# Patient Record
Sex: Female | Born: 1965 | Race: Black or African American | Hispanic: No | Marital: Single | Smoking: Former smoker
Health system: Southern US, Community
[De-identification: ages and names within clinical notes are randomized; demographics above are authoritative.]

## PROBLEM LIST (undated history)

## (undated) DIAGNOSIS — K589 Irritable bowel syndrome without diarrhea: Secondary | ICD-10-CM

## (undated) DIAGNOSIS — F17200 Nicotine dependence, unspecified, uncomplicated: Secondary | ICD-10-CM

## (undated) DIAGNOSIS — K469 Unspecified abdominal hernia without obstruction or gangrene: Secondary | ICD-10-CM

## (undated) DIAGNOSIS — K219 Gastro-esophageal reflux disease without esophagitis: Secondary | ICD-10-CM

## (undated) DIAGNOSIS — F1210 Cannabis abuse, uncomplicated: Secondary | ICD-10-CM

## (undated) DIAGNOSIS — J309 Allergic rhinitis, unspecified: Secondary | ICD-10-CM

## (undated) DIAGNOSIS — F329 Major depressive disorder, single episode, unspecified: Secondary | ICD-10-CM

## (undated) DIAGNOSIS — G8929 Other chronic pain: Secondary | ICD-10-CM

## (undated) DIAGNOSIS — Z970 Presence of artificial eye: Secondary | ICD-10-CM

## (undated) DIAGNOSIS — K649 Unspecified hemorrhoids: Secondary | ICD-10-CM

## (undated) DIAGNOSIS — F32A Depression, unspecified: Secondary | ICD-10-CM

## (undated) DIAGNOSIS — M5410 Radiculopathy, site unspecified: Secondary | ICD-10-CM

## (undated) DIAGNOSIS — G4700 Insomnia, unspecified: Secondary | ICD-10-CM

## (undated) DIAGNOSIS — R109 Unspecified abdominal pain: Secondary | ICD-10-CM

## (undated) DIAGNOSIS — E049 Nontoxic goiter, unspecified: Secondary | ICD-10-CM

## (undated) HISTORY — DX: Unspecified abdominal pain: R10.9

## (undated) HISTORY — DX: Nontoxic goiter, unspecified: E04.9

## (undated) HISTORY — DX: Radiculopathy, site unspecified: M54.10

## (undated) HISTORY — DX: Insomnia, unspecified: G47.00

## (undated) HISTORY — DX: Depression, unspecified: F32.A

## (undated) HISTORY — DX: Nicotine dependence, unspecified, uncomplicated: F17.200

## (undated) HISTORY — DX: Unspecified hemorrhoids: K64.9

## (undated) HISTORY — DX: Allergic rhinitis, unspecified: J30.9

## (undated) HISTORY — DX: Presence of artificial eye: Z97.0

## (undated) HISTORY — DX: Major depressive disorder, single episode, unspecified: F32.9

## (undated) HISTORY — DX: Other chronic pain: G89.29

## (undated) HISTORY — DX: Gastro-esophageal reflux disease without esophagitis: K21.9

## (undated) HISTORY — DX: Cannabis abuse, uncomplicated: F12.10

## (undated) HISTORY — DX: Irritable bowel syndrome without diarrhea: K58.9

---
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
## 1997-04-04 ENCOUNTER — Ambulatory Visit (HOSPITAL_COMMUNITY): Admission: RE | Admit: 1997-04-04 | Discharge: 1997-04-04 | Payer: Self-pay | Admitting: *Deleted

## 1997-06-25 ENCOUNTER — Ambulatory Visit (HOSPITAL_COMMUNITY): Admission: RE | Admit: 1997-06-25 | Discharge: 1997-06-25 | Payer: Self-pay | Admitting: *Deleted

## 1997-07-12 ENCOUNTER — Ambulatory Visit (HOSPITAL_COMMUNITY): Admission: RE | Admit: 1997-07-12 | Discharge: 1997-07-12 | Payer: Self-pay | Admitting: *Deleted

## 1997-09-20 ENCOUNTER — Inpatient Hospital Stay (HOSPITAL_COMMUNITY): Admission: AD | Admit: 1997-09-20 | Discharge: 1997-09-20 | Payer: Self-pay | Admitting: *Deleted

## 1997-09-24 ENCOUNTER — Inpatient Hospital Stay (HOSPITAL_COMMUNITY): Admission: AD | Admit: 1997-09-24 | Discharge: 1997-09-24 | Payer: Self-pay | Admitting: *Deleted

## 1997-09-25 ENCOUNTER — Inpatient Hospital Stay (HOSPITAL_COMMUNITY): Admission: AD | Admit: 1997-09-25 | Discharge: 1997-09-28 | Payer: Self-pay | Admitting: *Deleted

## 1999-11-11 ENCOUNTER — Emergency Department (HOSPITAL_COMMUNITY): Admission: EM | Admit: 1999-11-11 | Discharge: 1999-11-11 | Payer: Self-pay | Admitting: Emergency Medicine

## 2002-07-31 ENCOUNTER — Emergency Department (HOSPITAL_COMMUNITY): Admission: EM | Admit: 2002-07-31 | Discharge: 2002-07-31 | Payer: Self-pay | Admitting: Emergency Medicine

## 2002-07-31 ENCOUNTER — Encounter: Payer: Self-pay | Admitting: Emergency Medicine

## 2003-01-06 ENCOUNTER — Inpatient Hospital Stay (HOSPITAL_COMMUNITY): Admission: AD | Admit: 2003-01-06 | Discharge: 2003-01-06 | Payer: Self-pay | Admitting: Family Medicine

## 2003-01-29 ENCOUNTER — Observation Stay (HOSPITAL_COMMUNITY): Admission: AD | Admit: 2003-01-29 | Discharge: 2003-01-30 | Payer: Self-pay | Admitting: *Deleted

## 2003-01-29 ENCOUNTER — Encounter (INDEPENDENT_AMBULATORY_CARE_PROVIDER_SITE_OTHER): Payer: Self-pay | Admitting: Specialist

## 2003-02-13 ENCOUNTER — Encounter: Admission: RE | Admit: 2003-02-13 | Discharge: 2003-02-13 | Payer: Self-pay | Admitting: Obstetrics and Gynecology

## 2003-02-20 ENCOUNTER — Encounter: Admission: RE | Admit: 2003-02-20 | Discharge: 2003-02-20 | Payer: Self-pay | Admitting: Obstetrics and Gynecology

## 2003-04-23 ENCOUNTER — Encounter: Admission: RE | Admit: 2003-04-23 | Discharge: 2003-04-23 | Payer: Self-pay | Admitting: Obstetrics and Gynecology

## 2003-10-31 ENCOUNTER — Emergency Department (HOSPITAL_COMMUNITY): Admission: EM | Admit: 2003-10-31 | Discharge: 2003-10-31 | Payer: Self-pay | Admitting: Emergency Medicine

## 2003-11-02 ENCOUNTER — Inpatient Hospital Stay (HOSPITAL_COMMUNITY): Admission: AD | Admit: 2003-11-02 | Discharge: 2003-11-02 | Payer: Self-pay | Admitting: Obstetrics & Gynecology

## 2004-09-17 ENCOUNTER — Emergency Department (HOSPITAL_COMMUNITY): Admission: EM | Admit: 2004-09-17 | Discharge: 2004-09-17 | Payer: Self-pay | Admitting: Emergency Medicine

## 2004-09-24 ENCOUNTER — Ambulatory Visit: Payer: Self-pay | Admitting: Internal Medicine

## 2004-10-29 ENCOUNTER — Ambulatory Visit: Payer: Self-pay | Admitting: Internal Medicine

## 2004-10-31 ENCOUNTER — Ambulatory Visit: Payer: Self-pay | Admitting: Internal Medicine

## 2004-11-13 ENCOUNTER — Ambulatory Visit (HOSPITAL_COMMUNITY): Admission: RE | Admit: 2004-11-13 | Discharge: 2004-11-13 | Payer: Self-pay | Admitting: *Deleted

## 2004-12-24 ENCOUNTER — Ambulatory Visit: Payer: Self-pay | Admitting: Internal Medicine

## 2005-01-11 IMAGING — US US PELVIS COMPLETE MODIFY
1 series · 14 of 25 positions shown · non-contrast
Comparison: none

CLINICAL DATA: Pelvic pain.
 TRANSABDOMINAL AND ENDOVAGINAL PELVIC ULTRASOUND:
 Uterus is retroflexed and measures 8.0 x 4.3 x 4.7 cm.  The endometrial stripe is 2 ? 3 mm in thickness.  Myometrial echotexture is homogeneous.  
 The right ovary measures 3.0 x 1.9 x 2.9 cm.  The left ovary measures 2.8 x 1.8 x 2.9 cm.  No free fluid is identified in the cul-de-sac.

[Series 1: us pelvis complete modify · 0.29mm/px · 14 of 55 slices shown]
[im 1/55]
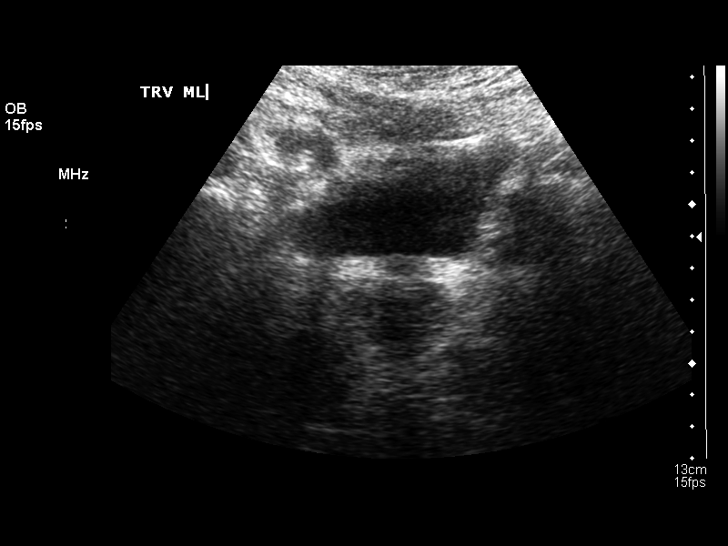
[im 5/55]
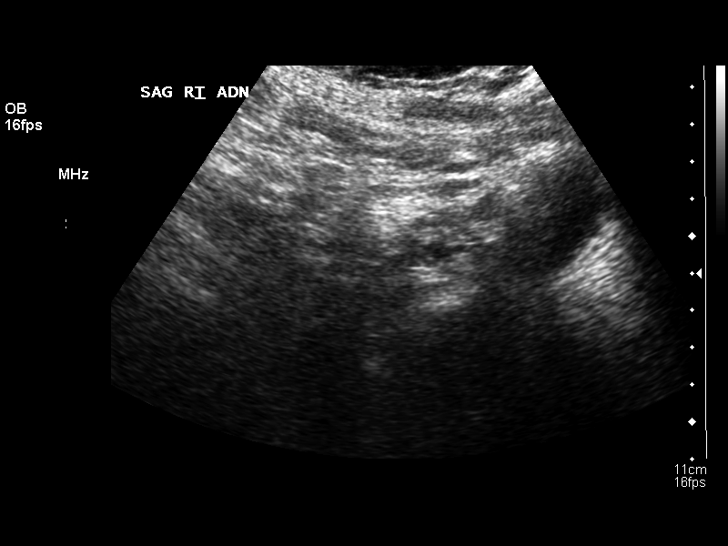
[im 10/55]
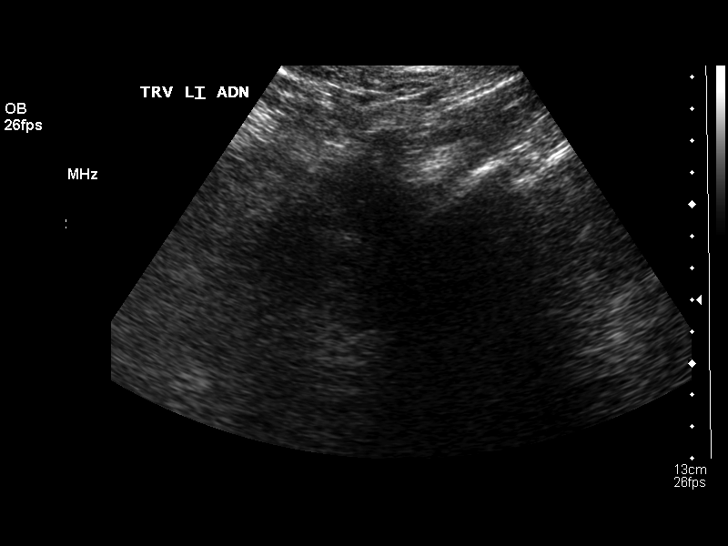
[im 14/55]
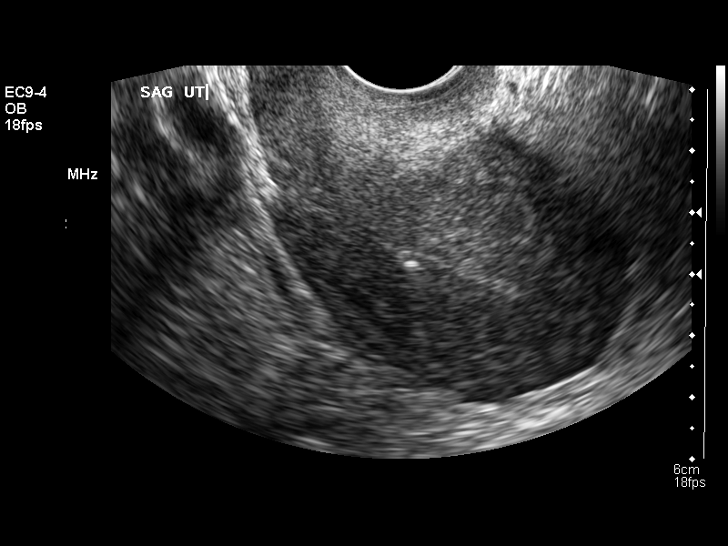
[im 19/55]
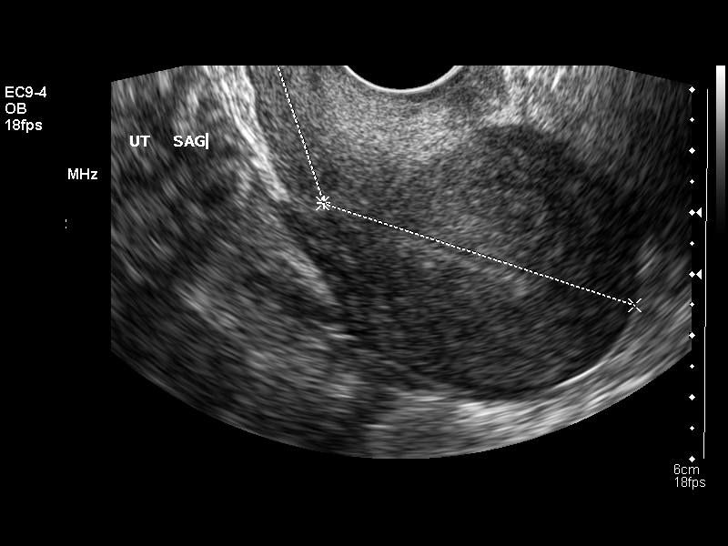
[im 21/55]
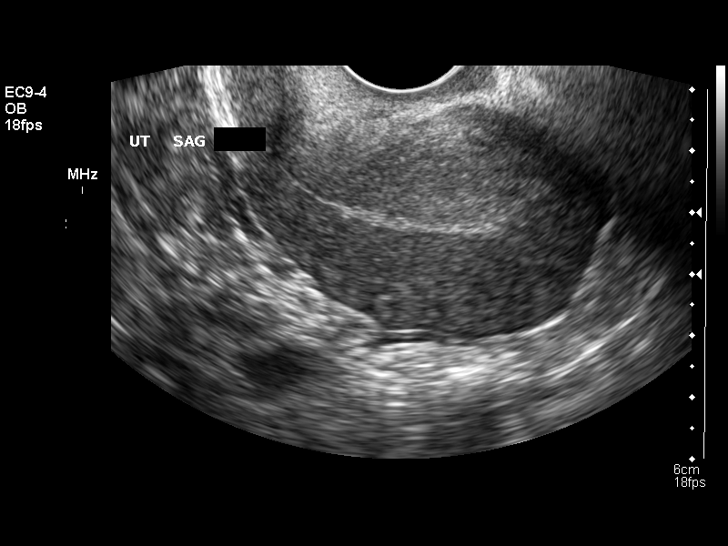
[im 25/55]
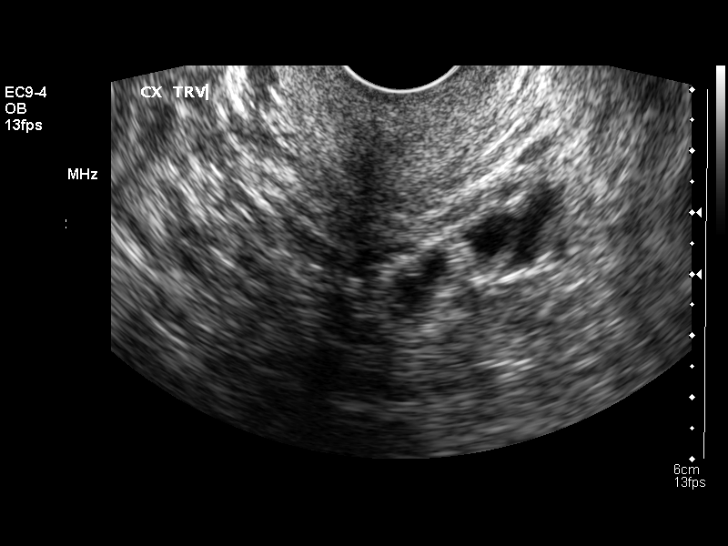
[im 30/55]
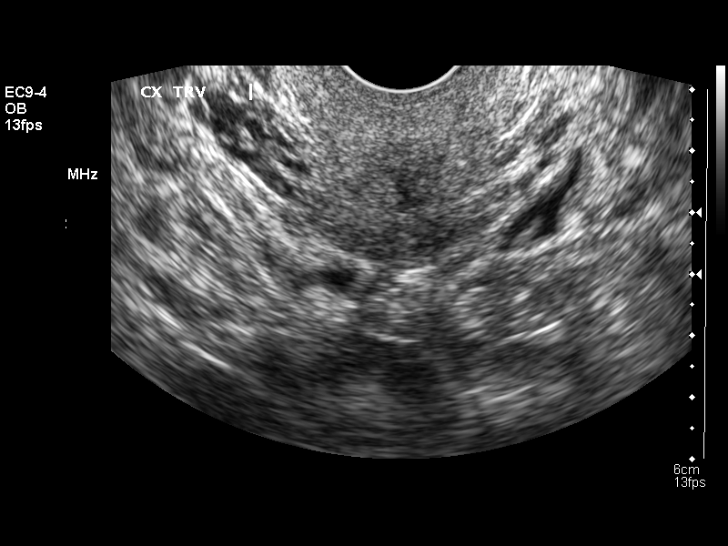
[im 34/55]
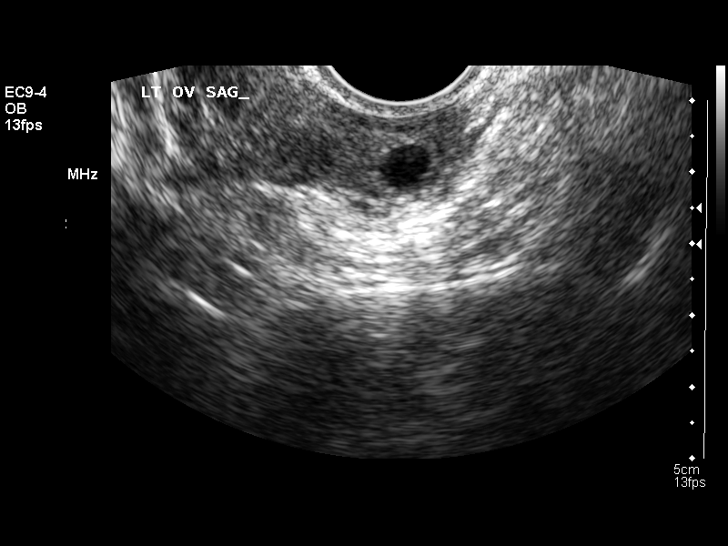
[im 37/55]
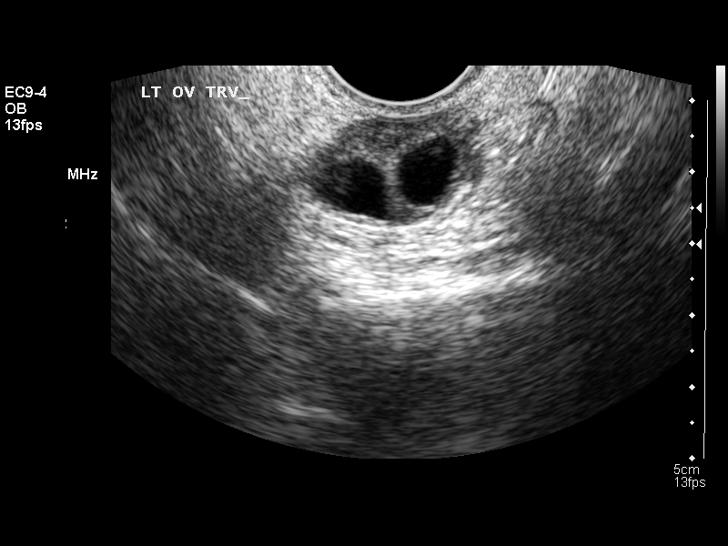
[im 41/55]
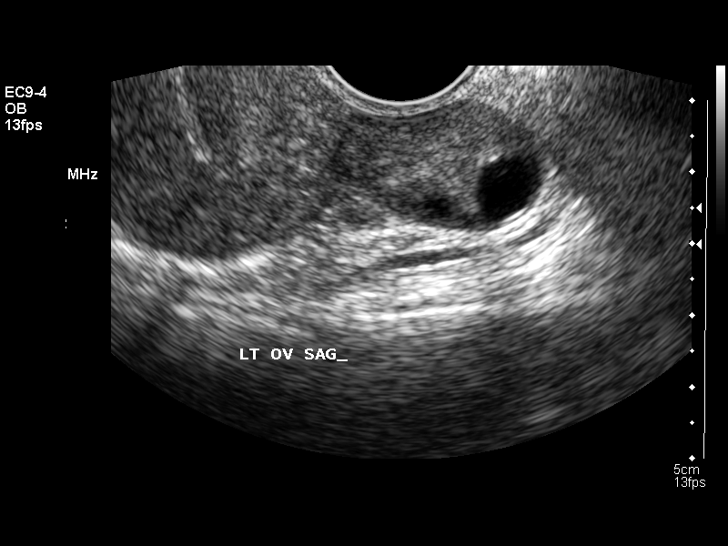
[im 46/55]
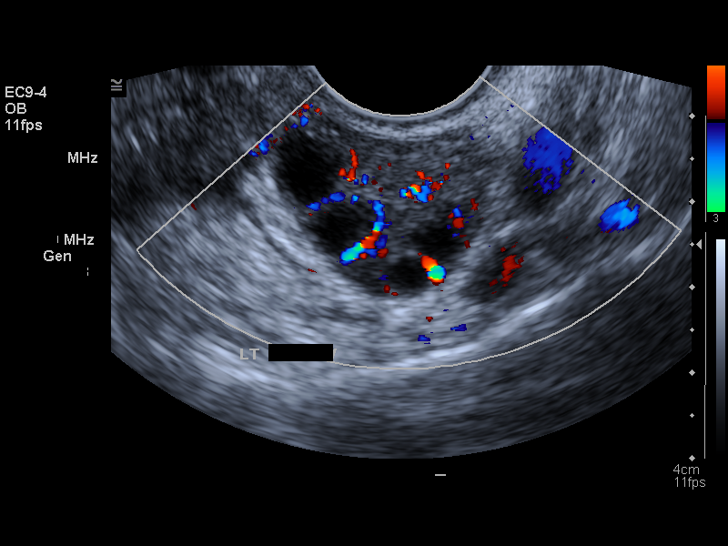
[im 50/55]
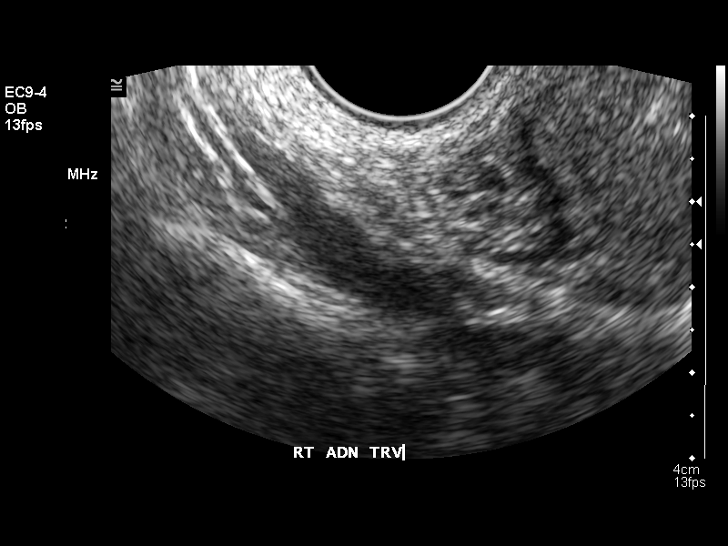
[im 55/55]
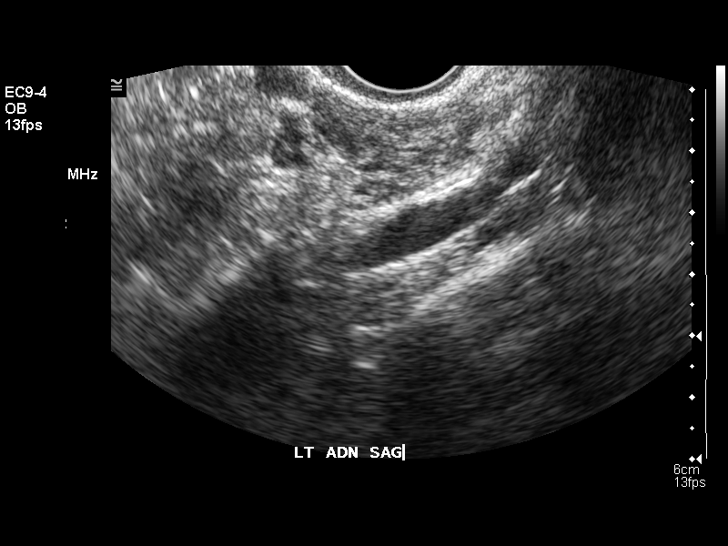

[14 of 25 positions shown; findings below may reference images not displayed]

IMPRESSION: Normal pelvic ultrasound.

## 2005-05-03 ENCOUNTER — Emergency Department (HOSPITAL_COMMUNITY): Admission: EM | Admit: 2005-05-03 | Discharge: 2005-05-03 | Payer: Self-pay | Admitting: Emergency Medicine

## 2005-05-21 ENCOUNTER — Ambulatory Visit: Payer: Self-pay | Admitting: Internal Medicine

## 2005-06-04 ENCOUNTER — Ambulatory Visit: Payer: Self-pay | Admitting: Internal Medicine

## 2005-07-16 ENCOUNTER — Inpatient Hospital Stay (HOSPITAL_COMMUNITY): Admission: AD | Admit: 2005-07-16 | Discharge: 2005-07-16 | Payer: Self-pay | Admitting: Obstetrics and Gynecology

## 2005-07-17 ENCOUNTER — Inpatient Hospital Stay (HOSPITAL_COMMUNITY): Admission: AD | Admit: 2005-07-17 | Discharge: 2005-07-17 | Payer: Self-pay | Admitting: Family Medicine

## 2005-07-25 ENCOUNTER — Inpatient Hospital Stay (HOSPITAL_COMMUNITY): Admission: AD | Admit: 2005-07-25 | Discharge: 2005-07-25 | Payer: Self-pay | Admitting: Obstetrics & Gynecology

## 2005-08-18 ENCOUNTER — Inpatient Hospital Stay (HOSPITAL_COMMUNITY): Admission: AD | Admit: 2005-08-18 | Discharge: 2005-08-18 | Payer: Self-pay | Admitting: Obstetrics and Gynecology

## 2005-08-20 ENCOUNTER — Ambulatory Visit: Payer: Self-pay | Admitting: Obstetrics and Gynecology

## 2005-08-20 ENCOUNTER — Encounter (INDEPENDENT_AMBULATORY_CARE_PROVIDER_SITE_OTHER): Payer: Self-pay | Admitting: Specialist

## 2005-08-20 ENCOUNTER — Ambulatory Visit (HOSPITAL_COMMUNITY): Admission: RE | Admit: 2005-08-20 | Discharge: 2005-08-20 | Payer: Self-pay | Admitting: Obstetrics and Gynecology

## 2005-09-03 ENCOUNTER — Ambulatory Visit: Payer: Self-pay | Admitting: Gynecology

## 2005-11-11 ENCOUNTER — Ambulatory Visit: Payer: Self-pay | Admitting: Internal Medicine

## 2005-11-16 ENCOUNTER — Ambulatory Visit (HOSPITAL_COMMUNITY): Admission: RE | Admit: 2005-11-16 | Discharge: 2005-11-16 | Payer: Self-pay | Admitting: Obstetrics and Gynecology

## 2006-02-03 ENCOUNTER — Ambulatory Visit: Payer: Self-pay | Admitting: Internal Medicine

## 2006-04-28 ENCOUNTER — Ambulatory Visit: Payer: Self-pay | Admitting: Internal Medicine

## 2006-07-22 ENCOUNTER — Ambulatory Visit: Payer: Self-pay | Admitting: Internal Medicine

## 2006-08-05 ENCOUNTER — Ambulatory Visit: Payer: Self-pay | Admitting: Internal Medicine

## 2006-08-05 ENCOUNTER — Encounter (INDEPENDENT_AMBULATORY_CARE_PROVIDER_SITE_OTHER): Payer: Self-pay | Admitting: Internal Medicine

## 2006-08-05 DIAGNOSIS — F329 Major depressive disorder, single episode, unspecified: Secondary | ICD-10-CM

## 2006-08-05 DIAGNOSIS — F3289 Other specified depressive episodes: Secondary | ICD-10-CM

## 2006-08-05 DIAGNOSIS — K219 Gastro-esophageal reflux disease without esophagitis: Secondary | ICD-10-CM

## 2006-08-05 HISTORY — DX: Major depressive disorder, single episode, unspecified: F32.9

## 2006-08-05 HISTORY — DX: Other specified depressive episodes: F32.89

## 2006-08-05 HISTORY — DX: Gastro-esophageal reflux disease without esophagitis: K21.9

## 2006-08-05 LAB — CONVERTED CEMR LAB
AST: 15 units/L (ref 0–37)
Albumin: 5.3 g/dL — ABNORMAL HIGH (ref 3.5–5.2)
Alkaline Phosphatase: 36 units/L — ABNORMAL LOW (ref 39–117)
BUN: 7 mg/dL (ref 6–23)
Basophils Absolute: 0 10*3/uL (ref 0.0–0.1)
Basophils Relative: 0 % (ref 0–1)
Beta hcg, urine, semiquantitative: NEGATIVE
Eosinophils Absolute: 0.1 10*3/uL (ref 0.0–0.7)
HDL: 78 mg/dL (ref >39)
LDH: 134 units/L (ref 94–250)
LDL Cholesterol: 116 mg/dL — ABNORMAL HIGH (ref 0–99)
MCHC: 34 g/dL (ref 30.0–36.0)
MCV: 93.6 fL (ref 78.0–100.0)
Monocytes Relative: 7 % (ref 3–11)
Neutrophils Relative %: 53 % (ref 43–77)
Platelets: 318 10*3/uL (ref 150–400)
Potassium: 4.3 meq/L (ref 3.5–5.3)
RBC: 4.68 M/uL (ref 3.87–5.11)
RDW: 12.5 % (ref 11.5–14.0)
Total CHOL/HDL Ratio: 2.9

## 2006-08-09 ENCOUNTER — Ambulatory Visit (HOSPITAL_COMMUNITY): Admission: RE | Admit: 2006-08-09 | Discharge: 2006-08-09 | Payer: Self-pay | Admitting: *Deleted

## 2006-08-18 ENCOUNTER — Encounter (INDEPENDENT_AMBULATORY_CARE_PROVIDER_SITE_OTHER): Payer: Self-pay | Admitting: Internal Medicine

## 2006-08-18 ENCOUNTER — Other Ambulatory Visit: Admission: RE | Admit: 2006-08-18 | Discharge: 2006-08-18 | Payer: Self-pay | Admitting: Internal Medicine

## 2006-08-18 ENCOUNTER — Ambulatory Visit: Payer: Self-pay | Admitting: Internal Medicine

## 2006-08-18 LAB — CONVERTED CEMR LAB
Candida species: NEGATIVE
Chlamydia, DNA Probe: NEGATIVE
Gardnerella vaginalis: NEGATIVE
Trichomonal Vaginitis: NEGATIVE

## 2006-08-19 ENCOUNTER — Encounter (INDEPENDENT_AMBULATORY_CARE_PROVIDER_SITE_OTHER): Payer: Self-pay | Admitting: Internal Medicine

## 2006-10-29 ENCOUNTER — Ambulatory Visit: Payer: Self-pay | Admitting: Internal Medicine

## 2006-11-10 ENCOUNTER — Emergency Department (HOSPITAL_COMMUNITY): Admission: EM | Admit: 2006-11-10 | Discharge: 2006-11-10 | Payer: Self-pay | Admitting: Emergency Medicine

## 2007-01-05 ENCOUNTER — Telehealth: Payer: Self-pay | Admitting: *Deleted

## 2007-01-18 ENCOUNTER — Ambulatory Visit: Payer: Self-pay | Admitting: Hospitalist

## 2007-01-24 ENCOUNTER — Ambulatory Visit (HOSPITAL_COMMUNITY): Admission: RE | Admit: 2007-01-24 | Discharge: 2007-01-24 | Payer: Self-pay | Admitting: Internal Medicine

## 2007-04-11 ENCOUNTER — Ambulatory Visit: Payer: Self-pay | Admitting: *Deleted

## 2007-04-25 ENCOUNTER — Encounter (INDEPENDENT_AMBULATORY_CARE_PROVIDER_SITE_OTHER): Payer: Self-pay | Admitting: Internal Medicine

## 2007-04-25 ENCOUNTER — Ambulatory Visit: Payer: Self-pay | Admitting: Internal Medicine

## 2007-04-28 ENCOUNTER — Encounter (INDEPENDENT_AMBULATORY_CARE_PROVIDER_SITE_OTHER): Payer: Self-pay | Admitting: Internal Medicine

## 2007-05-24 ENCOUNTER — Encounter (INDEPENDENT_AMBULATORY_CARE_PROVIDER_SITE_OTHER): Payer: Self-pay | Admitting: Internal Medicine

## 2007-06-30 ENCOUNTER — Ambulatory Visit: Payer: Self-pay | Admitting: Internal Medicine

## 2007-09-21 ENCOUNTER — Encounter (INDEPENDENT_AMBULATORY_CARE_PROVIDER_SITE_OTHER): Payer: Self-pay | Admitting: Internal Medicine

## 2007-09-21 ENCOUNTER — Ambulatory Visit: Payer: Self-pay | Admitting: Internal Medicine

## 2007-10-11 ENCOUNTER — Emergency Department (HOSPITAL_COMMUNITY): Admission: EM | Admit: 2007-10-11 | Discharge: 2007-10-11 | Payer: Self-pay | Admitting: Family Medicine

## 2007-11-02 ENCOUNTER — Telehealth: Payer: Self-pay | Admitting: *Deleted

## 2007-12-13 ENCOUNTER — Telehealth: Payer: Self-pay | Admitting: *Deleted

## 2007-12-13 ENCOUNTER — Ambulatory Visit: Payer: Self-pay | Admitting: *Deleted

## 2008-03-06 ENCOUNTER — Ambulatory Visit: Payer: Self-pay | Admitting: Internal Medicine

## 2008-03-08 ENCOUNTER — Ambulatory Visit (HOSPITAL_COMMUNITY): Admission: RE | Admit: 2008-03-08 | Discharge: 2008-03-08 | Payer: Self-pay | Admitting: Internal Medicine

## 2008-03-14 ENCOUNTER — Encounter: Admission: RE | Admit: 2008-03-14 | Discharge: 2008-03-14 | Payer: Self-pay | Admitting: Internal Medicine

## 2008-04-17 ENCOUNTER — Ambulatory Visit: Payer: Self-pay | Admitting: Internal Medicine

## 2008-04-17 DIAGNOSIS — F1210 Cannabis abuse, uncomplicated: Secondary | ICD-10-CM

## 2008-04-17 HISTORY — DX: Cannabis abuse, uncomplicated: F12.10

## 2008-04-23 ENCOUNTER — Encounter (INDEPENDENT_AMBULATORY_CARE_PROVIDER_SITE_OTHER): Payer: Self-pay | Admitting: Internal Medicine

## 2008-04-23 ENCOUNTER — Telehealth: Payer: Self-pay | Admitting: Licensed Clinical Social Worker

## 2008-04-26 ENCOUNTER — Telehealth (INDEPENDENT_AMBULATORY_CARE_PROVIDER_SITE_OTHER): Payer: Self-pay | Admitting: Internal Medicine

## 2008-05-08 ENCOUNTER — Telehealth: Payer: Self-pay | Admitting: Licensed Clinical Social Worker

## 2008-07-06 ENCOUNTER — Encounter (INDEPENDENT_AMBULATORY_CARE_PROVIDER_SITE_OTHER): Payer: Self-pay | Admitting: Internal Medicine

## 2008-07-06 ENCOUNTER — Ambulatory Visit: Payer: Self-pay | Admitting: Internal Medicine

## 2008-07-06 ENCOUNTER — Other Ambulatory Visit: Admission: RE | Admit: 2008-07-06 | Discharge: 2008-07-06 | Payer: Self-pay | Admitting: Internal Medicine

## 2008-07-06 DIAGNOSIS — F17200 Nicotine dependence, unspecified, uncomplicated: Secondary | ICD-10-CM

## 2008-07-06 DIAGNOSIS — Z87891 Personal history of nicotine dependence: Secondary | ICD-10-CM

## 2008-07-06 HISTORY — DX: Nicotine dependence, unspecified, uncomplicated: F17.200

## 2008-07-06 LAB — CONVERTED CEMR LAB
ALT: 9 units/L (ref 0–35)
CO2: 23 meq/L (ref 19–32)
Calcium: 8.8 mg/dL (ref 8.4–10.5)
Chloride: 107 meq/L (ref 96–112)
GFR calc Af Amer: >60 mL/min (ref >60)
Sodium: 141 meq/L (ref 135–145)
Total CHOL/HDL Ratio: 2.4
Total Protein: 6.8 g/dL (ref 6.0–8.3)
VLDL: 29 mg/dL (ref 0–40)

## 2008-07-07 LAB — CONVERTED CEMR LAB: Gardnerella vaginalis: POSITIVE — AB

## 2008-08-17 ENCOUNTER — Ambulatory Visit: Payer: Self-pay | Admitting: Internal Medicine

## 2008-08-17 DIAGNOSIS — H10529 Angular blepharoconjunctivitis, unspecified eye: Secondary | ICD-10-CM

## 2008-08-23 ENCOUNTER — Telehealth: Payer: Self-pay | Admitting: Licensed Clinical Social Worker

## 2008-09-07 ENCOUNTER — Ambulatory Visit: Payer: Self-pay | Admitting: Internal Medicine

## 2008-10-15 ENCOUNTER — Ambulatory Visit: Payer: Self-pay | Admitting: Infectious Diseases

## 2008-10-15 DIAGNOSIS — G4700 Insomnia, unspecified: Secondary | ICD-10-CM

## 2008-10-15 HISTORY — DX: Insomnia, unspecified: G47.00

## 2008-10-15 LAB — CONVERTED CEMR LAB
Leukocytes, UA: NEGATIVE
Nitrite: NEGATIVE
Protein, ur: NEGATIVE mg/dL
Urine Glucose: NEGATIVE mg/dL

## 2009-02-08 LAB — HM MAMMOGRAPHY: HM Mammogram: NEGATIVE

## 2009-03-11 ENCOUNTER — Ambulatory Visit (HOSPITAL_COMMUNITY): Admission: RE | Admit: 2009-03-11 | Discharge: 2009-03-11 | Payer: Self-pay | Admitting: Internal Medicine

## 2009-06-04 ENCOUNTER — Ambulatory Visit: Payer: Self-pay | Admitting: Infectious Disease

## 2009-07-25 ENCOUNTER — Ambulatory Visit: Payer: Self-pay | Admitting: Internal Medicine

## 2009-08-05 ENCOUNTER — Telehealth (INDEPENDENT_AMBULATORY_CARE_PROVIDER_SITE_OTHER): Payer: Self-pay | Admitting: *Deleted

## 2009-08-06 ENCOUNTER — Ambulatory Visit: Payer: Self-pay | Admitting: Internal Medicine

## 2009-08-06 DIAGNOSIS — K589 Irritable bowel syndrome without diarrhea: Secondary | ICD-10-CM

## 2009-08-06 HISTORY — DX: Irritable bowel syndrome, unspecified: K58.9

## 2009-08-07 ENCOUNTER — Telehealth: Payer: Self-pay | Admitting: Internal Medicine

## 2009-08-08 ENCOUNTER — Telehealth: Payer: Self-pay | Admitting: *Deleted

## 2009-09-25 ENCOUNTER — Telehealth: Payer: Self-pay | Admitting: *Deleted

## 2009-10-10 ENCOUNTER — Ambulatory Visit: Payer: Self-pay | Admitting: Internal Medicine

## 2009-10-10 DIAGNOSIS — J309 Allergic rhinitis, unspecified: Secondary | ICD-10-CM | POA: Insufficient documentation

## 2009-10-10 DIAGNOSIS — R634 Abnormal weight loss: Secondary | ICD-10-CM | POA: Insufficient documentation

## 2009-10-10 DIAGNOSIS — R1084 Generalized abdominal pain: Secondary | ICD-10-CM | POA: Insufficient documentation

## 2009-10-10 HISTORY — DX: Allergic rhinitis, unspecified: J30.9

## 2009-10-11 ENCOUNTER — Ambulatory Visit: Payer: Self-pay | Admitting: Internal Medicine

## 2009-10-12 ENCOUNTER — Encounter: Payer: Self-pay | Admitting: Internal Medicine

## 2009-10-16 ENCOUNTER — Encounter: Payer: Self-pay | Admitting: Internal Medicine

## 2009-10-16 DIAGNOSIS — E049 Nontoxic goiter, unspecified: Secondary | ICD-10-CM

## 2009-10-16 HISTORY — DX: Nontoxic goiter, unspecified: E04.9

## 2009-10-16 LAB — CONVERTED CEMR LAB
CO2: 24 meq/L (ref 19–32)
Calcium: 9.7 mg/dL (ref 8.4–10.5)
Chloride: 105 meq/L (ref 96–112)
Eosinophils Absolute: 0 10*3/uL (ref 0.0–0.7)
Glucose, Bld: 82 mg/dL (ref 70–99)
Lymphocytes Relative: 27 % (ref 12–46)
Lymphs Abs: 1.9 10*3/uL (ref 0.7–4.0)
MCV: 94.7 fL (ref >=78.0)
Monocytes Relative: 9 % (ref 3–12)
Neutro Abs: 4.5 10*3/uL (ref 1.7–7.7)
Neutrophils Relative %: 64 % (ref 43–77)
RBC: 4.18 M/uL (ref 3.87–5.11)
Sodium: 140 meq/L (ref 135–145)
Total Bilirubin: 0.4 mg/dL (ref 0.3–1.2)
Total Protein: 6.7 g/dL (ref 6.0–8.3)
WBC: 7.1 10*3/uL (ref 4.0–10.5)

## 2009-10-21 ENCOUNTER — Telehealth: Payer: Self-pay | Admitting: *Deleted

## 2009-10-21 ENCOUNTER — Ambulatory Visit: Payer: Self-pay | Admitting: Internal Medicine

## 2009-10-21 LAB — CONVERTED CEMR LAB: Free T4: 1.04 ng/dL (ref 0.80–1.80)

## 2009-10-23 ENCOUNTER — Telehealth: Payer: Self-pay | Admitting: Internal Medicine

## 2009-10-23 ENCOUNTER — Ambulatory Visit (HOSPITAL_COMMUNITY): Admission: RE | Admit: 2009-10-23 | Discharge: 2009-10-23 | Payer: Self-pay | Admitting: Internal Medicine

## 2009-10-30 ENCOUNTER — Ambulatory Visit: Payer: Self-pay | Admitting: Internal Medicine

## 2009-11-05 ENCOUNTER — Encounter: Admission: RE | Admit: 2009-11-05 | Discharge: 2009-11-05 | Payer: Self-pay | Admitting: Family Medicine

## 2009-11-05 ENCOUNTER — Other Ambulatory Visit: Admission: RE | Admit: 2009-11-05 | Discharge: 2009-11-05 | Payer: Self-pay | Admitting: Diagnostic Radiology

## 2009-11-27 ENCOUNTER — Telehealth (INDEPENDENT_AMBULATORY_CARE_PROVIDER_SITE_OTHER): Payer: Self-pay | Admitting: *Deleted

## 2009-11-28 ENCOUNTER — Ambulatory Visit: Payer: Self-pay | Admitting: Internal Medicine

## 2009-11-28 DIAGNOSIS — R070 Pain in throat: Secondary | ICD-10-CM

## 2010-01-09 ENCOUNTER — Ambulatory Visit: Payer: Self-pay | Admitting: Internal Medicine

## 2010-01-27 ENCOUNTER — Telehealth: Payer: Self-pay | Admitting: *Deleted

## 2010-02-09 ENCOUNTER — Encounter: Payer: Self-pay | Admitting: Internal Medicine
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
## 2010-02-18 NOTE — Progress Notes (Signed)
Summary: Megace Rx  Phone Note Outgoing Call   Summary of Call: Prior Authorization required for Megace ES 625mg /30ml; [employer] (Medicaid) called.  They stated generic  Megestrol Acetate does not required a prior authorization.  Please electronic fax new Rx to [facility]. Thanks Initial call taken by: Chinita Pester RN,  October 23, 2009 3:54 PM  Follow-up for Phone Call        Megace Acetate Rx sent electronically to [facility].  Follow-up by: Shelly Kalia-Reynolds, D.O.     New/Updated Medications: MEGACE ORAL 40 MG/ML SUSP (MEGESTROL ACETATE) One teaspoon by mouth daily Prescriptions: MEGACE ORAL 40 MG/ML SUSP (MEGESTROL ACETATE) One teaspoon by mouth daily  #100 mL x 0   Entered and Authorized by:   Johnette Abraham DO   Signed by:   Johnette Abraham DO on 10/25/2009   Method used:   Electronically to        [facility] [id number] (retail)       [address] [place], [place]  [address]       Ph: [phone]       Fax: [phone]   RxID:   [id number]

## 2010-02-18 NOTE — Progress Notes (Signed)
Summary: Appointment/prescription  Phone Note Outgoing Call   Call placed by: Angelina Ok RN,  October 21, 2009 2:24 PM Call placed to: Patient Summary of Call: Call to pt to remind her of the U/S appointment for 10/23/2009.  Pt also has a prescription for Megace waiting for pick up at [facility].   Message left for the pt to call the Clinics as soon as possible. Angelina Ok RN  October 21, 2009 2:25 PM Pt came to Clinics for labs as ordered and was given appointment and told about prescription for Megace. October 3,2011 2:30 PM
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
## 2010-02-18 NOTE — Assessment & Plan Note (Signed)
Summary: REASSIGNED NEW TO DR/CFB   Vital Signs:  Patient profile:   45 year old female Height:      67 inches (170.18 cm) Weight:      151.8 pounds (69 kg) BMI:     23.86 Temp:     98.8 degrees F (37.11 degrees C) oral Pulse rate:   81 / minute BP sitting:   108 / 69  (right arm)  Vitals Entered By: Stanton Kidney Ditzler RN (July 25, 2009 3:01 PM) Is Patient Diabetic? No Pain Assessment Patient in pain? yes     Location: abdomen Intensity: 5 Type: cramps Onset of pain  pd time Nutritional Status BMI of 19 -24 = normal Nutritional Status Detail appetite down  Have you ever been in a relationship where you felt threatened, hurt or afraid?denies   Does patient need assistance? Functional Status Self care Ambulation Normal Comments To meet  Dr Claudette Laws and discuss meds. Off meds past week.   Primary Provider:  Joaquin Courts  MD   History of Present Illness: Pt is  45 y/o woman with P/H of Depression, GERD, Insomnia.  She comes to the clinic for discussing about her medication which she is on for her problems. She stopped taking her medicines 2to 3 weeks before.  She was on  1)Omeprazole 30 mg 1 tab 2 times a day, but she says she was taking it 2 tab 2 times a day.                      2) Zolpidem 5mg  - once at bedtime                      3) Mirtazapine 30mg - 1 tab 2 times aday.  She stopped the medicines as she was having weird, bad dreams which woke her up at middle of night and then she was not able to sleep.  She smoked cigarettes between 45 and 68 years of age. Then she quit and again restarted about 3 weeks back. And since then her GERD has become more severe. She is really motivated to quit smoking as she believes that it made her GERD worse,and also her mother died of Breast cancer at an early age.  She doesn't felt depressed today. She was in a good mood and also said that she is trying very hard not to break down with the social stress in her life.  She denied any  fever, nausea, vomitting, hemoptysis, chest pain, abd pain, urinary and bowel abnormalities.    Depression History:      The patient denies a depressed mood most of the day and a diminished interest in her usual daily activities.         Habits & Providers  Alcohol-Tobacco-Diet     Alcohol drinks/day: 0     Tobacco Status: current     Cigarette Packs/Day: smokes marijuana + 6 cigs per day     Year Started: daily     Year Quit: QUIT 1 WEEK  Exercise-Depression-Behavior     Does Patient Exercise: no     Drug Use: yes     Seat Belt Use: 100  Medications Prior to Update: 1)  Omeprazole 20 Mg Cpdr (Omeprazole) .... Take 1 Tablet By Mouth Two Times A Day 2)  Mirtazapine 30 Mg Tabs (Mirtazapine) .... Take 1 Tablet By Mouth Once A Day 3)  Zolpidem Tartrate 5 Mg Tabs (Zolpidem Tartrate) .... Take 1 Tab By Mouth  At Bedtime  Current Medications (verified): 1)  Omeprazole 20 Mg Cpdr (Omeprazole) .... Take 1 Tablet By Mouth Two Times A Day 2)  Zolpidem Tartrate 5 Mg Tabs (Zolpidem Tartrate) .... Take 1 Tab By Mouth At Bedtime 3)  Buproban 150 Mg Xr12h-Tab (Bupropion Hcl (Smoking Deter)) .... Take 1 Tab 2 Times A Day By Mouth  Allergies (verified): No Known Drug Allergies  Social History: Smoking Status:  current Packs/Day:  smokes marijuana + 6 cigs per day  Review of Systems       as per HPI  Physical Exam  General:  alert, well-developed, and well-nourished.   Head:  normocephalic and atraumatic.   Neck:  no thyromegaly. Lungs:  normal respiratory effort, no intercostal retractions, no accessory muscle use, and normal breath sounds.   Heart:  normal rate, regular rhythm, and no murmur.   Abdomen:  soft, non-tender, and normal bowel sounds.  soft, non-tender, and normal bowel sounds.     Impression & Recommendations:  Problem # 1:  DEPRESSION (ICD-311) Assessment Comment Only She doesn't felt depressed today. She was in a good mood and also said that she is trying very  hard not to break down with the social stress in her life. She wanted to change her current med Mirtazapine to any other. So we changed her med to Bupropion 150 mg 2 times a day as it will help her to quit smoking also, which she really wanted to be helped with today. Will f/u her in 3 weeks and assess her then. The following medications were removed from the medication list:    Mirtazapine 30 Mg Tabs (Mirtazapine) .Marland Kitchen... Take 1 tablet by mouth once a day  Problem # 2:  NICOTINE ADDICTION (ICD-305.1) Assessment: Comment Only She smoked cigarettes between 45 and 61 years of age. Then she quit and again restarted about 3 weeks back.  She is really motivated to quit smoking as she believes that it made her GERD worse,and also her mother died of Breast cancer at an early age. So we started her on Bupropion as mentioned above. Will f/u in 3 weeks.  Her updated medication list for this problem includes:    Buproban 150 Mg Xr12h-tab (Bupropion hcl (smoking deter)) .Marland Kitchen... Take 1 tab 2 times a day by mouth  Problem # 3:  INSOMNIA (ICD-780.52) She was on Zolpidem, butwas not taking it for last 3 weeks. Advised her to continue to take it 1 tab 5mg  at bedtime. Her updated medication list for this problem includes:    Zolpidem Tartrate 5 Mg Tabs (Zolpidem tartrate) .Marland Kitchen... Take 1 tab by mouth at bedtime  Problem # 4:  GERD (ICD-530.81) Her GERD got worse since last 3 weeks. She was taking Omeprazole 30mg  2 tab 2 times a day, even though she was prescribed to take 1 tab 2 times a day.  So explained her to take only 1 tab two times a day and also quit smoking will help this. Her updated medication list for this problem includes:    Omeprazole 20 Mg Cpdr (Omeprazole) .Marland Kitchen... Take 1 tablet by mouth two times a day  Complete Medication List: 1)  Omeprazole 20 Mg Cpdr (Omeprazole) .... Take 1 tablet by mouth two times a day 2)  Zolpidem Tartrate 5 Mg Tabs (Zolpidem tartrate) .... Take 1 tab by mouth at bedtime 3)   Buproban 150 Mg Xr12h-tab (Bupropion hcl (smoking deter)) .... Take 1 tab 2 times a day by mouth  Patient Instructions: 1)  Please  schedule a follow-up appointment in 3 weeks with Dr. Allena Katz. 2)  1) We are changing your anti-depression medication from Mirtazapine to Bupropion.  3)   This willl help your depression and also will help you quit smoking. 4)  Take it 1 tablet two times a day by mouth. 5)  2) Decide a non-smoking day after giving it a thought. And  6)  follow he instructions as Dr. Aundria Rud explained you to decide for the 'QUIT DATE'.  7)  Try to get rid of everything which reminds you of having a cigarrete, for example,- ash tray 8)                                      - any specific place, etc. 9)  Good luck for your try and we believe you will as you are really highly motivated. 10)  3) For your acid reflux, please continue the same med Omeprazole, but just take 1 tablet 2 times a day. No more than that. 11)  Also quitting smoking will help with your acid reflux and many other things.  Prescriptions: OMEPRAZOLE 20 MG CPDR (OMEPRAZOLE) Take 1 tablet by mouth two times a day  #60 x 2   Entered and Authorized by:   Lyn Hollingshead   Signed by:   Lyn Hollingshead on 07/25/2009   Method used:   Electronically to        [facility] [id number] (retail)       [address] [place], [place]  [address]       Ph: [phone]       Fax: [phone]   RxID:   [id number] ZOLPIDEM TARTRATE 5 MG TABS (ZOLPIDEM TARTRATE) Take 1 tab by mouth at bedtime  #30 x 2   Entered and Authorized by:   Lyn Hollingshead   Signed by:   Lyn Hollingshead on 07/25/2009   Method used:   Print then Give to Patient   RxID:   [id number] BUPROBAN 150 MG XR12H-TAB (BUPROPION HCL (SMOKING DETER)) take 1 tab 2 times a day by mouth  #60 x 0   Entered and Authorized by:   Lyn Hollingshead   Signed by:   Lyn Hollingshead on 07/25/2009   Method used:   Print then Give to Patient   RxID:    [id number]    Prevention & Chronic Care Immunizations   Influenza vaccine: Not documented   Influenza vaccine deferral: Refused  (10/15/2008)    Tetanus booster: Not documented    Pneumococcal vaccine: Not documented  Other Screening   Pap smear: NEGATIVE FOR INTRAEPITHELIAL LESIONS OR MALIGNANCY.  (07/06/2008)   Pap smear due: 07/06/2009    Mammogram: ASSESSMENT: Negative - BI-RADS 1^MM DIGITAL SCREENING  (03/11/2009)   Mammogram due: 03/14/2009   Smoking status: current  (07/25/2009)  Diabetes Mellitus   HgbA1C: Not documented    Eye exam: Not documented    Foot exam: Not documented   High risk foot: Not documented   Foot care education: Not documented    Urine microalbumin/creatinine ratio: Not documented  Lipids   Total Cholesterol: 171  (07/06/2008)   LDL: 72  (07/06/2008)   LDL Direct: Not documented   HDL: 70  (07/06/2008)   Triglycerides: 147  (07/06/2008)  Self-Management Support :    Diabetes self-management support: Resources for  patients handout  (07/25/2009)      Resource handout printed.

## 2010-02-18 NOTE — Progress Notes (Signed)
Summary: refill/gg  Phone Note Refill Request  on September 25, 2009 5:02 PM  Refills Requested: Medication #1:  CLONAZEPAM 0.5 MG TBDP Take 1 tablet by mouth two times a day   Last Refilled: 08/06/2009  Method Requested: Fax to Local Pharmacy Initial call taken by: Merrie Roof RN,  September 25, 2009 5:03 PM  Follow-up for Phone Call        Refill approved-nurse to complete.  Please schedule an appointment within 1 month. Follow-up by: Margarito Liner MD,  September 26, 2009 8:58 AM  Additional Follow-up for Phone Call Additional follow up Details #1::        Rx faxed to pharmacy Additional Follow-up by: Merrie Roof RN,  September 26, 2009 10:03 AM    Prescriptions: CLONAZEPAM 0.5 MG TBDP (CLONAZEPAM) Take 1 tablet by mouth two times a day  #60 x 0   Entered and Authorized by:   Margarito Liner MD   Signed by:   Margarito Liner MD on 09/26/2009   Method used:   Telephoned to ...       [facility] [id number] (retail)       [address] [place], [place]  [address]       Ph: [phone]       Fax: [phone]   RxID:   [id number]

## 2010-02-18 NOTE — Assessment & Plan Note (Signed)
Summary: Decreased appetite, nasal congestion/ HA, Depression-PCP [facility]   Vital Signs:  Patient profile:   45 year old female Height:      67 inches (170.18 cm) Weight:      134 pounds (60.91 kg) BMI:     21.06 Temp:     98.8 degrees F (37.11 degrees C) oral Pulse rate:   79 / minute BP sitting:   104 / 67  (left arm) Cuff size:   regular  Vitals Entered By: Theotis Barrio NT II (October 10, 2009 4:12 PM) CC: Depression, low appetite, headache Is Patient Diabetic? No Pain Assessment Patient in pain? yes     Location: HEADACHE Intensity:       8 Onset of pain  FOR ABOUT A WEEK Nutritional Status BMI of 19 -24 = normal  Have you ever been in a relationship where you felt threatened, hurt or afraid?No   Does patient need assistance? Functional Status Self care Ambulation Normal   Primary Care Alisan Dokes:  Danelle Berry, MD  CC:  Depression, low appetite, and headache.  History of Present Illness: Pt is a 45 yo female with PMHx of Depression, chronic abdominal distention/bloating, hx of miscarriages who presents to clinic today with multiple medical concerns, including the following:  1) Depression  - pt indicates "long" history of depression. Per chart review, it seems pt has been tried on multiple depression medications since 2008, including Effexor, Amitriptyline, Remeron, Trazodone, Wellbutrin all with resultant side effects of decreased appetite, vomiting, diarrhea, weakness. Did not have good symptom control with these medications, mild control on Remeron. Currently confirms feeling of insomnia, fatigue, difficulty concentrating. However, states would never consider hurting herself or others.   2) Abdominal pain/ Weight loss - She reports mid-abdominal crampy pain that is 4/10 that feels different than her menstraul pain. With associated decreased appetite (eating once daily) and abdominal bloating, also for a "long" time. Notes decreased stimulation response to food and   especially since tried on Wellbutrin in 07/2009. Confirms unintentional weight loss of 13 pounds since 07/2009, with increased stress and decreased appetite during this time No alleviating or aggravating factors. No fevers, chills, or vomiting. Last mammogram 02/11 was normal, last PAP 06/2008 was normal. CT Abdomen and Pelvis 07/08 neg, EGD 05/09- Erythematous gastric mucosa-bx w/ chronic gastritis-neg h. pylori, Duodenal  polyp-path c/w peptic duodenitis, Colonoscopy 05/09: Polyp at hepatic flexure- path c/w tubular adenoma.  3) Allergic rhinitis/ sinusitis - pt describes itchy, watery eyes, nasal congestion. Associated intermittent frontal headache for 1-2 weeks. No fevers, no chills, no sick contacts, no sore throat, no ear pain, no shortness of breath, no difficulty breathing.    Depression History:      Positive alarm features for depression include significant weight loss, insomnia, fatigue (loss of energy), feelings of worthlessness (guilt), and impaired concentration (indecisiveness).         Preventive Screening-Counseling & Management  Alcohol-Tobacco     Alcohol drinks/day: 0     Smoking Status: current     Packs/Day: smokes marijuana + 6 cigs per day     Year Started: daily     Year Quit: QUIT 1 WEEK  Caffeine-Diet-Exercise     Does Patient Exercise: no  Current Medications (verified): 1)  Zolpidem Tartrate 5 Mg Tabs (Zolpidem Tartrate) .... Take 1 Tab By Mouth At Bedtime 2)  Clonazepam 0.5 Mg Tbdp (Clonazepam) .... Take 1 Tablet By Mouth Two Times A Day 3)  Nexium 40 Mg Cpdr (Esomeprazole Magnesium) .... Take 1  Capsule By Mouth Once A Day  Allergies: No Known Drug Allergies  Past History:  Past Medical History: 2 miscarriages Depression Chronic abdominal distension/bloating w/ workup including:            1.  CT Abdomen and Pelvis 07/08 neg            2.  EGD 05/09 by Dr. Evette Cristal: Erythematous gastric mucosa-bx w/ chronic gastritis-neg h. pylori, Duodenal  polyp-path  c/w peptic duodenitis            3.  Colonoscopy 05/09: Polyp at hepatic flexure- path c/w tubular adenoma  Review of Systems       per HPI  Physical Exam  General:  Vital signs reviewed and noted. Well-developed,well-nourished,in no acute distress; alert,appropriate and cooperative throughout examination. Head: normocephalic, atraumatic. No TTP over maxillary or frontal sinuses. Ears: TM nonerythematous and without retractions or bulging. Mouth: No oropharyngeal erythema or exudates appreciated. Nose: inflammation of nasal mucosa with clear rhinnorhea. Neck: No  tenderness noted, fullness over left thyroid gland. No cervical lymphadenopathy appreciated. Lungs: Normal respiratory effort. Clear to auscultation BL without crackles or wheezes.  Heart: RRR. S1 and S2 normal without gallop, murmur, or rubs.  Abdomen: BS normoactive. Soft, Nondistended, diffuse mild TTP.  No masses or organomegaly. Extremities: No pretibial edema.     Impression & Recommendations:  Problem # 1:  WEIGHT LOSS (ICD-783.21) Assessment Unchanged Likely 2/2 to decreased appetite and decreased food intake - etiology of which is unclear. Mammogram 2010 was normal, PAP 06/2008 was normal. On exam, there was some appreciable fullness over left thyroid.  - Will check TSH, consider free T4 - Will check CBC, CMP to assess indication of infection, renal or liver abnormalities.  Orders: T-CBC w/Diff ([id number]) T-TSH [phone]) T-CMP with Estimated GFR ([id number])  Problem # 2:  GOITER, UNSPECIFIED (ICD-240.9) Assessment: Unchanged fullness over left thyroid gland. - Will check TSH  Problem # 3:  DEPRESSION (ICD-311) Given patient's significant sensitivities to antidepressive medications, and denial or SI or HI at this time, will not start new medication, until we can get better understanding of cause of her presenting symptoms. - Continue current managment.  - Will have close follow-up and reassess next  visit  Her updated medication list for this problem includes:    Clonazepam 0.5 Mg Tbdp (Clonazepam) .Marland Kitchen... Take 1 tablet by mouth two times a day  Problem # 4:  ALLERGIC RHINITIS (ICD-477.9) Assessment: New Likely allergic rhinitis/ sinusitis  - Start flonase, loratadine daily  Her updated medication list for this problem includes:    Flonase 50 Mcg/act Susp (Fluticasone propionate) ..... One puff each nostril once daily    Loratadine 10 Mg Tabs (Loratadine) .Marland Kitchen... Take 1 tablet by mouth once a day  Problem # 5:  ABDOMINAL PAIN, GENERALIZED (ICD-789.07) Assessment: Unchanged - Will start Megace for appetite stimulation for short time, with close follow-up after lab results return  Complete Medication List: 1)  Zolpidem Tartrate 5 Mg Tabs (Zolpidem tartrate) .... Take 1 tab by mouth at bedtime 2)  Clonazepam 0.5 Mg Tbdp (Clonazepam) .... Take 1 tablet by mouth two times a day 3)  Nexium 40 Mg Cpdr (Esomeprazole magnesium) .... Take 1 capsule by mouth once a day 4)  Flonase 50 Mcg/act Susp (Fluticasone propionate) .... One puff each nostril once daily 5)  Loratadine 10 Mg Tabs (Loratadine) .... Take 1 tablet by mouth once a day 6)  Megace Es 625 Mg/78ml Susp (Megestrol acetate) .Marland Kitchen.. 1 teaspoon by mouth daily  Patient Instructions: 1)  Follow-up within 1-2 weeks. Prescriptions: LORATADINE 10 MG TABS (LORATADINE) Take 1 tablet by mouth once a day  #30 x 3   Entered and Authorized by:   Johnette Abraham DO   Signed by:   Johnette Abraham DO on 10/10/2009   Method used:   Print then Give to Patient   RxID:   [id number] FLONASE 50 MCG/ACT SUSP (FLUTICASONE PROPIONATE) one puff each nostril once daily  #1 bottle x 2   Entered and Authorized by:   Johnette Abraham DO   Signed by:   Johnette Abraham DO on 10/10/2009   Method used:   Print then Give to Patient   RxID:   [id number]  Process Orders Check Orders Results:     [facility]: ABN  not required for this insurance Tests Sent for requisitioning (October 21, 2009 1:20 PM):     10/10/2009: [facility] -- T-CBC w/Diff [[id number]] (signed)     10/10/2009: [facility] -- T-TSH [phone] (signed)     10/10/2009: [facility] -- T-CMP with Estimated GFR [[id number]] (signed)     Prevention & Chronic Care Immunizations   Influenza vaccine: Not documented   Influenza vaccine deferral: Refused  (10/15/2008)    Tetanus booster: Not documented    Pneumococcal vaccine: Not documented  Other Screening   Pap smear: NEGATIVE FOR INTRAEPITHELIAL LESIONS OR MALIGNANCY.  (07/06/2008)   Pap smear due: 07/06/2009    Mammogram: ASSESSMENT: Negative - BI-RADS 1^MM DIGITAL SCREENING  (03/11/2009)   Mammogram due: 03/14/2009   Smoking status: current  (10/10/2009)  Lipids   Total Cholesterol: 171  (07/06/2008)   LDL: 72  (07/06/2008)   LDL Direct: Not documented   HDL: 70  (07/06/2008)   Triglycerides: 147  (07/06/2008)

## 2010-02-18 NOTE — Progress Notes (Signed)
Summary: prior authorization-Dexilant/gp  Phone Note Outgoing Call   Summary of Call: Prior Authorization required for Dexilant 60mg .  Pt. had to have tried and failed 2 out of the three:  Nexium, Prilosec, and Omeprazole. Initial call taken by: Chinita Pester RN,  August 07, 2009 12:02 PM  Follow-up for Phone Call        prilosec and omprazole are teh same thing? She has failed omeprazole and famotidine(pepcid). I think this will qualify. Dr. Allena Katz or I will sign auth form. Follow-up by: Julaine Fusi  DO,  August 07, 2009 12:22 PM  Additional Follow-up for Phone Call Additional follow up Details #1::        Insurance co. for PA called again; stated pt needs to try Nexium unless it is contraindicated.  Prilosec is Prilosec OTC.  Prior Auth. form needs to be completed and faxed; given to Dr. Allena Katz. Additional Follow-up by: Chinita Pester RN,  August 07, 2009 1:49 PM    Additional Follow-up for Phone Call Additional follow up Details #2::    PA form completed by Dr.Laken Rog; form faxed to [phone]. Follow-up by: Chinita Pester RN,  August 09, 2009 9:26 AM   Appended Document: prior authorization-Dexilant/gp Prior Authorization request for Dexilant has been denied. Dr. Allena Katz made awared.

## 2010-02-18 NOTE — Progress Notes (Signed)
Summary: PA, pt calling for status/ hla  Phone Note Call from Patient   Summary of Call: pt called to ask about PA, iformed it was in process and that it may take several days for an answer but that it was being addressed, that if she has not heard by 7/22 then to call back and ask again, this is agreeable w/ pt Initial call taken by: Marin Roberts RN,  August 08, 2009 11:43 AM

## 2010-02-18 NOTE — Assessment & Plan Note (Signed)
Summary: ACUTE/WATSON/FU VISIT FOR XRAY AND LABS/CH   Vital Signs:  Patient profile:   45 year old female Height:      67 inches (170.18 cm) Weight:      132.8 pounds (60.36 kg) BMI:     20.87 Temp:     98.9 degrees F Pulse rate:   60 / minute BP sitting:   119 / 76  (right arm) Cuff size:   regular  Vitals Entered By: Dorie Rank RN (October 30, 2009 3:41 PM) CC: follow up lab and xray - muscle tightening in bottom of throat - waiting for thyroid results Is Patient Diabetic? No Pain Assessment Patient in pain? no      Nutritional Status BMI of 19 -24 = normal  Have you ever been in a relationship where you felt threatened, hurt or afraid?No   Does patient need assistance? Functional Status Self care Ambulation Normal   Primary Care Provider:  Danelle Berry, MD  CC:  follow up lab and xray - muscle tightening in bottom of throat - waiting for thyroid results.  History of Present Illness: 45 y/o w wt loss from cause unknown, throid nodules, depression comes to the clinic for follow up appointment. last visit was 9/22  she does not have any new complaints.    Depression History:      The patient denies a depressed mood most of the day and a diminished interest in her usual daily activities.  The patient denies significant weight gain.        Comments:  stopped depression meds and "feel fine".   Preventive Screening-Counseling & Management  Alcohol-Tobacco     Smoking Status: quit  Comments: stopped smoking tobacco - smokes < or = to joint of marijuana a day  Current Medications (verified): 1)  Zolpidem Tartrate 5 Mg Tabs (Zolpidem Tartrate) .... Take 1 Tab By Mouth At Bedtime 2)  Clonazepam 0.5 Mg Tbdp (Clonazepam) .... Take 1 Tablet By Mouth Two Times A Day 3)  Nexium 40 Mg Cpdr (Esomeprazole Magnesium) .... Take 1 Capsule By Mouth Once A Day 4)  Flonase 50 Mcg/act Susp (Fluticasone Propionate) .... One Puff Each Nostril Once Daily 5)  Loratadine 10 Mg Tabs  (Loratadine) .... Take 1 Tablet By Mouth Once A Day 6)  Megace Oral 40 Mg/ml Susp (Megestrol Acetate) .... One Teaspoon By Mouth Daily  Allergies (verified): No Known Drug Allergies  Social History: Smoking Status:  quit  Physical Exam  General:  General: Alert, well developed and in no acurte distress ENT: mucous membranes pink & moist. No abnormal finds in ear and nose. thyroid fullness,  CVC:S1 S2 , no murmurs, no abnormal heart sounds. Lungs: Clear to auscultation B/L. No wheezes, crackles or other abnormal sounds Abdomen: soft, non distended, no tender. Normal Bowel sounds EXT: no pitting edema, no engorged veins, Pulsations normal  Neuro:alert, oriented *3, cranial nerved 2-12 intact, strenght normal in all  extremities, senstations normal to light touch.     Impression & Recommendations:  Problem # 1:  WEIGHT LOSS (ICD-783.21) patinet appetite is imprving with megace no complaints conitnue current dose BP controlled  Problem # 2:  GOITER, UNSPECIFIED (ICD-240.9) FNA biospsy ordered TSH, T4 normal Orders: Radiology other (Radiology Other)  Complete Medication List: 1)  Zolpidem Tartrate 5 Mg Tabs (Zolpidem tartrate) .... Take 1 tab by mouth at bedtime 2)  Clonazepam 0.5 Mg Tbdp (Clonazepam) .... Take 1 tablet by mouth two times a day 3)  Nexium 40 Mg Cpdr (Esomeprazole  magnesium) .... Take 1 capsule by mouth once a day 4)  Flonase 50 Mcg/act Susp (Fluticasone propionate) .... One puff each nostril once daily 5)  Loratadine 10 Mg Tabs (Loratadine) .... Take 1 tablet by mouth once a day 6)  Megace Oral 40 Mg/ml Susp (Megestrol acetate) .... One teaspoon by mouth daily  Patient Instructions: 1)  Please schedule a follow-up appointment in 1 month with PCP.   Appended Document: ACUTE/WATSON/FU VISIT FOR XRAY AND LABS/CH  Called the patient to let her know about the biopsy result. she wants to go to an ENT for follow up on the goiter. will put in referral   Clinical  Lists Changes  Medications: Rx of MEGACE ORAL 40 MG/ML SUSP (MEGESTROL ACETATE) One teaspoon by mouth daily;  #100 mL x 0;  Signed;  Entered by: Bethel Born MD;  Authorized by: Bethel Born MD;  Method used: Electronically to [facility] [id number], [place], [place], [place]  [address], Ph: [phone], Fax: [phone] Orders: Added new Referral order of ENT Referral (ENT) - Signed    Prescriptions: MEGACE ORAL 40 MG/ML SUSP (MEGESTROL ACETATE) One teaspoon by mouth daily  #100 mL x 0   Entered and Authorized by:   Bethel Born MD   Signed by:   Bethel Born MD on 11/12/2009   Method used:   Electronically to        [facility] [id number] (retail)       [address]       [place], [place]  [address]       Ph: [phone]       Fax: [phone]   RxID:   [id number]

## 2010-02-18 NOTE — Assessment & Plan Note (Signed)
Summary: Medication intolence Claudette Laws)   Vital Signs:  Patient profile:   45 year old female Height:      67 inches Weight:      147.4 pounds BMI:     23.17 Temp:     98.5 degrees F oral Pulse rate:   79 / minute BP sitting:   108 / 71  (right arm)  Vitals Entered By: Filomena Jungling NT II (August 06, 2009 3:41 PM) CC: followupvisit/ stomach hurts when tries to eat, Depression Is Patient Diabetic? No Pain Assessment Patient in pain? yes     Location: abdomen Intensity: 4 Type: aching Onset of pain  1 week and 3 days  Does patient need assistance? Functional Status Self care Ambulation Normal   Primary Care Provider:  Joaquin Courts  MD  CC:  followupvisit/ stomach hurts when tries to eat and Depression.  History of Present Illness: Stephanie Padilla is a 73 y/o woman with PMH/ problems as outlined in EMR. She comes to the office today for the following complaints.  Decreased appetite and wt loss since starting her on Bupropion on her last visit. We gave her Bupropion during her last visit as she strongly wanted to quit smoking and thought Mirtazapine was not helping her any more.  She reports of sick feeling in stomach and decrease appetite. She also says she vomitted once wmhile on Bupropion. But she is successful to quit smoking. She didn't smoke since 07/26/2009.  But says she has shakiness and more insomnia- not helped by Ambien.  Also c/o Abdominal bloating and mid abd pain, 4/10, cramping, different from her menstrual pain. She also has constipation which is chronic, intermittent. she said she has been worked up for this extensively but there problem is found.  Also she has GERD which is not improved with twice daily omeprazole 20 mg.  Denies any fever, chest pain, cough, vision changes, urinary changes.  Depression History:      The patient denies a depressed mood most of the day and a diminished interest in her usual daily activities.         Preventive  Screening-Counseling & Management  Alcohol-Tobacco     Alcohol drinks/day: 0     Smoking Status: current     Packs/Day: smokes marijuana + 6 cigs per day     Year Started: daily     Year Quit: QUIT 1 WEEK  Caffeine-Diet-Exercise     Does Patient Exercise: no  Problems Prior to Update: 1)  Irritable Bowel Syndrome  (ICD-564.1) 2)  Insomnia  (ICD-780.52) 3)  Angular Blepharoconjunctivitis  (ICD-372.21) 4)  Nicotine Addiction  (ICD-305.1) 5)  Cannabis Abuse  (ICD-305.20) 6)  Family History of Malignant Neoplasm of Breast  (ICD-V16.3) 7)  Gerd  (ICD-530.81) 8)  Depression  (ICD-311) 9)  Family History Depression  (ICD-V17.0) 10)  Contraceptive Management Nos  (ICD-V25.9)  Medications Prior to Update: 1)  Omeprazole 20 Mg Cpdr (Omeprazole) .... Take 1 Tablet By Mouth Two Times A Day 2)  Zolpidem Tartrate 5 Mg Tabs (Zolpidem Tartrate) .... Take 1 Tab By Mouth At Bedtime 3)  Buproban 150 Mg Xr12h-Tab (Bupropion Hcl (Smoking Deter)) .... Take 1 Tab 2 Times A Day By Mouth  Current Medications (verified): 1)  Zolpidem Tartrate 5 Mg Tabs (Zolpidem Tartrate) .... Take 1 Tab By Mouth At Bedtime 2)  Clonazepam 0.5 Mg Tbdp (Clonazepam) .... Take 1 Tablet By Mouth Two Times A Day 3)  Dexilant 60 Mg Cpdr (Dexlansoprazole) .... Take 1 Tablet  By Mouth Once A Day  Allergies (verified): No Known Drug Allergies  Review of Systems       as per HPI.Marland Kitchen  Physical Exam  General:  alert, well-developed, and well-nourished.   Head:  normocephalic and atraumatic.   Neck:  no thyromegaly. Lungs:  normal respiratory effort, no intercostal retractions, no accessory muscle use, and normal breath sounds.   Heart:  normal rate, regular rhythm, and no murmur.   Abdomen:  soft, non-tender, and normal bowel sounds.   Pulses:  Pedal pulses 2+/4 bilaterally   Impression & Recommendations:  Problem # 1:  DEPRESSION (ICD-311) Assessment Comment Only She says she is stable in terms of her depression and  reports no significant difference since her last visit. Her 43 y/o elder soname out of jail recently and is worried about him displaying negative behaviour, not obeying her and not trying to find a job. She thinks this will negatively affect her younger son. Also had problems with her daughter due to which she went to stay with her father recently. All this thing is giving her lot of stress in life. Also her loss of appetite with Bupropion and insomnia and recent smoking cessation adds to it. So will stop Bupropion today.  She seems to be much worried about her wt even though she has normal BMI! She seems to be anxieted more today than depressed. So Dr. Phillips Odor decided to start her on Clonazepam 0.5 mg twice daily and follow her up at her next appointment in 1st week of next month, reassess her at that time and make approprate changes.   Her updated medication list for this problem includes:    Clonazepam 0.5 Mg Tbdp (Clonazepam) .Marland Kitchen... Take 1 tablet by mouth two times a day  Problem # 2:  IRRITABLE BOWEL SYNDROME (ICD-564.1) Pt complaints of long standing constipation, acid reflux, abd bloating. She had been worked up for this extensively with no cause found. Considering her Hx and her association with depression, she probably has IBS.  Advised her to take OTC laxative for her constipation as of now. Reassess the pt at next visit.   Problem # 3:  INSOMNIA (ICD-780.52) Its worse since on Bupropion and smoking cessation since 07/26/2009. It is probably due to nicotine withdrawl and her current stresses of life. Explained her that this will get better once she gets better overall. Also Clonazepam will help her to get sleep. Her updated medication list for this problem includes:    Zolpidem Tartrate 5 Mg Tabs (Zolpidem tartrate) .Marland Kitchen... Take 1 tab by mouth at bedtime  Problem # 4:  GERD (ICD-530.81) She is still having c/o acid reflux, which seems to be non-responsive to twicw daily Omeprazole as  below. So changed her to Dexilant as below. Reassess at next visit. The following medications were removed from the medication list:    Omeprazole 20 Mg Cpdr (Omeprazole) .Marland Kitchen... Take 1 tablet by mouth two times a day    Dexilant 60 Mg Cpdr (Dexlansoprazole) .Marland Kitchen... Take 1 tablet by mouth once a day Her updated medication list for this problem includes:    Nexium 40 Mg Cpdr (Esomeprazole magnesium) .Marland Kitchen... Take 1 capsule by mouth once a day  Complete Medication List: 1)  Zolpidem Tartrate 5 Mg Tabs (Zolpidem tartrate) .... Take 1 tab by mouth at bedtime 2)  Clonazepam 0.5 Mg Tbdp (Clonazepam) .... Take 1 tablet by mouth two times a day 3)  Nexium 40 Mg Cpdr (Esomeprazole magnesium) .... Take 1 capsule by mouth  once a day  Patient Instructions: 1)  Please schedule a follow-up appointment in 2 weeks. Prescriptions: NEXIUM 40 MG CPDR (ESOMEPRAZOLE MAGNESIUM) Take 1 capsule by mouth once a day  #30 x 3   Entered and Authorized by:   Lyn Hollingshead   Signed by:   Lyn Hollingshead on 08/09/2009   Method used:   Electronically to        [facility] [id number] (retail)       [address] [place], [place]  [address]       Ph: [phone]       Fax: [phone]   RxID:   [id number] DEXILANT 60 MG CPDR (DEXLANSOPRAZOLE) Take 1 tablet by mouth once a day  #30 x 0   Entered and Authorized by:   Lyn Hollingshead   Signed by:   Lyn Hollingshead on 08/06/2009   Method used:   Print then Give to Patient   RxID:   [id number] CLONAZEPAM 0.5 MG TBDP (CLONAZEPAM) Take 1 tablet by mouth two times a day  #60 x 0   Entered and Authorized by:   Lyn Hollingshead   Signed by:   Lyn Hollingshead on 08/06/2009   Method used:   Print then Give to Patient   RxID:   [id number]   Prevention & Chronic Care Immunizations   Influenza vaccine: Not documented   Influenza vaccine deferral: Refused  (10/15/2008)    Tetanus booster: Not documented    Pneumococcal vaccine: Not documented  Other  Screening   Pap smear: NEGATIVE FOR INTRAEPITHELIAL LESIONS OR MALIGNANCY.  (07/06/2008)   Pap smear due: 07/06/2009    Mammogram: ASSESSMENT: Negative - BI-RADS 1^MM DIGITAL SCREENING  (03/11/2009)   Mammogram due: 03/14/2009   Smoking status: current  (08/06/2009)  Lipids   Total Cholesterol: 171  (07/06/2008)   LDL: 72  (07/06/2008)   LDL Direct: Not documented   HDL: 70  (07/06/2008)   Triglycerides: 147  (07/06/2008)  Appended Document: Medication intolence Claudette Laws) I have discussed the care of this patient in detail with the resident and agree fully with the documentation completed.

## 2010-02-18 NOTE — Miscellaneous (Signed)
Summary: Orders Update: Free T4, Thyroid US, Megace  Clinical Lists Changes  Problems: Added new problem of GOITER, UNSPECIFIED (ICD-240.9) Medications: Added new medication of MEGACE ES 625 MG/5ML SUSP (MEGESTROL ACETATE) 1 teaspoon by mouth daily - Signed Rx of MEGACE ES 625 MG/5ML SUSP (MEGESTROL ACETATE) 1 teaspoon by mouth daily;  #1 bottle x 0;  Signed;  Entered by: Johnette Abraham DO;  Authorized by: Johnette Abraham DO;  Method used: Electronically to [facility] [id number], [place], [place], [place]  [address], Ph: [phone], Fax: [phone] Orders: Added new Test order of T-T4, Free [id number]) - Signed Added new Test order of Ultrasound (Ultrasound) - Signed    Prescriptions: MEGACE ES 625 MG/5ML SUSP (MEGESTROL ACETATE) 1 teaspoon by mouth daily  #1 bottle x 0   Entered and Authorized by:   Johnette Abraham DO   Signed by:   Johnette Abraham DO on 10/16/2009   Method used:   Electronically to        [facility] [id number] (retail)       [address] [place], [place]  [address]       Ph: [phone]       Fax: [phone]   RxID:   [id number]

## 2010-02-18 NOTE — Progress Notes (Signed)
Summary: Medication  Phone Note Call from Patient   Caller: Patient Summary of Call: Medication Buproprion is making her shakey.  Has not been eating since being on the medication.  Has been drinking Ensure which is constipating.  Pt said that the sleep medication is not helping as much.  Nauseated as well.  The smell of food makes her ill.  Said that the Bupriprion helped with the anxiety but decreased te appetite. Initial call taken by: Angelina Ok RN,  August 05, 2009 4:10 PM  Follow-up for Phone Call        Patient needs to come in to be seen. She self d/ced 3 medications before her last appointment. Follow-up by: Zoila Shutter MD,  August 05, 2009 4:57 PM  Additional Follow-up for Phone Call Additional follow up Details #1::        Pt has already been scheduled for an appointment for tomorrow afternoon.Angelina Ok RN  August 05, 2009 5:03 PM

## 2010-02-18 NOTE — Progress Notes (Signed)
Summary: PREVENTIVE COLONOSCOPY  Phone Note Outgoing Call   Summary of Call: Patient is under the age of 56.  No information found at this time about any colonoscopy. Initial call taken by: Shon Hough,  November 27, 2009 4:55 PM

## 2010-02-19 ENCOUNTER — Ambulatory Visit: Payer: Self-pay | Admitting: Dietician

## 2010-02-19 ENCOUNTER — Ambulatory Visit: Admit: 2010-02-19 | Payer: Self-pay
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
## 2010-02-20 NOTE — Progress Notes (Signed)
Summary: Prior Authorization-protonix//kg  Phone Note Outgoing Call   Call placed by: Cynda Familia Duncan Dull),  January 27, 2010 9:24 AM Summary of Call: Contaced ACS at [phone] to iniated PA for pantoprazole 40mg  (protonix) after receiving faxed request from pharmacy. I was informed that no PA was needed and pantoprazole was the preferred med.  It was orginally denied 2/2 to the two times a day dosing, but insurance plan will pay for it.  ACS assured me that the claim has been paid and the pt will be able to get additional refills as needed.Cynda Familia [facility])  January 27, 2010 9:27 AM

## 2010-03-24 ENCOUNTER — Other Ambulatory Visit: Payer: Self-pay | Admitting: Internal Medicine

## 2010-03-24 DIAGNOSIS — Z1231 Encounter for screening mammogram for malignant neoplasm of breast: Secondary | ICD-10-CM

## 2010-03-31 ENCOUNTER — Ambulatory Visit (HOSPITAL_COMMUNITY)
Admission: RE | Admit: 2010-03-31 | Discharge: 2010-03-31 | Disposition: A | Discharge status: Home / Self Care | Payer: Medicaid Other | Source: Ambulatory Visit | Attending: Diagnostic Radiology | Admitting: Diagnostic Radiology

## 2010-03-31 DIAGNOSIS — Z1231 Encounter for screening mammogram for malignant neoplasm of breast: Secondary | ICD-10-CM | POA: Insufficient documentation

## 2010-04-10 ENCOUNTER — Encounter: Payer: Self-pay | Admitting: Internal Medicine

## 2010-06-06 NOTE — Group Therapy Note (Signed)
NAME:  Stephanie Padilla, Stephanie Padilla                       ACCOUNT NO.:  [id number]   MEDICAL RECORD NO.:  [id number]                   PATIENT TYPE:  OUT   LOCATION:  [facility]                           FACILITY:  [facility]   PHYSICIAN:  Elsie Lincoln, MD                   DATE OF BIRTH:  May 10, 1965   DATE OF SERVICE:  02/20/2003                                    CLINIC NOTE   REASON FOR VISIT:  This is a 45 year old G4 para 3-0-1-3 female who is for  follow-up of mild endometritis.  The patient had a D&C early January for a  septic AB and from that point on she developed lower abdominal pain and low-  grade fevers around 100.  The patient was placed on Cipro and Flagyl for 10  days and feels much better today.  She denies any fevers, highest  temperature was in the 98s, and is having no vaginal spotting or discharge.  The patient is not sexually active yet.  However, she did get the Depo shot  prior to discharge after the septic AB.   PHYSICAL EXAMINATION:  VITAL SIGNS:  Temperature 99.9, pulse 86, blood  pressure 113/60, weight 129.  ABDOMEN:  Soft, nontender, nondistended.  PELVIC:  Vagina:  No discharge, no blood.  Cervix:  Closed, nontender.  Uterus:  Nontender, firm, small, retroverted.  Adnexa:  No masses,  nontender.   ASSESSMENT AND PLAN:  A 45 year old female with resolving endometritis.   1. Continue taking temperature.  If anything over 100 occurs the patient is     to call [facility].  2. Continue Cipro/Flagyl course.  3. No intercourse until 1 week.  4. Start taking prenatal vitamins if the patient may desire to become     pregnant in the near future.  5. Given that the patient had P-PROM around approximately 12 weeks need to     rule out some type of cervical incompetence with her next pregnancy.  The     patient understands that when she gets her first positive pregnancy test     she is to come to high risk clinic in order to be followed for cervical     length.  However,  the patient has had no prior history of preterm     delivery and has had three normal vaginal deliveries other than this last     occurrence.                                               Elsie Lincoln, MD    KL/MEDQ  D:  02/20/2003  T:  02/20/2003  Job:  [id number]

## 2010-06-06 NOTE — Group Therapy Note (Signed)
NAME:  Stephanie Padilla, Stephanie Padilla                       ACCOUNT NO.:  [id number]   MEDICAL RECORD NO.:  [id number]                   PATIENT TYPE:  OUT   LOCATION:  [facility]                           [facility]ACILITY[facility]   PHYSICIAN:  Elsie Lincoln, MD                   DATE OF BIRTH:  1965-11-24   DATE OF SERVICE:  02/13/2003                                    CLINIC NOTE   REASON FOR VISIT:  A 45 year old female G4 para 3-0-1-3 status post D&C 2  weeks ago for septic AB.  The patient had been ruptured and fetus was at os  when she presented to [facility].  The patient underwent a D&C without  complications other than some minor bleeding.  The patient did receive  Methergine after the D&C.  Since the procedure the patient has been  experiencing some brownish discharge that she considers malodorous and some  abdominal tenderness.  She denies any fevers.  No nausea, vomiting,  diarrhea.  The patient had received Depo on discharge as well for birth  control.   PAST MEDICAL HISTORY:  Left fake eye.   PAST SURGICAL HISTORY:  C-section x2.   GYNECOLOGICAL HISTORY:  NSVD x1 and cesarean section x2.  Depo for  contraception.   PHYSICAL EXAMINATION:  VITAL SIGNS:  Temperature 100.1, pulse 100, blood  pressure 105/65, weight 123, height 5 feet 4 inches.  ABDOMEN:  Soft, nontender, nondistended.  No rebound, no guarding.  PELVIC:  External genitalia:  Tanner V.  Vagina:  Pink, minimal blood, no  odor, no discharge.  Cervix:  Closed, nontender.  Uterus:  Mildly tender on  deep palpation.  Once again, no rebound, no guarding.  Adnexa:  No masses,  nontender.   ASSESSMENT AND PLAN:  A 45 year old female status post septic abortion and  D&C.   1. Mild endometritis.  Will give 10 days treatment of Cipro and Flagyl p.o.  2. GC, chlamydia, wet prep done.  3. Return to clinic in 1 week to evaluate status.  4. If fevers or severe abdominal pain occur or heavy bleeding the patient to     come to the [facility].                                              Elsie Lincoln, MD    KL/MEDQ  D:  02/13/2003  T:  02/13/2003  Job:  [id number]

## 2010-06-06 NOTE — Op Note (Signed)
Stephanie Padilla, Stephanie Padilla             ACCOUNT NO.:  [id number]   MEDICAL RECORD NO.:  [id number]          PATIENT TYPE:  AMB   LOCATION:  [facility]                           [facility]   PHYSICIAN:  Phil D. Okey Dupre, M.D.     DATE OF BIRTH:  [birth year]   DATE OF PROCEDURE:  08/20/2005  DATE OF DISCHARGE:                                 OPERATIVE REPORT   PREOPERATIVE DIAGNOSIS:  Incomplete abortion.   POSTOPERATIVE DIAGNOSIS:  Incomplete abortion.   PROCEDURE:  Dilatation and evacuation.   SURGEON:  Javier Glazier. Okey Dupre, M.D.   ANESTHESIA:  Local plus MAC.   POSTOPERATIVE CONDITION:  Satisfactory.   SPECIMENS TO PATHOLOGY:  Products of conception.   ESTIMATED BLOOD LOSS:  10 cc.   PROCEDURE:  After satisfactory MAC analgesia, with the patient in dorsal  lithotomy position, the perineum and vagina were prepped and draped in the  usual sterile manner.  Bimanual pelvic examination revealed the uterus of  about 10 weeks' size with first-degree retroversion.  The weighted speculum  was placed in the posterior fourchette of the vagina.  Through a marital  introitus, BUS was within normal limits.  Vagina was clean and well rugated.  The anterior lip of the cervix grasped with a double-toothed tenaculum.  Uterine cavity was sounded posteriorly to 10 cm.  The cervical os was  dilated to a #10 Hegar dilator which admitted very easily because of the  incomplete status of the miscarriage.  The #10 curved suction curet was used  to evaluate the uterine contents without incident.  The tenaculum and  speculum were removed from the vagina.  The patient was transferred to the  recovery room in satisfactory condition.  Prior to the [facility], 10 cc of  Xylocaine was injected in each of two areas at 8 and 4 o'clock in the  paracervical area for additional comfort of the patient.           ______________________________  Javier Glazier. Okey Dupre, M.D.     PDR/MEDQ  D:  08/20/2005  T:  08/20/2005  Job:  [id number]

## 2010-06-06 NOTE — Group Therapy Note (Signed)
Stephanie Padilla, COCKERELL NO.:  [id number]   MEDICAL RECORD NO.:  [id number]          PATIENT TYPE:  WOC   LOCATION[facility]                   [facility]   PHYSICIAN:  Ginger Carne, MD DATE OF BIRTH:  January 08, 1966   DATE OF SERVICE:  09/03/2005                                    CLINIC NOTE   REASON FOR CONSULTATION:  Followup of incomplete abortion post dilation and  curettage.   HISTORY OF PRESENT ILLNESS:  This is a 45 year old female who underwent a  dilation, curettage and evacuation on August 20, 2005, because of an  incomplete abortion, first trimester.  The patient reports no complaints or  problems with scant vaginal flow and no cramping.  She was provided Depo  Provera 150 mg intramuscularly at the time of her discharge from surgery for  contraception.   IMPRESSION:  Post first trimester incomplete abortion dilation and  curettage.   PLAN:  The patient will follow up every 3 months for her Depo Provera has  desired.           ______________________________  Ginger Carne, MD     SHB/MEDQ  D:  09/03/2005  T:  09/03/2005  Job:  [id number]

## 2010-06-06 NOTE — Op Note (Signed)
NAMEJANELE, LAGUE                       ACCOUNT NO.:  [id number]   MEDICAL RECORD NO.:  [id number]                   PATIENT TYPE:  AMB   LOCATION:  [facility]                                 [facility]   PHYSICIAN:  Lesly Dukes, M.D.              DATE OF BIRTH:  03/22/1965   DATE OF PROCEDURE:  01/29/2003  DATE OF DISCHARGE:                                 OPERATIVE REPORT   PREOPERATIVE DIAGNOSIS:  A 45 year old, G5, para 3-0-1-3 with incomplete  abortion.   POSTOPERATIVE DIAGNOSIS:  A 44 year old, G5, para 3-0-1-3 with incomplete  abortion.   PROCEDURE:  Dilation, vacuum, and curettage under ultrasound guidance.   SURGEON:  Lesly Dukes, M.D.   ANESTHESIA:  General.   ESTIMATED BLOOD LOSS:  300 mL.   COMPLICATIONS:  None.   PATHOLOGY:  Endometrial products of conception __________   DESCRIPTION OF PROCEDURE:  After informed consent was obtained, the patient  was taken to the operating room where general anesthesia was found to be  adequate.  The patient was prepared and draped in normal sterile fashion  while in the dorsal lithotomy position.  Bladder was in and out catheterized  with a red Roxan Hockey.  A weighted speculum was placed into the patient's  vagina and the anterior lip of the cervix grasped with a single-tooth  tenaculum.  The uterus felt about 11 weeks, already dilated to a size 10  Hegar dilator.  A curved 10 curette was introduced to the uterus, and gentle  suction and curettage was performed.  A large amount of POC was removed.  This was all done under ultrasound guidance to ensure perforation was not  done.  The suction curette was removed from the uterus, and the sharp  curette was gently introduced to the uterus; gentle sharp curettage was  performed.  A final pass with the suction curette was then made to ensure  that all products of conception were removed.  The patient received  Methergine and Pitocin to aid in uterine contractions.   Vigorous bimanual  massage was also employed.  At the end of the procedure, the cervix was  noted to be hemostatic, and there was no active bleeding.  The patient  tolerated the procedure well.  Sponge, lap, instrument, and needle count  were correct x 2, and the patient went to the recovery room in stable  condition.                                               Lesly Dukes, M.D.    Lora Paula  D:  01/29/2003  T:  01/29/2003  Job:  [id number]

## 2010-06-20 ENCOUNTER — Encounter: Payer: Self-pay | Admitting: Internal Medicine

## 2010-10-29 LAB — POCT URINALYSIS DIP (DEVICE)
Bilirubin Urine: NEGATIVE
Glucose, UA: NEGATIVE
Ketones, ur: 15 — AB
Operator id: 239701

## 2010-10-29 LAB — INFLUENZA A AND B ANTIGEN (CONVERTED LAB)
Inflenza A Ag: NEGATIVE
Influenza B Ag: NEGATIVE

## 2010-10-29 LAB — POCT PREGNANCY, URINE: Operator id: 239701

## 2010-11-26 ENCOUNTER — Ambulatory Visit (INDEPENDENT_AMBULATORY_CARE_PROVIDER_SITE_OTHER): Payer: Medicaid Other | Admitting: Internal Medicine

## 2010-11-26 ENCOUNTER — Encounter: Payer: Self-pay | Admitting: Internal Medicine

## 2010-11-26 VITALS — BP 112/73 | HR 71 | Temp 98.9°F | Ht 65.0 in | Wt 124.8 lb

## 2010-11-26 DIAGNOSIS — J209 Acute bronchitis, unspecified: Secondary | ICD-10-CM

## 2010-11-26 DIAGNOSIS — J410 Simple chronic bronchitis: Secondary | ICD-10-CM

## 2010-11-26 DIAGNOSIS — Z720 Tobacco use: Secondary | ICD-10-CM

## 2010-11-26 DIAGNOSIS — F17200 Nicotine dependence, unspecified, uncomplicated: Secondary | ICD-10-CM

## 2010-11-26 DIAGNOSIS — M5412 Radiculopathy, cervical region: Secondary | ICD-10-CM

## 2010-11-26 DIAGNOSIS — G629 Polyneuropathy, unspecified: Secondary | ICD-10-CM | POA: Insufficient documentation

## 2010-11-26 DIAGNOSIS — G609 Hereditary and idiopathic neuropathy, unspecified: Secondary | ICD-10-CM | POA: Insufficient documentation

## 2010-11-26 DIAGNOSIS — M5410 Radiculopathy, site unspecified: Secondary | ICD-10-CM

## 2010-11-26 HISTORY — DX: Radiculopathy, site unspecified: M54.10

## 2010-11-26 MED ORDER — VARENICLINE TARTRATE 1 MG PO TABS: 1.0000 mg | ORAL_TABLET | Freq: Two times a day (BID) | ORAL | Status: DC

## 2010-11-26 MED ORDER — GUAIFENESIN-CODEINE 100-10 MG/5ML PO SYRP: 5.0000 mL | ORAL_SOLUTION | Freq: Three times a day (TID) | ORAL | Status: AC | PRN

## 2010-11-26 MED ORDER — ALBUTEROL SULFATE HFA 108 (90 BASE) MCG/ACT IN AERS: 2.0000 | INHALATION_SPRAY | Freq: Four times a day (QID) | RESPIRATORY_TRACT | Status: DC | PRN

## 2010-11-26 NOTE — Patient Instructions (Signed)
Radicular Pain °Radicular pain in either the arm or leg is usually from a bulging or herniated disk in the spine. A piece of the herniated disk may press against the nerves as the nerves exit the spine. This causes pain which is felt at the tips of the nerves down the arm or leg. Other causes of radicular pain may include: °· Fractures.  °· Heart disease.  °· Cancer.  °· An abnormal and usually degenerative state of the nervous system or nerves (neuropathy).  °Diagnosis may require CT or MRI scanning to determine the primary cause.  °Nerves that start at the neck (nerve roots) may cause radicular pain in the outer shoulder and arm. It can spread down to the thumb and fingers. The symptoms vary depending on which nerve root has been affected. In most cases radicular pain improves with conservative treatment. Neck problems may require physical therapy, a neck collar, or cervical traction. Treatment may take many weeks, and surgery may be considered if the symptoms do not improve.  °Conservative treatment is also recommended for sciatica. Sciatica causes pain to radiate from the lower back or buttock area down the leg into the foot. Often there is a history of back problems. Most patients with sciatica are better after 2 to 4 weeks of rest and other supportive care. Short term bed rest can reduce the disk pressure considerably. Sitting, however, is not a good position since this increases the pressure on the disk. You should avoid bending, lifting, and all other activities which make the problem worse. Traction can be used in severe cases. Surgery is usually reserved for patients who do not improve within the first months of treatment. °Only take over-the-counter or prescription medicines for pain, discomfort, or fever as directed by your caregiver. Narcotics and muscle relaxants may help by relieving more severe pain and spasm and by providing mild sedation. Cold or massage can give significant relief. Spinal  manipulation is not recommended. It can increase the degree of disc protrusion. Epidural steroid injections are often effective treatment for radicular pain. These injections deliver medicine to the spinal nerve in the space between the protective covering of the spinal cord and back bones (vertebrae). Your caregiver can give you more information about steroid injections. These injections are most effective when given within two weeks of the onset of pain.  °You should see your caregiver for follow up care as recommended. A program for neck and back injury rehabilitation with stretching and strengthening exercises is an important part of management.  °SEEK IMMEDIATE MEDICAL CARE IF: °· You develop increased pain, weakness, or numbness in your arm or leg.  °· You develop difficulty with bladder or bowel control.  °· You develop abdominal pain.  °Document Released: 02/13/2004 Document Revised: 09/17/2010 Document Reviewed: 04/30/2008 °[employer]® Patient Information ©2012 [employer]. °

## 2010-11-26 NOTE — Assessment & Plan Note (Signed)
Patient was started on Chantix today. The side effects were explained in detail including suicidal and homicidal ideation in the first 2 weeks of use. Follow up in one month.

## 2010-11-26 NOTE — Progress Notes (Signed)
Subjective:    Patient ID: Stephanie Padilla, female    DOB: 06/18/1965, 45 y.o.   MRN: [id number]  HPI Patient is a 45 year old female with past medical history as mentioned in the chart.  She is here today with multiple medical problems.  Smoking cessation: Patient had stopped smoking when Dr. Claudette Laws had started her on bupropion. Patient has a started smoking last few months and has been smoking about half pack per day. She wants to quit again and his wanting help. She does not want to restart bupropion as she tried that and it did not work.  Bronchitis: Patient is having acute cough which is unresolved with over-the-counter medications. Patient is bringing up yellow to white sputum every time she coughs. It has been going on since last 2 weeks now. No fever or chills noted. Patient denies recent change in environment, new pets, sick contacts or any recent travel. Patient continues to smoke about half pack a day. No wheezing noted.  Radicular pain: Patient is complaining of severe pain especially on the right side of her neck. The pain radiates to her right shoulder. Worse in the mornings. The pain is 8-9/10 at its worse. There are no relieving factors. Patient was seen by a chiropractor 2 years ago who told her that she has a pinched nerve. We do not have the imaging studies done by him. Patient does not report any tingling or numbness in her right arm. She is requesting an orthopedic referral as she wants to be treated by a specialist. Patient's sister recently had an operation of her neck.   No other complaints at this time.  Review of Systems  Constitutional: Negative for fever, activity change and appetite change.  HENT: Negative for sore throat.   Respiratory: Positive for cough and chest tightness. Negative for shortness of breath.   Cardiovascular: Negative for chest pain and leg swelling.  Gastrointestinal: Negative for nausea, abdominal pain, diarrhea, constipation and abdominal  distention.  Genitourinary: Negative for frequency, hematuria and difficulty urinating.  Musculoskeletal: Positive for arthralgias.  Neurological: Negative for dizziness and headaches.  Psychiatric/Behavioral: Negative for suicidal ideas and behavioral problems.       Objective:   Physical Exam  Constitutional: She is oriented to person, place, and time. She appears well-developed and well-nourished.  HENT:  Head: Normocephalic and atraumatic.  Eyes: Conjunctivae and EOM are normal. Pupils are equal, round, and reactive to light. No scleral icterus.  Neck: Normal range of motion. Neck supple. No JVD present. No thyromegaly present.  Cardiovascular: Normal rate, regular rhythm, normal heart sounds and intact distal pulses.  Exam reveals no gallop and no friction rub.   No murmur heard. Pulmonary/Chest: Effort normal and breath sounds normal. No respiratory distress. She has no wheezes. She has no rales.  Abdominal: Soft. Bowel sounds are normal. She exhibits no distension and no mass. There is no tenderness. There is no rebound and no guarding.  Musculoskeletal: She exhibits no edema and no tenderness.       Right shoulder: She exhibits decreased range of motion and tenderness. She exhibits no bony tenderness, no swelling, no deformity and no laceration.       Cervical back: She exhibits decreased range of motion, tenderness, bony tenderness and pain. She exhibits no edema, no deformity, no laceration and no spasm.  Lymphadenopathy:    She has no cervical adenopathy.  Neurological: She is alert and oriented to person, place, and time.  Psychiatric: She has a normal mood and  affect. Her behavior is normal.          Assessment & Plan:

## 2010-11-27 NOTE — Assessment & Plan Note (Signed)
Patient has seen a chiropractor in the past about 2 years ago. She has imaging studies done at that time. The symptoms are classical for cervical radiculopathy and given that patient wants to be checked by a specialist, I will refer her to orthopedic. Muscle relaxant was prescribed and she was taught some conservative measures to relieve pain.

## 2011-01-01 ENCOUNTER — Encounter: Payer: Medicaid Other | Admitting: Internal Medicine

## 2011-01-15 ENCOUNTER — Emergency Department (HOSPITAL_COMMUNITY): Payer: Medicaid Other

## 2011-01-15 ENCOUNTER — Emergency Department (HOSPITAL_COMMUNITY)
Admission: EM | Admit: 2011-01-15 | Discharge: 2011-01-15 | Disposition: A | Discharge status: Nursing Home (Includes ICF) | Payer: Medicaid Other | Source: Home / Self Care | Attending: Emergency Medicine | Admitting: Emergency Medicine

## 2011-01-15 ENCOUNTER — Emergency Department (HOSPITAL_COMMUNITY)
Admission: EM | Admit: 2011-01-15 | Discharge: 2011-01-16 | Disposition: A | Discharge status: Home / Self Care | Payer: Medicaid Other | Attending: Emergency Medicine | Admitting: Emergency Medicine

## 2011-01-15 ENCOUNTER — Encounter (HOSPITAL_COMMUNITY): Payer: Self-pay | Admitting: *Deleted

## 2011-01-15 ENCOUNTER — Telehealth: Payer: Self-pay | Admitting: *Deleted

## 2011-01-15 ENCOUNTER — Encounter (HOSPITAL_COMMUNITY): Payer: Self-pay

## 2011-01-15 DIAGNOSIS — H5710 Ocular pain, unspecified eye: Secondary | ICD-10-CM | POA: Insufficient documentation

## 2011-01-15 DIAGNOSIS — H05012 Cellulitis of left orbit: Secondary | ICD-10-CM

## 2011-01-15 DIAGNOSIS — H5789 Other specified disorders of eye and adnexa: Secondary | ICD-10-CM | POA: Insufficient documentation

## 2011-01-15 DIAGNOSIS — F3289 Other specified depressive episodes: Secondary | ICD-10-CM | POA: Insufficient documentation

## 2011-01-15 DIAGNOSIS — F329 Major depressive disorder, single episode, unspecified: Secondary | ICD-10-CM | POA: Insufficient documentation

## 2011-01-15 DIAGNOSIS — H00039 Abscess of eyelid unspecified eye, unspecified eyelid: Secondary | ICD-10-CM

## 2011-01-15 DIAGNOSIS — L03213 Periorbital cellulitis: Secondary | ICD-10-CM

## 2011-01-15 DIAGNOSIS — H05019 Cellulitis of unspecified orbit: Secondary | ICD-10-CM | POA: Insufficient documentation

## 2011-01-15 LAB — BASIC METABOLIC PANEL
Chloride: 102 mEq/L (ref 96–112)
Creatinine, Ser: 0.54 mg/dL (ref 0.50–1.10)
GFR calc Af Amer: >90 mL/min (ref >90)
Potassium: 3.3 mEq/L — ABNORMAL LOW (ref 3.5–5.1)
Sodium: 136 mEq/L (ref 135–145)

## 2011-01-15 LAB — CBC
MCV: 93.8 fL (ref 78.0–100.0)
Platelets: 257 10*3/uL (ref 150–400)
RDW: 11.9 % (ref 11.5–15.5)
WBC: 8.7 10*3/uL (ref 4.0–10.5)

## 2011-01-15 MED ORDER — IOHEXOL 300 MG/ML  SOLN
75.0000 mL | Freq: Once | INTRAMUSCULAR | Status: AC | PRN
  Administered 2011-01-15: 75 mL via INTRAVENOUS

## 2011-01-15 MED ORDER — HYDROCODONE-ACETAMINOPHEN 5-325 MG PO TABS
2.0000 | ORAL_TABLET | Freq: Once | ORAL | Status: AC
  Administered 2011-01-15: 2 via ORAL
  Filled 2011-01-15: qty 2
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
## 2011-01-15 NOTE — Telephone Encounter (Signed)
Pt inform and is at [facility] now.

## 2011-01-15 NOTE — ED Notes (Signed)
Pt has lt prosthetic eye- states since 01-12-11 lt eye has been swollen, painful  and draining.

## 2011-01-15 NOTE — Telephone Encounter (Signed)
Pt called stating she has a prosthesis in rt eye.  It's swollen, red and draining. She has had this most of her life, so does not see anyone for care in this area.   She is asking for a referral to eye doctor. She has taken the prosthesis out but it's not improved.  I recommended [facility] visit now to get the infection under control and be evaluated. Please advise.  Pt # [id number]

## 2011-01-15 NOTE — ED Notes (Signed)
Presents with c/o left eye pain, swelling and drainage.   States prosthetic eye.
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
## 2011-01-15 NOTE — Telephone Encounter (Signed)
Agree needs urgent evaluation in ED or UC.

## 2011-01-15 NOTE — ED Notes (Signed)
Drainage and pain in the lt eye since Monday .  She has swelling and drainage and pain.  She was sent here from [facility] for a c-t scan.   She has a lt eye prosthesis  Since she was 45 years old

## 2011-01-16 MED ORDER — METRONIDAZOLE IN NACL 5-0.79 MG/ML-% IV SOLN
500.0000 mg | INTRAVENOUS | Status: AC
  Administered 2011-01-16: 500 mg via INTRAVENOUS
  Filled 2011-01-16 (×2): qty 100

## 2011-01-16 MED ORDER — DOXYCYCLINE HYCLATE 100 MG PO CAPS: 100.0000 mg | ORAL_CAPSULE | Freq: Two times a day (BID) | ORAL | Status: AC

## 2011-01-16 MED ORDER — HYDROCODONE-ACETAMINOPHEN 5-325 MG PO TABS: 1.0000 | ORAL_TABLET | ORAL | Status: DC | PRN

## 2011-01-16 MED ORDER — VANCOMYCIN HCL IN DEXTROSE 1-5 GM/200ML-% IV SOLN
1000.0000 mg | Freq: Once | INTRAVENOUS | Status: AC
  Administered 2011-01-16: 1000 mg via INTRAVENOUS
  Filled 2011-01-16: qty 200

## 2011-01-16 MED ORDER — DEXTROSE 5 % IV SOLN: 1.0000 g | Freq: Once | INTRAVENOUS | Status: DC

## 2011-01-16 MED ORDER — DEXTROSE 5 % IV SOLN
2.0000 g | Freq: Once | INTRAVENOUS | Status: AC
  Administered 2011-01-16: 2 g via INTRAVENOUS
  Filled 2011-01-16: qty 2

## 2011-01-16 MED ORDER — DICLOXACILLIN SODIUM 500 MG PO CAPS: 500.0000 mg | ORAL_CAPSULE | Freq: Four times a day (QID) | ORAL | Status: AC

## 2011-01-16 MED ORDER — IBUPROFEN 400 MG PO TABS: 400.0000 mg | ORAL_TABLET | Freq: Four times a day (QID) | ORAL | Status: AC | PRN

## 2011-01-16 NOTE — ED Notes (Signed)
Assumption of care from Drs. Linker and Schinlever. IV abx complete. Home with PO antibiotics and Ophthalmology f/u as previously outlined.  Forbes Cellar, MD 01/16/11 [phone]

## 2011-01-17 NOTE — ED Provider Notes (Signed)
I saw and evaluated the patient, reviewed the resident's note and I agree with the findings and plan.  Pt with possible orbital cellulitis in eye with prosthesis.  Opthalmology recommendations obtained and followed as above.  Pt given antibiotics and will follow up with opthalmology  Ethelda Chick, MD 01/17/11 [phone]

## 2011-01-19 LAB — WOUND CULTURE: Gram Stain: NONE SEEN

## 2011-01-20 NOTE — ED Notes (Signed)
+   Eye culture Chart sent to EDP office for review.

## 2011-02-12 ENCOUNTER — Telehealth: Payer: Self-pay | Admitting: *Deleted

## 2011-02-12 DIAGNOSIS — Z970 Presence of artificial eye: Secondary | ICD-10-CM

## 2011-02-12 HISTORY — DX: Presence of artificial eye: Z97.0

## 2011-02-12 NOTE — Telephone Encounter (Signed)
Pt called asking for a referral to another eye doctor that specializes in prostheses.  She was seen at [facility] and sent to Ed on 12/27 for Orbital Cellulitis.  She was given antibiotics and sent for f/u with Dr Delaney Meigs.  She has seen him twice and given eye drops and told to come back in one year.  She is concerned about this and wants a second opinion.  Her eye does not feel back to normal. Please call pt at [phone] for advise.  Hx: prosthetic eye

## 2011-02-12 NOTE — Telephone Encounter (Signed)
I put in referral.

## 2011-02-13 NOTE — Telephone Encounter (Signed)
Information given to [facility] to work on referral. Pt has been informed

## 2011-03-26 DIAGNOSIS — Q111 Other anophthalmos: Secondary | ICD-10-CM | POA: Insufficient documentation

## 2011-04-15 ENCOUNTER — Telehealth: Payer: Self-pay | Admitting: *Deleted

## 2011-04-15 NOTE — Telephone Encounter (Signed)
Pt is having some very serious eye surg in April but must be more physically stable before the surgery can be done. She is losing weight and not feeling well in general. appt 4/2 dr Scot Dock at [phone]

## 2011-04-21 ENCOUNTER — Encounter: Payer: Self-pay | Admitting: Internal Medicine

## 2011-04-21 ENCOUNTER — Ambulatory Visit (INDEPENDENT_AMBULATORY_CARE_PROVIDER_SITE_OTHER): Payer: Medicaid Other | Admitting: Internal Medicine

## 2011-04-21 VITALS — BP 113/70 | HR 80 | Temp 98.5°F | Ht 65.0 in | Wt 118.3 lb

## 2011-04-21 DIAGNOSIS — R634 Abnormal weight loss: Secondary | ICD-10-CM

## 2011-04-21 DIAGNOSIS — F17200 Nicotine dependence, unspecified, uncomplicated: Secondary | ICD-10-CM

## 2011-04-21 LAB — COMPREHENSIVE METABOLIC PANEL
BUN: 7 mg/dL (ref 6–23)
CO2: 25 mEq/L (ref 19–32)
Creat: 0.61 mg/dL (ref 0.50–1.10)
Glucose, Bld: 101 mg/dL — ABNORMAL HIGH (ref 70–99)
Total Bilirubin: 0.7 mg/dL (ref 0.3–1.2)

## 2011-04-21 MED ORDER — TRAMADOL HCL 50 MG PO TABS: 50.0000 mg | ORAL_TABLET | Freq: Four times a day (QID) | ORAL | Status: DC | PRN

## 2011-04-21 MED ORDER — PANTOPRAZOLE SODIUM 40 MG PO PACK: 40.0000 mg | PACK | Freq: Every day | ORAL | Status: DC

## 2011-04-21 MED ORDER — PANTOPRAZOLE SODIUM 40 MG PO TBEC: 40.0000 mg | DELAYED_RELEASE_TABLET | Freq: Every day | ORAL | Status: DC

## 2011-04-21 MED ORDER — VARENICLINE TARTRATE 1 MG PO TABS
1.0000 mg | ORAL_TABLET | Freq: Two times a day (BID) | ORAL | Status: DC
Start: 2011-04-21 — End: 2012-01-18

## 2011-04-21 MED ORDER — MIRTAZAPINE 15 MG PO TABS: 15.0000 mg | ORAL_TABLET | Freq: Every day | ORAL | Status: DC

## 2011-04-21 NOTE — Patient Instructions (Signed)
Start taking the new pain medicine tramadol every 6 hrs as needed for pain Take up to 4-5 tylenol a day if you need to in addition to tramadol

## 2011-04-22 LAB — CBC WITH DIFFERENTIAL/PLATELET
Eosinophils Absolute: 0 10*3/uL (ref 0.0–0.7)
Eosinophils Relative: 0 % (ref 0–5)
Lymphs Abs: 2.4 10*3/uL (ref 0.7–4.0)
MCH: 31.6 pg (ref 26.0–34.0)
MCV: 94.1 fL (ref 78.0–100.0)
Monocytes Relative: 9 % (ref 3–12)
Platelets: 268 10*3/uL (ref 150–400)
RBC: 4.43 MIL/uL (ref 3.87–5.11)

## 2011-04-22 LAB — HIV ANTIBODY (ROUTINE TESTING W REFLEX): HIV: NONREACTIVE

## 2011-04-22 LAB — TSH: TSH: 2.183 u[IU]/mL (ref 0.350–4.500)

## 2011-04-22 NOTE — Progress Notes (Signed)
Patient ID: Stephanie Padilla, female   DOB: 07-Jun-1965, 46 y.o.   MRN: [id number]  46 year old woman with past medical history listed below comes to the clinic complaining of weight loss and poor appetite Is also complaining of throbbing pain in her left eye and is scheduled to undergo an extensive eye surgery at [facility] in 2 weeks See assessment and plan for details.  Physical exam  General Appearance:     Filed Vitals:   04/21/11 1456  BP: 113/70  Pulse: 80  Temp: 98.5 F (36.9 C)  TempSrc: Oral  Height: 5\' 5"  (1.651 m)  Weight: 118 lb 4.8 oz (53.661 kg)  SpO2: 99%     Alert, cooperative, no distress, appears stated age. Patient looks well-nourished but is very anxious   Head:    Normocephalic, without obvious abnormality, atraumatic  Eyes:    left eye is patched, right eye is normal       Neck:   Supple, symmetrical, trachea midline, no adenopathy;       thyroid:  No enlargement/tenderness/nodules; no carotid   bruit or JVD  Lungs:     Clear to auscultation bilaterally, respirations unlabored  Chest wall:    No tenderness or deformity  Heart:    Regular rate and rhythm, S1 and S2 normal, no murmur, rub   or gallop  Abdomen:     Soft, non-tender, bowel sounds active all four quadrants,    no masses, no organomegaly  Extremities:   Extremities normal, atraumatic, no cyanosis or edema  Pulses:   2+ and symmetric all extremities  Skin:   Skin color, texture, turgor normal, no rashes or lesions  Neurologic:  nonfocal grossly    Review of system-  Constitutional: Denies fever, chills, diaphoresis, appetite change and fatigue.  Respiratory: Denies SOB, DOE, cough, chest tightness,  and wheezing.   Cardiovascular: Denies chest pain, palpitations and leg swelling.  Gastrointestinal: Denies nausea, vomiting, abdominal pain, diarrhea, constipation, blood in stool and abdominal distention.  Skin: Denies pallor, rash and wound.  Neurological: Denies dizziness,  light-headedness, numbness and headaches.

## 2011-04-22 NOTE — Assessment & Plan Note (Signed)
Patient is complaining of chronic for her appetite and weight loss She states she has an aversion to food. She can keep it down but she is just not hungry. She can drink liquids without any problems and is trying to keep up with hydration but she does not feel like eating solid food. Her last meal was 3 days ago. When she tries to force her to eat solid food, she vomits. Is not having any pain in her belly, throat or any other organic complaint preventing her from eating. She is 118 pounds a day and she has lost about 40 pounds in past 2 years. She does not look malnourished or cachectic.  Multiple physicians have discussed this issue with her in detail before. The consensus is that patient has anxiety and depression and she has psychological reason for poor appetite. She also has severe acid reflux and is not taking any medicines for it and this is thought to be one other likely cause of her problem. She was referred to GI once and had an endoscopy and colonoscopy which were normal. She was started on Remeron for appetite stimulation which did not help very much. She was also on Effexor for a short time but I'm not sure why it was stopped and if she benefited from it when she was on it. She was on Megace for a long time and there was a lesion at worked but it was stopped because of potential for side effects. She had history of heavy smoking and marijuana use and that was attributed as the cause for loss of appetite but she has stopped doing that now and continues to have poor appetite.   Patient is requesting Megace today  Plan - Because patient is undergoing a surgery in 2 weeks and because we have not been able to figure out the cause of her poor appetite, I don't think Megace is the right choice for her at this time -I will check TSH, HIV, CBC, chemistry, liver function tests. These studies have been checked before at different times for this and other reasons and they have been negative - She does  not have any signs or symptoms of malignancy as a cause of her weight loss - She does look very anxious and has depression listed on her problem list she denies being depressed - I will restart PPI - She is requesting to restart Remeron as well and wants to give it a try's. Sunday start her on a low dose of Remeron to be titrated up in future - Followup after surgery  More than 45 minutes were spent discussing the problem and looking back at her records.

## 2011-04-24 ENCOUNTER — Encounter (HOSPITAL_COMMUNITY): Payer: Self-pay | Admitting: Emergency Medicine

## 2011-04-24 ENCOUNTER — Emergency Department (HOSPITAL_COMMUNITY)
Admission: EM | Admit: 2011-04-24 | Discharge: 2011-04-25 | Discharge status: Against Medical Advice | Payer: Medicaid Other | Attending: Emergency Medicine | Admitting: Emergency Medicine

## 2011-04-24 DIAGNOSIS — M549 Dorsalgia, unspecified: Secondary | ICD-10-CM | POA: Insufficient documentation

## 2011-04-24 DIAGNOSIS — M542 Cervicalgia: Secondary | ICD-10-CM | POA: Insufficient documentation
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
## 2011-04-26 ENCOUNTER — Emergency Department (HOSPITAL_COMMUNITY)
Admission: EM | Admit: 2011-04-26 | Discharge: 2011-04-26 | Disposition: A | Discharge status: Home / Self Care | Payer: Medicaid Other | Attending: Emergency Medicine | Admitting: Emergency Medicine

## 2011-04-26 ENCOUNTER — Encounter (HOSPITAL_COMMUNITY): Payer: Self-pay

## 2011-04-26 DIAGNOSIS — F17200 Nicotine dependence, unspecified, uncomplicated: Secondary | ICD-10-CM | POA: Insufficient documentation

## 2011-04-26 DIAGNOSIS — T148XXA Other injury of unspecified body region, initial encounter: Secondary | ICD-10-CM

## 2011-04-26 DIAGNOSIS — R51 Headache: Secondary | ICD-10-CM | POA: Insufficient documentation
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
## 2011-04-28 ENCOUNTER — Encounter: Payer: Medicaid Other | Admitting: Internal Medicine

## 2011-06-10 ENCOUNTER — Ambulatory Visit (INDEPENDENT_AMBULATORY_CARE_PROVIDER_SITE_OTHER): Payer: Medicaid Other | Admitting: Ophthalmology

## 2011-06-10 ENCOUNTER — Encounter: Payer: Self-pay | Admitting: Ophthalmology

## 2011-06-10 VITALS — BP 108/66 | HR 90 | Temp 99.4°F | Ht 64.0 in | Wt 117.2 lb

## 2011-06-10 DIAGNOSIS — H05012 Cellulitis of left orbit: Secondary | ICD-10-CM

## 2011-06-10 DIAGNOSIS — F17200 Nicotine dependence, unspecified, uncomplicated: Secondary | ICD-10-CM

## 2011-06-10 DIAGNOSIS — H5712 Ocular pain, left eye: Secondary | ICD-10-CM

## 2011-06-10 DIAGNOSIS — H5710 Ocular pain, unspecified eye: Secondary | ICD-10-CM

## 2011-06-10 MED ORDER — HYDROCODONE-ACETAMINOPHEN 5-325 MG PO TABS: 0.5000 | ORAL_TABLET | Freq: Four times a day (QID) | ORAL | Status: AC | PRN

## 2011-06-10 MED ORDER — MIRTAZAPINE 30 MG PO TABS: 30.0000 mg | ORAL_TABLET | Freq: Every day | ORAL | Status: DC

## 2011-06-10 NOTE — Assessment & Plan Note (Signed)
Advised patient that it may be too difficult to quit until she has surgery and pain is under control. Should stop chantix since it was first prescribed in November.

## 2011-06-10 NOTE — Assessment & Plan Note (Signed)
Patient says she has worsened discharge and pain in eye and she feels she would benefit from antibiotics. Referred to [facility] since she was seen there in the past. Is having surgery 6/19 to return prosthesis. Refilled norco # 60 and stopped tramadol.

## 2011-06-10 NOTE — Patient Instructions (Addendum)
-  we will call you with appt for [facility] -come back for pap smear in 6 weeks

## 2011-06-10 NOTE — Progress Notes (Signed)
  Subjective:   Patient ID: Stephanie Padilla female   DOB: October 01, 1965 46 y.o.   MRN: [id number]  HPI: [name] is a 78 y.o. woman who is here for pain in eye socket, has a surgery to have ocular implant 6/19.  Tobacco- has been taking chantix 2 times daily as prescribed but is still feeling like she wants to smoke. Smoking about 1/2 pack a day, was smoking 5-6 cigarettes a day previously. She felt like chantix worked very well for her in the past and made her not want to smoke in the past. Had quit for over a year in the past- about a year ago. Started smoking in the setting of eye pain. Thinks she has tried wellbutrin in the past.  Past Medical History  Diagnosis Date  . Depression   . Chronic abdominal pain     Chronic Abd distension / bloating. W/U includes CT ABD & pelvis 7/08 negative. EGD 5/09 Dr Evette Cristal :erythematous gastric mucosa and bx c/w chronic gastritis - was neg for H pylori. Duo polyp had path c/w peptic duodenitis  . Spontaneous abortion     2   Current Outpatient Prescriptions  Medication Sig Dispense Refill  . HYDROcodone-acetaminophen (VICODIN) 5-500 MG per tablet Take 1 tablet by mouth every 6 (six) hours as needed. For pain.      . mirtazapine (REMERON) 15 MG tablet Take 1 tablet (15 mg total) by mouth at bedtime.  31 tablet  0  . pantoprazole (PROTONIX) 40 MG tablet Take by mouth daily.      . traMADol (ULTRAM) 50 MG tablet Take 50 mg by mouth every 6 (six) hours as needed. For pain.      . varenicline (CHANTIX CONTINUING MONTH PAK) 1 MG tablet Take 1 tablet (1 mg total) by mouth 2 (two) times daily.  60 tablet  2   Family History  Problem Relation Age of Onset  . Cancer Mother 67    presumed breast ca   History   Social History  . Marital Status: Single    Spouse Name: N/A    Number of Children: N/A  . Years of Education: N/A   Social History Main Topics  . Smoking status: Current Everyday Smoker -- 0.4 packs/day    Types: Cigarettes  .  Smokeless tobacco: None   Comment: restarted. chantix not working  . Alcohol Use: No  . Drug Use: No  . Sexually Active: Yes    Birth Control/ Protection: None   Other Topics Concern  . None   Social History Narrative   Lives with son. 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 Has medicaid. Got GED. Smoked cigs 10-15 yrs 1PPD> Restarted 3 months in 2011 but quit after 3 months.    Objective:  Physical Exam: Filed Vitals:   06/10/11 1551  BP: 108/66  Pulse: 90  Temp: 99.4 F (37.4 C)  TempSrc: Oral  Height: 5\' 4"  (1.626 m)  Weight: 117 lb 3.2 oz (53.162 kg)  SpO2: 100%   General: thin middle aged woman sitting in chair HEENT: white mucus drainage around eye, pink tissue in lateral upper corner of left eye socket, pigmentation of tissue- grayish centrally. Pain with looking up and to the left. Ext: warm and well perfused, no pedal edema Neuro: alert and oriented X3, cranial nerves II-XII grossly intact  Assessment & Plan:

## 2011-06-19 ENCOUNTER — Telehealth: Payer: Self-pay | Admitting: *Deleted

## 2011-06-19 NOTE — Telephone Encounter (Signed)
Call to [facility] for an appointment for pt's eye infection.  Office was unable to see pt until 07/01/2011.  Call to pt to see who is doing her surgery at [facility].  Pt said that her surgery that was scheduled in June has been rescheduled and that she will not have the surgery until July.  Pt said that she is still having the drainage in her eye and is now having some pain as well.   Call to [facility] to see if the physician there who is doing her Eye Surgery could see pt.  Spoke with the office there about the pt's need to be seen for an infection in her eye.  Office said that pt is already on an antibiotic-Erythromycin for her eye.  Spoke with patient said that she has never used the Erythromycin Ointment.  York Spaniel that she is only using Eye drops for moisture.  Spoke with [facility] again said that in there notes pt was to continue the Erythromycin Ointment.  Dois Davenport said that the office will refill the Erythromycin for the patient if needed.  Call to pt's pharmacy [facility] on [address].  Pt last got the medication in March and has refills.  Pt denies ever getting the prescription.  Pt was advised to go to [facility] to get the Erythromycin Ointment and to use as prescribed.  Pt said that she will go to the [facility] and pick up the prescription.  Pt to call if problem does not go away in a few days.  Angelina Ok, RN 06/19/2011 12:14 PM

## 2011-08-20 ENCOUNTER — Other Ambulatory Visit: Payer: Self-pay | Admitting: *Deleted

## 2011-08-21 MED ORDER — PANTOPRAZOLE SODIUM 40 MG PO TBEC: 40.0000 mg | DELAYED_RELEASE_TABLET | Freq: Every day | ORAL | Status: DC

## 2012-01-18 ENCOUNTER — Encounter: Payer: Self-pay | Admitting: Internal Medicine

## 2012-01-18 ENCOUNTER — Other Ambulatory Visit (HOSPITAL_COMMUNITY)
Admission: RE | Admit: 2012-01-18 | Discharge: 2012-01-18 | Disposition: A | Discharge status: Home / Self Care | Payer: Medicaid Other | Source: Ambulatory Visit | Attending: Internal Medicine | Admitting: Internal Medicine

## 2012-01-18 ENCOUNTER — Ambulatory Visit (INDEPENDENT_AMBULATORY_CARE_PROVIDER_SITE_OTHER): Payer: Medicaid Other | Admitting: Internal Medicine

## 2012-01-18 VITALS — BP 119/76 | HR 77 | Temp 98.8°F | Ht 64.0 in | Wt 138.0 lb

## 2012-01-18 DIAGNOSIS — Z01419 Encounter for gynecological examination (general) (routine) without abnormal findings: Secondary | ICD-10-CM | POA: Insufficient documentation

## 2012-01-18 DIAGNOSIS — N760 Acute vaginitis: Secondary | ICD-10-CM | POA: Insufficient documentation

## 2012-01-18 DIAGNOSIS — F17200 Nicotine dependence, unspecified, uncomplicated: Secondary | ICD-10-CM

## 2012-01-18 DIAGNOSIS — Z79899 Other long term (current) drug therapy: Secondary | ICD-10-CM

## 2012-01-18 DIAGNOSIS — K649 Unspecified hemorrhoids: Secondary | ICD-10-CM | POA: Insufficient documentation

## 2012-01-18 DIAGNOSIS — Z1151 Encounter for screening for human papillomavirus (HPV): Secondary | ICD-10-CM | POA: Insufficient documentation

## 2012-01-18 DIAGNOSIS — Z113 Encounter for screening for infections with a predominantly sexual mode of transmission: Secondary | ICD-10-CM | POA: Insufficient documentation

## 2012-01-18 DIAGNOSIS — R739 Hyperglycemia, unspecified: Secondary | ICD-10-CM

## 2012-01-18 DIAGNOSIS — Z124 Encounter for screening for malignant neoplasm of cervix: Secondary | ICD-10-CM

## 2012-01-18 DIAGNOSIS — Z0000 Encounter for general adult medical examination without abnormal findings: Secondary | ICD-10-CM

## 2012-01-18 DIAGNOSIS — R7309 Other abnormal glucose: Secondary | ICD-10-CM

## 2012-01-18 DIAGNOSIS — Z23 Encounter for immunization: Secondary | ICD-10-CM

## 2012-01-18 HISTORY — DX: Unspecified hemorrhoids: K64.9

## 2012-01-18 LAB — GLUCOSE, CAPILLARY: Glucose-Capillary: 103 mg/dL — ABNORMAL HIGH (ref 70–99)

## 2012-01-18 MED ORDER — NICOTINE 14 MG/24HR TD PT24: 1.0000 | MEDICATED_PATCH | TRANSDERMAL | Status: DC

## 2012-01-18 NOTE — Progress Notes (Signed)
Subjective:   Patient ID: Stephanie Padilla female   DOB: Jul 24, 1965 46 y.o.   MRN: [id number]  HPI: Stephanie Padilla is a 50 y.o. woman with PMH of depression and Tobacco abuse who presents to the clinic for follow up visit and Pa smear.   1. Hyperglycemia Patient noted to have a fasting blood sugar 100-125.  She is asymptomatic. we'll check hemoglobin A1c  2. tobacco abuse Continue to smoke cigarettes 10 a day.  She has tried Chantix in the past without much improvement.  She is waiting to try nicotine patch will give her nicotine patch Rx.  Social worker referral  3. Hemorrhoids Patient's reports rectal itching and the feeling heaviness at times.  Denies rectal bleeding.  She states that she tried over-the-counter creams and has in the past without much improvement.  Patient would like to know her colonoscopy report in 2009 and request GI referral for hemorrhoids.   Health maintenance 1. Tetanus shot given today. 2. Pap smear done today.  Brown vaginal discharge noted, wet prep, Chlamydia and gonorrhea 3. influenza vaccine declined 4. mammograms sent 5. colonoscopy 2009--> will obtain report   Past Medical History  Diagnosis Date  . Depression   . Chronic abdominal pain     Chronic Abd distension / bloating. W/U includes CT ABD & pelvis 7/08 negative. EGD 5/09 Dr Evette Cristal :erythematous gastric mucosa and bx c/w chronic gastritis - was neg for H pylori. Duo polyp had path c/w peptic duodenitis  . Spontaneous abortion     2   Current Outpatient Prescriptions  Medication Sig Dispense Refill  . mirtazapine (REMERON) 30 MG tablet Take 1 tablet (30 mg total) by mouth at bedtime.  60 tablet  3  . nicotine (NICODERM CQ - DOSED IN MG/24 HOURS) 14 mg/24hr patch Place 1 patch onto the skin daily.  30 patch  0  . pantoprazole (PROTONIX) 40 MG tablet Take 1 tablet (40 mg total) by mouth daily.  31 tablet  11  . traMADol (ULTRAM) 50 MG tablet Take 50 mg by mouth every 6 (six) hours as  needed. For pain.      . varenicline (CHANTIX CONTINUING MONTH PAK) 1 MG tablet Take 1 tablet (1 mg total) by mouth 2 (two) times daily.  60 tablet  2   Family History  Problem Relation Age of Onset  . Cancer Mother 89    presumed breast ca   History   Social History  . Marital Status: Single    Spouse Name: N/A    Number of Children: N/A  . Years of Education: N/A   Social History Main Topics  . Smoking status: Current Every Day Smoker -- 0.5 packs/day    Types: Cigarettes  . Smokeless tobacco: None     Comment: restarted. chantix not working  . Alcohol Use: No  . Drug Use: No  . Sexually Active: Yes    Birth Control/ Protection: None   Other Topics Concern  . None   Social History Narrative   Lives with son. 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 Has medicaid. Got GED. Smoked cigs 10-15 yrs 1PPD> Restarted 3 months in 2011 but quit after 3 months.    Objective:  Physical Exam: Filed Vitals:   01/18/12 0848  BP: 119/76  Pulse: 77  Temp: 98.8 F (37.1 C)  TempSrc: Oral  Height: 5\' 4"  (1.626 m)  Weight: 138 lb (62.596 kg)  SpO2: 100%   General: alert, well-developed, and cooperative to examination.  Head: normocephalic and atraumatic.  Eyes: vision grossly intact, pupils equal, pupils round, pupils reactive to light, no injection and anicteric.  Mouth: pharynx pink and moist, no erythema, and no exudates.  Neck: supple, full ROM, no thyromegaly, no JVD, and no carotid bruits.  Chest: Bilateral breast exam performed per patient request.  No abnormality noted.  No lymphadenopathy or nipple discharge.  Lungs: normal respiratory effort, no accessory muscle use, normal breath sounds, no crackles, and no wheezes. Heart: normal rate, regular rhythm, no murmur, no gallop, and no rub.  Abdomen: soft, non-tender, normal bowel sounds, no distention, no guarding, no rebound tenderness, no hepatomegaly, and no splenomegaly.  Msk: no joint swelling, no joint warmth,  and no redness over joints.  Pulses: 2+ DP/PT pulses bilaterally Extremities: No cyanosis, clubbing, edema Neurologic: alert & oriented X3, cranial nerves II-XII intact, strength normal in all extremities, sensation intact to light touch, and gait normal.  Skin: turgor normal and no rashes.  Psych: Oriented X3, memory intact for recent and remote, normally interactive, good eye contact, not anxious appearing, and not depressed appearing. GU: normal external genitalia, normal-appearing vaginal vault, and endo & exocervical Pap obtained, bimanual exam without abnormality as is rectal-vaginal wall. External hemorrhoids noted. No localized redness or thrombotic changes. Rectal exam is done and unable to appreciate anatomical structures due to fecal materials.      Assessment & Plan:

## 2012-01-18 NOTE — Assessment & Plan Note (Signed)
Mild external hemorrhoids noted without complications.  Unable to appreciate internal hemorrhoids the rectal exam due to fecal materials  - Patient declined any medical treatment at this moment. - Will obtain a colonoscopy report -Patient requests to be referred to GI.  Will defer the GI referral until colonoscopy report obtained

## 2012-01-18 NOTE — Assessment & Plan Note (Addendum)
Continue to abuse tobacco cigarettes 10 a day.  Tried Chantix and Wellbutrin the past per patient report without much improvement.  Assessment:  Progress toward smoking cessation:  smoking the same amount  Barriers to progress toward smoking cessation:  stress    Plan:  Instruction/counseling given:  I counseled patient on the dangers of tobacco use and advised patient to stop smoking.  Educational resources provided:  [facility] (1-800-QUIT-NOW) brochure  Self management tools provided:     Medications to assist with smoking cessation:  Nicotine Patch Patient agreed to the following self-care plans for smoking cessation:  go to the [employer] ([email or web address])   Other: Child psychotherapist

## 2012-01-18 NOTE — Assessment & Plan Note (Signed)
Patient is asymptomatic.  Fasting blood sugar noted to be mildly elevated.  -Will check her hemoglobin A1c today.

## 2012-01-18 NOTE — Addendum Note (Signed)
Addended by: Angelina Ok F on: 01/18/2012 02:19 PM   Modules accepted: Orders

## 2012-01-18 NOTE — Patient Instructions (Addendum)
1. Follow up in 3 months 2. Will send for Mammogram   General Instructions:    Treatment Goals:  Goals (1 Years of Data) as of 01/18/2012    None      Progress Toward Treatment Goals:  Treatment Goal 01/18/2012  Stop smoking smoking the same amount    Self Care Goals & Plans:  Self Care Goal 01/18/2012  Manage my medications take my medicines as prescribed; bring my medications to every visit; refill my medications on time  Eat healthy foods drink diet soda or water instead of juice or soda; eat more vegetables; eat foods that are low in salt  Stop smoking go to the [employer] ([email or web address])       Care Management & Community Referrals:  Referral 01/18/2012  Referrals made for care management support social worker

## 2012-01-19 ENCOUNTER — Encounter: Payer: Self-pay | Admitting: Internal Medicine

## 2012-01-19 NOTE — Progress Notes (Signed)
Patient ID: Stephanie Padilla, female   DOB: [birth year], 46 y.o.   MRN: [id number] Colonoscopy report 05/24/2007, see Media

## 2012-02-02 ENCOUNTER — Telehealth: Payer: Self-pay | Admitting: Licensed Clinical Social Worker

## 2012-02-02 NOTE — Telephone Encounter (Signed)
Ms. Stephanie Padilla was referred to CSW for smoking cessation.  During last [facility] visit, pt received Rx for nicotine patches.  CSW placed call to Ms. Stephanie Padilla.  Pt was able to get Rx filled and is current no longer smoking.  CSW provided encouragement for pt to remain smoke free!  Pt inquiring with CSW regarding PCP follow up for hemorrhoids and mammogram.  CSW informed Ms. Stephanie Padilla, CSW will send note to PCP regarding hemorrhoid f/u and informed pt of scheduled mammogram.  Pt states she will call and schedule her 3 mth f/u with PCP.

## 2012-02-03 ENCOUNTER — Telehealth: Payer: Self-pay | Admitting: Internal Medicine

## 2012-02-03 DIAGNOSIS — K649 Unspecified hemorrhoids: Secondary | ICD-10-CM

## 2012-02-03 NOTE — Telephone Encounter (Signed)
erro  neous encounter

## 2012-02-09 NOTE — Telephone Encounter (Signed)
Patient denies any medical treatment for her hemorrhoids and requests GI referral for evaluation.  I would decline the referral and she can contact the GI herself since she had GI evaluation of colonoscopy in 2009.   I called and left the message for her to call you. Please let her know. Thank you.

## 2012-03-07 ENCOUNTER — Other Ambulatory Visit: Payer: Self-pay | Admitting: *Deleted

## 2012-03-08 MED ORDER — MIRTAZAPINE 30 MG PO TABS: 30.0000 mg | ORAL_TABLET | Freq: Every day | ORAL | Status: DC

## 2012-03-31 ENCOUNTER — Ambulatory Visit (HOSPITAL_COMMUNITY): Payer: Medicaid Other

## 2012-03-31 ENCOUNTER — Encounter: Payer: Medicaid Other | Admitting: Internal Medicine

## 2012-04-06 ENCOUNTER — Ambulatory Visit (HOSPITAL_COMMUNITY)
Admission: RE | Admit: 2012-04-06 | Discharge: 2012-04-06 | Disposition: A | Discharge status: Home / Self Care | Payer: Medicaid Other | Source: Ambulatory Visit | Attending: Internal Medicine | Admitting: Internal Medicine

## 2012-04-06 DIAGNOSIS — Z1231 Encounter for screening mammogram for malignant neoplasm of breast: Secondary | ICD-10-CM | POA: Insufficient documentation

## 2012-04-06 DIAGNOSIS — Z0000 Encounter for general adult medical examination without abnormal findings: Secondary | ICD-10-CM

## 2012-04-21 ENCOUNTER — Encounter: Payer: Self-pay | Admitting: Internal Medicine

## 2012-04-21 ENCOUNTER — Ambulatory Visit (INDEPENDENT_AMBULATORY_CARE_PROVIDER_SITE_OTHER): Payer: Medicaid Other | Admitting: Internal Medicine

## 2012-04-21 VITALS — BP 117/79 | HR 81 | Temp 98.1°F | Ht 64.0 in | Wt 139.0 lb

## 2012-04-21 DIAGNOSIS — E785 Hyperlipidemia, unspecified: Secondary | ICD-10-CM

## 2012-04-21 DIAGNOSIS — F17200 Nicotine dependence, unspecified, uncomplicated: Secondary | ICD-10-CM

## 2012-04-21 DIAGNOSIS — Z0000 Encounter for general adult medical examination without abnormal findings: Secondary | ICD-10-CM | POA: Insufficient documentation

## 2012-04-21 DIAGNOSIS — F1210 Cannabis abuse, uncomplicated: Secondary | ICD-10-CM

## 2012-04-21 DIAGNOSIS — K625 Hemorrhage of anus and rectum: Secondary | ICD-10-CM

## 2012-04-21 DIAGNOSIS — F329 Major depressive disorder, single episode, unspecified: Secondary | ICD-10-CM

## 2012-04-21 LAB — CBC
Hemoglobin: 13.5 g/dL (ref 12.0–15.0)
MCH: 31.5 pg (ref 26.0–34.0)
Platelets: 314 10*3/uL (ref 150–400)
RBC: 4.29 MIL/uL (ref 3.87–5.11)
WBC: 5.2 10*3/uL (ref 4.0–10.5)

## 2012-04-21 LAB — COMPLETE METABOLIC PANEL WITH GFR
ALT: 8 U/L (ref 0–35)
Albumin: 5.1 g/dL (ref 3.5–5.2)
CO2: 26 mEq/L (ref 19–32)
Calcium: 9.6 mg/dL (ref 8.4–10.5)
Chloride: 106 mEq/L (ref 96–112)
GFR, Est African American: >89 mL/min
GFR, Est Non African American: >89 mL/min
Glucose, Bld: 80 mg/dL (ref 70–99)
Potassium: 3.9 mEq/L (ref 3.5–5.3)
Sodium: 141 mEq/L (ref 135–145)
Total Bilirubin: 0.5 mg/dL (ref 0.3–1.2)
Total Protein: 6.6 g/dL (ref 6.0–8.3)

## 2012-04-21 LAB — LIPID PANEL
HDL: 74 mg/dL (ref >39)
LDL Cholesterol: 123 mg/dL — ABNORMAL HIGH (ref 0–99)

## 2012-04-21 MED ORDER — NICOTINE 14 MG/24HR TD PT24: 1.0000 | MEDICATED_PATCH | TRANSDERMAL | Status: DC

## 2012-04-21 NOTE — Assessment & Plan Note (Addendum)
  Assessment:  Progress toward smoking cessation:  smoking the same amount  Barriers to progress toward smoking cessation:  stress;lack of motivation to quit  Comments:   Plan:  Instruction/counseling given:  I counseled patient on the dangers of tobacco use, advised patient to stop smoking and reviewed strategies to maximize success.  Educational resources provided:  [employer]) brochure  Self management tools provided:  smoking cessation plan (STAR Quit Plan);smoking cessation journal/diary  Medications to assist with smoking cessation:  Nicotine Patch Patient agreed to the following self-care plans for smoking cessation:  go to the [employer] ([employer])   Other:

## 2012-04-21 NOTE — Progress Notes (Signed)
Subjective:   Patient ID: Stephanie Padilla female   DOB: 12-19-1965 47 y.o.   MRN: [id number]  HPI: [name] is a 33 y.o. woman with PMH of depression and Tobacco abuse who presents to the clinic for follow up visit and Pa smear.    1. Rectal pain/Hemorrhoids Patient's reports rectal itching/aching/throbbing pain and the feeling heaviness at times.  She admits seeing blood in the toilet at times ( twice a month) and most recent rectal bleeding was 4 days ago. She states that she tried over-the-counter creams and has in the past without much improvement.  Last coloscopy was in 2009 and results reviewed and unremarkable.   2. Tobacco/marijunana abuse Continue to smoke cigarettes 10 a day.  She has tried Chantix in the past without much improvement.  She is willing to  try nicotine patch.  She continues to use Marijuana monthly. Last Marijuana abuse was 2 weeks.  .  3. Depression Good controlled.   Health maintenance uptodate Will perform yearly check of CMP, CBC, Lipid panel.   Review of Systems:  Constitutional:  Denies fever, chills, diaphoresis, appetite change and fatigue.   HEENT:  Denies congestion, sore throat, rhinorrhea, sneezing, mouth sores, trouble swallowing, neck pain   Respiratory:  Denies SOB, DOE, cough, and wheezing.   Cardiovascular:  Denies palpitations and leg swelling.   Gastrointestinal:  Denies nausea, vomiting, abdominal pain, diarrhea, constipation, and abdominal distention. Positive rectal pain and bleeding.   Genitourinary:  Denies dysuria, urgency, frequency, hematuria, flank pain and difficulty urinating.   Musculoskeletal:  Denies myalgias, back pain, joint swelling, arthralgias and gait problem.   Skin:  Denies pallor, rash and wound.   Neurological:  Denies dizziness, seizures, syncope, weakness, light-headedness, numbness and headaches.    .   Past Medical History  Diagnosis Date  . Depression   . Chronic abdominal pain     Chronic  Abd distension / bloating. W/U includes CT ABD & pelvis 7/08 negative. EGD 5/09 Dr Evette Cristal :erythematous gastric mucosa and bx c/w chronic gastritis - was neg for H pylori. Duo polyp had path c/w peptic duodenitis  . Spontaneous abortion     2  . ALLERGIC RHINITIS 10/10/2009    Qualifier: Diagnosis of  By: Saralyn Pilar DO, Burnett Harry    . CANNABIS ABUSE 04/17/2008    Qualifier: Diagnosis of  By: Andrey Campanile  MD, Vikki Ports    . DEPRESSION 08/05/2006    Qualifier: Diagnosis of  By: Andrey Campanile  MD, Vikki Ports    . GERD 08/05/2006    Qualifier: Diagnosis of  By: Andrey Campanile  MD, Vikki Ports    . Goiter, unspecified 10/16/2009    Qualifier: Diagnosis of  By: Saralyn Pilar DO, Burnett Harry    . Hemorrhoids 01/18/2012  . INSOMNIA 10/15/2008    Qualifier: Diagnosis of  By: Andrey Campanile  MD, Vikki Ports    . Irritable bowel syndrome 08/06/2009    Qualifier: Diagnosis of  By: Lyn Hollingshead    . NICOTINE ADDICTION 07/06/2008    Started on Chantix.    . 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 Radiculopathy of arm 11/26/2010   Current Outpatient Prescriptions  Medication Sig Dispense Refill  . mirtazapine (REMERON) 30 MG tablet Take 1 tablet (30 mg total) by mouth at bedtime.  60 tablet  3  . nicotine (NICODERM CQ - DOSED IN MG/24 HOURS) 14 mg/24hr patch Place 1 patch onto the skin daily.  30 patch  0  . pantoprazole (PROTONIX) 40 MG tablet Take 1 tablet (40 mg total) by  mouth daily.  31 tablet  11   No current facility-administered medications for this visit.   Family History  Problem Relation Age of Onset  . Cancer Mother 51    presumed breast ca   History   Social History  . Marital Status: Single    Spouse Name: N/A    Number of Children: N/A  . Years of Education: N/A   Social History Main Topics  . Smoking status: Current Every Day Smoker -- 0.50 packs/day    Types: Cigarettes  . Smokeless tobacco: Not on file     Comment: restarted. chantix not working  . Alcohol Use: No  . Drug Use: No  . Sexually Active: Yes    Birth Control/  Protection: None   Other Topics Concern  . Not on file   Social History Narrative   Lives with son. 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 Has medicaid. Got GED. Smoked cigs 10-15 yrs 1PPD> Restarted 3 months in 2011 but quit after 3 months.    Objective:  Physical Exam: Filed Vitals:   04/21/12 1325  BP: 117/79  Pulse: 81  Temp: 98.1 F (36.7 C)  TempSrc: Oral  Height: 5\' 4"  (1.626 m)  Weight: 139 lb (63.05 kg)  SpO2: 100%   General: alert, well-developed, and cooperative to examination.  Head: normocephalic and atraumatic.  Eyes: vision grossly intact, pupils equal, pupils round, pupils reactive to light, no injection and anicteric.  Mouth: pharynx pink and moist, no erythema, and no exudates.  Neck: supple, full ROM, no thyromegaly, no JVD, and no carotid bruits.  Lungs: normal respiratory effort, no accessory muscle use, normal breath sounds, no crackles, and no wheezes. Heart: normal rate, regular rhythm, no murmur, no gallop, and no rub.  Abdomen: soft, non-tender, normal bowel sounds, no distention, no guarding, no rebound tenderness, no hepatomegaly, and no splenomegaly.  Msk: no joint swelling, no joint warmth, and no redness over joints.  Pulses: 2+ DP/PT pulses bilaterally Extremities: No cyanosis, clubbing, edema Neurologic: alert & oriented X3, cranial nerves II-XII intact, strength normal in all extremities, sensation intact to light touch, and gait normal.  Skin: turgor normal and no rashes.  Psych: Oriented X3, memory intact for recent and remote, normally interactive, good eye contact, not anxious appearing, and not depressed appearing.  DENIES RECTAL EXAMINATION     Assessment & Plan:

## 2012-04-21 NOTE — Assessment & Plan Note (Addendum)
Patient reports chronic rectal pain/heaviness and was evaluated in this clinic on 01/18/12. She was noted to have mild external hemorrhoids and internal hemorrhoids were not appreciated by digital rectal exam. We were able to obtain her colonoscopy done in 2009, which was unremarkable except for 1 sessile polyp.   Patient presents to the clinic for followup visit.  She states that she continues to have a singular rectal pain/heaviness and occasional rectal bleeding with bowel movement.  At her last rectal bleeding was 4 days ago when she saw some bright red blood in the toilet.  Denies fever, chills palpitation, abdominal pain, diarrhea, constipation, and weakness.  Denies night sweats, appetite or weight change.  Examination is unremarkable and she denies rectal exam.  The differentiation diagnosis for rectal bleeding including hemorrhoids, infectious etiologies which are unlikely without systemic signs and symptoms, localized trauma or injury which she denies, anal or rectal cancer.  Sigmoid diverticulosis could cause rectal bleeding, however patient's localized rectal itching/pain/heaviness make this diagnosis unlikely.  - Recommend her to see her GI Doctor for further evaluation.  She may need sigmoidoscopy. - will send referral today

## 2012-04-21 NOTE — Assessment & Plan Note (Signed)
She states that she does not abuse marijuana as she use to.  She understands the risk of marijuana abuse including cardiovascular risk and sudden cardiac death.  - stop marijuana

## 2012-04-21 NOTE — Assessment & Plan Note (Signed)
States that she is doing well on Remeron.  Denies suicidal or homicidal ideations. -Continue Remeron

## 2012-04-21 NOTE — Patient Instructions (Addendum)
1. Follow up in 6 months 2. Will send GI referral General Instructions:    Treatment Goals:  Goals (1 Years of Data) as of 04/21/12     Lifestyle    . Quit smoking / using tobacco       Progress Toward Treatment Goals:  Treatment Goal 04/21/2012  Stop smoking smoking the same amount    Self Care Goals & Plans:  Self Care Goal 04/21/2012  Manage my medications take my medicines as prescribed; bring my medications to every visit; refill my medications on time  Eat healthy foods drink diet soda or water instead of juice or soda; eat more vegetables; eat foods that are low in salt  Stop smoking go to the [employer] ([employer])       Care Management & Community Referrals:  Referral 04/21/2012  Referrals made for care management support smoking cessation counselor; social worker  Referrals made to community resources smoking cessation

## 2012-04-22 DIAGNOSIS — E785 Hyperlipidemia, unspecified: Secondary | ICD-10-CM | POA: Insufficient documentation

## 2012-04-22 NOTE — Assessment & Plan Note (Signed)
Risk factors Age 47 No HTN HDL 74 remove one risk factor Current smoker No family hx of premature CHD  0 risk factor, LDL goal is < 160

## 2012-06-09 ENCOUNTER — Encounter: Payer: Self-pay | Admitting: Internal Medicine

## 2012-08-30 ENCOUNTER — Other Ambulatory Visit: Payer: Self-pay | Admitting: *Deleted

## 2012-08-30 MED ORDER — PANTOPRAZOLE SODIUM 40 MG PO TBEC: 40.0000 mg | DELAYED_RELEASE_TABLET | Freq: Every day | ORAL | Status: DC

## 2013-04-12 ENCOUNTER — Other Ambulatory Visit: Payer: Self-pay | Admitting: *Deleted

## 2013-04-14 NOTE — Telephone Encounter (Signed)
No answer at ph# 

## 2013-04-19 NOTE — Telephone Encounter (Signed)
I reviewed her medication fill history on Epic. Her last Rx was written on 03/07/12. It was # 60 tabs with 3 refills, which would last her from Feb 2014 to August 2014. There is no other recent refill Rx that I could find from Epic. If she indeed filled this Rx on 02/14/13, which is almost one year after Rx date, I am not sure whether she has been compliant with the meds.  Would you please call her pharmacy to clarify all of her refill history for 2014 and 2015? If she was not take the med for almost a year and recently started to take the med, she would need an evaluation at the clinic.  Thanks  Nicoletta Dress

## 2013-04-19 NOTE — Telephone Encounter (Signed)
Pharmacist stated she has just gotten the last of a script written in 2014, it was filled 02/14/2013 for a 15month supply and a 2 month supply usually last her almost 3 months, she does fill it on a regular basis. The script was written by you, dr Charlann Lange

## 2013-05-23 ENCOUNTER — Other Ambulatory Visit: Payer: Self-pay | Admitting: Internal Medicine

## 2013-05-23 DIAGNOSIS — Z1231 Encounter for screening mammogram for malignant neoplasm of breast: Secondary | ICD-10-CM

## 2013-05-25 ENCOUNTER — Ambulatory Visit (HOSPITAL_COMMUNITY)
Admission: RE | Admit: 2013-05-25 | Discharge: 2013-05-25 | Disposition: A | Discharge status: Home / Self Care | Payer: Medicaid Other | Source: Ambulatory Visit | Attending: Internal Medicine | Admitting: Internal Medicine

## 2013-05-25 DIAGNOSIS — Z1231 Encounter for screening mammogram for malignant neoplasm of breast: Secondary | ICD-10-CM

## 2013-06-21 ENCOUNTER — Other Ambulatory Visit (HOSPITAL_COMMUNITY)
Admission: RE | Admit: 2013-06-21 | Discharge: 2013-06-21 | Disposition: A | Discharge status: Home / Self Care | Payer: Medicaid Other | Source: Ambulatory Visit | Attending: Internal Medicine | Admitting: Internal Medicine

## 2013-06-21 ENCOUNTER — Encounter: Payer: Self-pay | Admitting: Internal Medicine

## 2013-06-21 ENCOUNTER — Ambulatory Visit (INDEPENDENT_AMBULATORY_CARE_PROVIDER_SITE_OTHER): Payer: Medicaid Other | Admitting: Internal Medicine

## 2013-06-21 VITALS — BP 105/72 | HR 63 | Temp 99.1°F | Ht 64.0 in | Wt 149.0 lb

## 2013-06-21 DIAGNOSIS — E049 Nontoxic goiter, unspecified: Secondary | ICD-10-CM

## 2013-06-21 DIAGNOSIS — R630 Anorexia: Secondary | ICD-10-CM

## 2013-06-21 DIAGNOSIS — A499 Bacterial infection, unspecified: Secondary | ICD-10-CM

## 2013-06-21 DIAGNOSIS — B9689 Other specified bacterial agents as the cause of diseases classified elsewhere: Secondary | ICD-10-CM

## 2013-06-21 DIAGNOSIS — N760 Acute vaginitis: Secondary | ICD-10-CM | POA: Insufficient documentation

## 2013-06-21 DIAGNOSIS — Z113 Encounter for screening for infections with a predominantly sexual mode of transmission: Secondary | ICD-10-CM | POA: Insufficient documentation

## 2013-06-21 LAB — POCT URINALYSIS DIPSTICK
BILIRUBIN UA: NEGATIVE
GLUCOSE UA: NEGATIVE
Ketones, UA: NEGATIVE
LEUKOCYTES UA: NEGATIVE
NITRITE UA: NEGATIVE
PH UA: 6
Protein, UA: NEGATIVE
Spec Grav, UA: 1.02
Urobilinogen, UA: 0.2

## 2013-06-21 LAB — POCT URINE PREGNANCY: PREG TEST UR: NEGATIVE

## 2013-06-21 LAB — TSH: TSH: 1.611 u[IU]/mL (ref 0.350–4.500)

## 2013-06-21 NOTE — Assessment & Plan Note (Signed)
Reports having an eating disorder. Was on remeron but has stopped due to bad dreams. Wishes to try something else and discuss with new pcp in detail. Reports decreased appetite and getting full from even small bite of food. Has success with [place] supplement she takes but too expensive.  -will defer to new pcp at this time as this needs to be evaluated further in a dedicated appointment

## 2013-06-21 NOTE — Patient Instructions (Signed)
We will call you with results of your tests  Please follow up with your appointment at GYN  Please stop smoking, call [phone] for further assistance  General Instructions:   Please bring your medicines with you each time you come to clinic.  Medicines may include prescription medications, over-the-counter medications, herbal remedies, eye drops, vitamins, or other pills.   Progress Toward Treatment Goals:  Treatment Goal 06/21/2013  Stop smoking smoking the same amount    Self Care Goals & Plans:  Self Care Goal 04/21/2012  Manage my medications take my medicines as prescribed; bring my medications to every visit; refill my medications on time  Eat healthy foods drink diet soda or water instead of juice or soda; eat more vegetables; eat foods that are low in salt  Stop smoking go to the [employer] (https://scott-booker.info/)    No flowsheet data found.   Care Management & Community Referrals:  Referral 04/21/2012  Referrals made for care management support smoking cessation counselor; social worker  Referrals made to community resources smoking cessation

## 2013-06-21 NOTE — Progress Notes (Signed)
Case discussed with Dr. Qureshi at the time of the visit.  We reviewed the resident's history and exam and pertinent patient test results.  I agree with the assessment, diagnosis, and plan of care documented in the resident's note. 

## 2013-06-21 NOTE — Progress Notes (Signed)
Subjective:   Patient ID: Stephanie Padilla female   DOB: [birth year] 47 y.o.   MRN: [id number]  HPI: [name] is a 61 y.o. African American female with PMH of Depression presenting to opc today for acute visit and complaints of vaginal discomfort x2 days. The pain started yesterday, felt inside her vagina, sharp, initially felt like she was having contractions.  Denies any abnormal discharge, odor, or bleeding. Last time she had sex was 4 days prior with her monogamous partner of 15+ years, no protection. LMP May 17th x3-4 days, heavier than usual, and has been having 2 periods a month for the past few months that is not usual for her. Usually only lasting 3-4 days.  G3P5, 2 miscarriages in the past usually within first trimester, no abortions, 3 living children.  Wishes to be checked for all STI's today.  Denies any recent trauma to area, no dysuria, and denies any major changes to diet or lifestyle.  She did restart a weight gain supplement from [facility] 3 weeks ago but she has used it before with no issues.  Reports being told she was starting menopause a few years ago given symptoms of hot flashes and back pain, but has not followed up with GYN.  She will need to do so.  Last papsmear wnl 2013.  Will repeat papsmear today. Urine dipsitick with trace-lysed blood. Negative nitrite or leukocytes. Urine preg neg.   Also, inquiring about her last colonoscopy.  Unable to view report, but seems to have had an EGD and colo in 2014 and says polyps were removed. She will need to call [facility] for follow up and we will try to request records as well.   Finally, near the end of the visit, she briefly mentioned wanted to try something else for appetite stimulant. She is currently on Remeron but has stopped taking it due to bad dreams.  She has been taking an expensive supplement from [facility] that is helping, but she cannot keep taking it due to cost. She endorses having an eating disorder where she does not  always wish to eat and when she does eat, she gets full after a few bites. Therefore, she will make a follow up appt with her new provider to discuss this further in detail sooner than later and also requests a female physician in the future.   Past Medical History  Diagnosis Date  . Depression   . Chronic abdominal pain     Chronic Abd distension / bloating. W/U includes CT ABD & pelvis 7/08 negative. EGD 5/09 Dr Penelope Coop :erythematous gastric mucosa and bx c/w chronic gastritis - was neg for H pylori. Duo polyp had path c/w peptic duodenitis  . Spontaneous abortion     2  . ALLERGIC RHINITIS 10/10/2009    Qualifier: Diagnosis of  By: Guy Sandifer DO, Darrick Penna    . CANNABIS ABUSE 04/17/2008    Qualifier: Diagnosis of  By: Redmond Pulling  MD, Mateo Flow    . DEPRESSION 08/05/2006    Qualifier: Diagnosis of  By: Redmond Pulling  MD, Mateo Flow    . GERD 08/05/2006    Qualifier: Diagnosis of  By: Redmond Pulling  MD, Mateo Flow    . Goiter, unspecified 10/16/2009    Qualifier: Diagnosis of  By: Guy Sandifer DO, Darrick Penna    . Hemorrhoids 01/18/2012  . INSOMNIA 10/15/2008    Qualifier: Diagnosis of  By: Redmond Pulling  MD, Mateo Flow    . Irritable bowel syndrome 08/06/2009    Qualifier: Diagnosis of  By: Criss Alvine    . NICOTINE ADDICTION 07/06/2008    Started on Chantix.    . 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 Radiculopathy of arm 11/26/2010   Current Outpatient Prescriptions  Medication Sig Dispense Refill  . mirtazapine (REMERON) 30 MG tablet Take 1 tablet (30 mg total) by mouth at bedtime.  60 tablet  3  . nicotine (NICODERM CQ - DOSED IN MG/24 HOURS) 14 mg/24hr patch Place 1 patch onto the skin daily.  30 patch  1  . pantoprazole (PROTONIX) 40 MG tablet Take 1 tablet (40 mg total) by mouth daily.  31 tablet  11   No current facility-administered medications for this visit.   Family History  Problem Relation Age of Onset  . Cancer Mother 58    presumed breast ca   History   Social History  . Marital Status: Single    Spouse  Name: N/A    Number of Children: N/A  . Years of Education: N/A   Social History Main Topics  . Smoking status: Current Every Day Smoker -- 0.50 packs/day    Types: Cigarettes  . Smokeless tobacco: None     Comment: restarted. chantix not working  . Alcohol Use: No  . Drug Use: No  . Sexual Activity: Yes    Birth Control/ Protection: None     Comment: unprotected, monogamous 15+ years, last 4 days ago   Other Topics Concern  . None   Social History Narrative   Lives with son. 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 In relationship. Has medicaid. Got GED. Smoked cigs 10-15 yrs 1PPD> Restarted 3 months in 2011 but quit after 3 months.   Review of Systems:  Constitutional:  Denies fever, chills. Decreased appetite, uses stimulants  HEENT:  Denies congestion  Respiratory:  Denies SOB  Cardiovascular:  Denies chest pain  Gastrointestinal:  Denies abdominal pain  Genitourinary:  Vaginal pain. Denies dysuria  Musculoskeletal:  Denies myalgias or gait problem   Skin:  Denies pallor, rash and wound.   Neurological:  Denies headaches   Objective:  Physical Exam: Filed Vitals:   06/21/13 1104  BP: 105/72  Pulse: 63  Temp: 99.1 F (37.3 C)  TempSrc: Oral  Height: 5\' 4"  (1.626 m)  Weight: 149 lb (67.586 kg)  SpO2: 99%   Vitals reviewed. General: sitting in chair, NAD HEENT: EOMI. No palpable thyroid mass Cardiac: RRR, no rubs, murmurs or gallops Breast: no palpable masses, areolar dark brown freckling, palpable areolar glands on b/l nipples, numerous, pt claims they get engorged at times, slight tenderness to deep palpation at fold of breast on left side base of breast >right Pulm: clear to auscultation bilaterally, no wheezes, rales, or rhonchi Abd: soft, nontender, nondistended, BS present Ext: warm and well perfused, no pedal edema, +2DP B/L GU: difficult to visualize cervix, thick white vaginal discharge noted, no blood Neuro: alert and oriented X3, strength and sensation  grossly intact  Assessment & Plan:  Discussed with Dr. Ellwood Dense STI testing including HSV and HIV Wet prep GYN referral

## 2013-06-21 NOTE — Assessment & Plan Note (Signed)
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 Currently reports feeling small bump on left side, but nothing palpable on mine and Dr. Delfino Lovett physical exam today.   -will check TSH -continue to monitor for now

## 2013-06-21 NOTE — Assessment & Plan Note (Addendum)
x2 days. Endorses pain/discomfort in vagina but denies any discharge, dysuria, or foul odor. Last sexual encounter 4 days prior, unprotected. Monogamous relationship. Urine dipstick with trace blood-lysed, negative leukocytes and nitrite. Noted to have thick white vaginal discharge on exam.   -referral for GYN placed -wet prep and GC probe urine testing done -hiv, and HSV pcr blood tests ordered as well -will call pt with results as soon as possible -advised to keep area clean for now  Addendum 06/22/13: HIV neg. HSV pending. +gardnerella. Will treat for BV with flagyl 500mg  po bid x7 days. Reviewed results with patient on the phone this morning. Candida and trich neg.

## 2013-06-22 ENCOUNTER — Encounter: Payer: Self-pay | Admitting: Internal Medicine

## 2013-06-22 LAB — HSV(HERPES SIMPLEX VRS) I + II AB-IGG
HSV 1 Glycoprotein G Ab, IgG: <0.1 IV
HSV 2 Glycoprotein G Ab, IgG: 10.29 IV — ABNORMAL HIGH

## 2013-06-22 LAB — HIV ANTIBODY (ROUTINE TESTING W REFLEX): HIV 1&2 Ab, 4th Generation: NONREACTIVE

## 2013-06-22 MED ORDER — METRONIDAZOLE 500 MG PO TABS: 500.0000 mg | ORAL_TABLET | Freq: Two times a day (BID) | ORAL | Status: AC

## 2013-06-22 NOTE — Addendum Note (Signed)
Addended by: Wilber Oliphant on: 06/22/2013 08:52 AM   Modules accepted: Orders

## 2013-07-26 ENCOUNTER — Encounter: Payer: Self-pay | Admitting: Family Medicine

## 2013-08-14 ENCOUNTER — Encounter: Payer: Medicaid Other | Admitting: Family Medicine

## 2013-09-27 ENCOUNTER — Ambulatory Visit (INDEPENDENT_AMBULATORY_CARE_PROVIDER_SITE_OTHER): Payer: Medicaid Other | Admitting: Obstetrics and Gynecology

## 2013-09-27 ENCOUNTER — Encounter: Payer: Self-pay | Admitting: Obstetrics and Gynecology

## 2013-09-27 VITALS — BP 110/73 | HR 58 | Temp 98.5°F | Ht 64.5 in | Wt 132.6 lb

## 2013-09-27 DIAGNOSIS — N760 Acute vaginitis: Secondary | ICD-10-CM

## 2013-09-27 MED ORDER — CLOBETASOL PROPIONATE 0.05 % EX OINT: 1.0000 "application " | TOPICAL_OINTMENT | Freq: Two times a day (BID) | CUTANEOUS | Status: DC

## 2013-09-27 NOTE — Progress Notes (Signed)
Patient ID: Stephanie Padilla, female   DOB: Jan 16, 1966, 48 y.o.   MRN: [id number] 48 yo here for the evaluation of vaginitis. Patient was seen in June for vaginitis and was treated with Flagyl for BV. Patient states that her symptoms resolved following treatment but seem to have returned. She describes vaginal irritation on the labia and introitus that occurs on and off. She denies any aggravating factors or any alleviating factors. She is sexually active using condoms. She tested negative for STD in June.  GENERAL: Well-developed, well-nourished female in no acute distress.  ABDOMEN: Soft, nontender, nondistended. No organomegaly. PELVIC: Normal external female genitalia. Vagina is pink and rugated.  Normal discharge. Normal appearing cervix. Uterus is normal in size. No adnexal mass or tenderness. EXTREMITIES: No cyanosis, clubbing, or edema, 2+ distal pulses.  A/P 48 yo with vaginitis - Discussed the possibility of recurrent BV - Wet prep collected - Rx clobetasol cream given in the meantime - Patient will be contacted with any abnormal results

## 2013-09-28 LAB — WET PREP, GENITAL
Clue Cells Wet Prep HPF POC: NONE SEEN
Trich, Wet Prep: NONE SEEN
WBC, Wet Prep HPF POC: NONE SEEN
Yeast Wet Prep HPF POC: NONE SEEN

## 2013-11-07 ENCOUNTER — Ambulatory Visit (INDEPENDENT_AMBULATORY_CARE_PROVIDER_SITE_OTHER): Payer: Medicaid Other | Admitting: Internal Medicine

## 2013-11-07 ENCOUNTER — Telehealth: Payer: Self-pay | Admitting: *Deleted

## 2013-11-07 ENCOUNTER — Encounter: Payer: Self-pay | Admitting: Internal Medicine

## 2013-11-07 VITALS — BP 108/74 | HR 73 | Temp 98.2°F | Ht 64.0 in | Wt 128.1 lb

## 2013-11-07 DIAGNOSIS — F4321 Adjustment disorder with depressed mood: Secondary | ICD-10-CM

## 2013-11-07 MED ORDER — ESCITALOPRAM OXALATE 10 MG PO TABS: 10.0000 mg | ORAL_TABLET | Freq: Every day | ORAL | Status: DC

## 2013-11-07 NOTE — Assessment & Plan Note (Addendum)
Pt appears depressed- Crying, anhedonia, depressed mood, reduced energy. Pt also endorses poor appetite. Stressor- Loss of job and long term relationship. Spent a good while listening to and talking to patient. Pt will like a help line number. Also will like to talk to social work, pt might benefit from psychotherapy. Pt has food stamps. Pt denied suicidal or homicidal ideation. Said she has not even though of that. Denies alcohol use, quit smoking months ago, denies drug use.  Plan- Escitalopram- 10mg  daily. - Consult to social work, for psychotherapy, help with getting a Job.  - Help line number-24 hr, [place] [facility] - [phone] and [place] [place] [facility]- 1 [phone]. - Pt encouraged to eat, engage in activities that she likes. - Pt said she had to go, and worked out, said she has to pick up heer son. Was typing up her AVS and putting in discharge meds, pt walked out. Will call pt with plan.  - See in 4 weeks, sooner if needed.   Addendum- Pt called pt is complaining of poor sleep and appetite. Will prescribe short course of Zolpidem- 5mg  at night. Told pt this not a permanent medication. Trazodone tried in the past- D/c unsure why. - Refferal to psych.

## 2013-11-07 NOTE — Telephone Encounter (Signed)
Pt calls and states she is very depressed, denies suicidal/ homicidal thoughts. States she cannot eat or function normally at this time, she insists that she be seen today, appt dr Denton Brick at [phone]

## 2013-11-07 NOTE — Progress Notes (Signed)
Patient ID: Stephanie Padilla, female   DOB: 12-Aug-1965, 48 y.o.   MRN: [id number]   Subjective:   Patient ID: Stephanie Padilla female   DOB: 1965-09-24 48 y.o.   MRN: [id number]  HPI: Stephanie Padilla is a 48 y.o. with PMH listed below. Presented today with complaints of being depressed for the past 3 weeks, since he lost her job- which was a part time job, and also her 15 year relationship ended 2 weeks ago. Pts said they grew apart, but she was reliant on him for the upkeep of her self and her 11 year old son, as she had just lost her job. Pt is really devastated by the end of this relationship, has not been out of her house in 2 weeks, has hardly eaten any meals for the past 2 weeks. Pt enjoys gardening and has done that in a while. No insomnia.  Was previously treated with rameron for poor appetite which she stopped because of bad dreams. Pt denies suicidal or homicidal ideation. Pt say she has no friends. Goes to church but hasnt in 2 weeks. Pt say she is embarrased to talk to anyone about what she is going through. Says her only family is her son.  Past Medical History  Diagnosis Date  . Depression   . Chronic abdominal pain     Chronic Abd distension / bloating. W/U includes CT ABD & pelvis 7/08 negative. EGD 5/09 Dr Penelope Coop :erythematous gastric mucosa and bx c/w chronic gastritis - was neg for H pylori. Duo polyp had path c/w peptic duodenitis  . Spontaneous abortion     2  . ALLERGIC RHINITIS 10/10/2009    Qualifier: Diagnosis of  By: Guy Sandifer DO, Darrick Penna    . CANNABIS ABUSE 04/17/2008    Qualifier: Diagnosis of  By: Redmond Pulling  MD, Mateo Flow    . DEPRESSION 08/05/2006    Qualifier: Diagnosis of  By: Redmond Pulling  MD, Mateo Flow    . GERD 08/05/2006    Qualifier: Diagnosis of  By: Redmond Pulling  MD, Mateo Flow    . Goiter, unspecified 10/16/2009    Qualifier: Diagnosis of  By: Guy Sandifer DO, Darrick Penna    . Hemorrhoids 01/18/2012  . INSOMNIA 10/15/2008    Qualifier: Diagnosis of  By: Redmond Pulling  MD,  Mateo Flow    . Irritable bowel syndrome 08/06/2009    Qualifier: Diagnosis of  By: Criss Alvine    . NICOTINE ADDICTION 07/06/2008    Started on Chantix.    . 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 Radiculopathy of arm 11/26/2010   Current Outpatient Prescriptions  Medication Sig Dispense Refill  . clobetasol ointment (TEMOVATE) 3.41 % Apply 1 application topically 2 (two) times daily.  30 g  2  . nicotine (NICODERM CQ - DOSED IN MG/24 HOURS) 14 mg/24hr patch Place 1 patch onto the skin daily.  30 patch  1  . pantoprazole (PROTONIX) 40 MG tablet Take 1 tablet (40 mg total) by mouth daily.  31 tablet  11   No current facility-administered medications for this visit.   Family History  Problem Relation Age of Onset  . Cancer Mother 87    presumed breast ca   History   Social History  . Marital Status: Single    Spouse Name: N/A    Number of Children: N/A  . Years of Education: N/A   Social History Main Topics  . Smoking status: Former Smoker -- 0.50 packs/day    Types: Cigarettes    Quit date: 01/27/2013  .  Smokeless tobacco: Never Used     Comment: restarted. chantix not working  . Alcohol Use: Yes     Comment: occasional  . Drug Use: No  . Sexual Activity: Yes    Birth Control/ Protection: None     Comment: unprotected, monogamous 15+ years, last 4 days ago   Other Topics Concern  . None   Social History Narrative   Lives with son. 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 In relationship. Has medicaid. Got GED. Smoked cigs 10-15 yrs 1PPD> Restarted 3 months in 2011 but quit after 3 months.   Review of Systems: CONSTITUTIONAL- No Fever, weightloss, night sweat, poor appetite. SKIN- No Rash, colour changes or itching. HEAD- No Headache or dizziness. Mouth/throat- No Sorethroat, dentures, or bleeding gums. RESPIRATORY- No Cough or SOB. CARDIAC- No Palpitations, DOE, PND or chest pain. GI- No nausea, vomiting, diarrhoea, constipation, abd pain. URINARY- No Frequency, urgency,  straining or dysuria. NEUROLOGIC- No Numbness, syncope, seizures or burning. PYSCH- Depressed  Objective:  Physical Exam: Filed Vitals:   11/07/13 1617  BP: 108/74  Pulse: 73  Temp: 98.2 F (36.8 C)  TempSrc: Oral  Height: 5\' 4"  (1.626 m)  Weight: 128 lb 1.6 oz (58.106 kg)  SpO2: 100%   GENERAL- Very tearful, cried throughout the visit, alert, co-operative.  HEENT- Atraumatic, normocephalic, PERRL,  neck supple. CARDIAC- RRR, no murmurs, rubs or gallops. RESP- Moving equal volumes of air, and clear to auscultation bilaterally, no wheezes or crackles. ABDOMEN- Soft, nontender, bowel sounds present. NEURO- OTPP, No obvious Cr N abnormality, strenght upper and lower extremities- 5/5, Gait- Normal. EXTREMITIES- pulse 2+, symmetric, no pedal edema. SKIN- Warm, dry, No rash or lesion. PSYCH- Affect congruent with depressed mood, tearful, judgement and insight intact, short and long term memorry appear intact, attention and concentration intact.  Assessment & Plan:   The patient's case and plan of care was discussed with attending physician, Dr. Eppie Gibson.  Please see problem based charting for assessment and plan.

## 2013-11-08 ENCOUNTER — Telehealth: Payer: Self-pay | Admitting: Licensed Clinical Social Worker

## 2013-11-08 NOTE — Progress Notes (Signed)
Case discussed with Dr. Denton Brick soon after the resident saw the patient (patient had left prior to end of appointment). We reviewed the resident's history and exam and pertinent patient test results. I agree with the assessment, diagnosis, and plan of care documented in the resident's note.

## 2013-11-08 NOTE — Telephone Encounter (Signed)
Ms. Stephanie Padilla was referred to CSW for mental health services and community resources for utilities and employment.  CSW placed call to Ms. Stephanie Padilla.  Pt states she did utilize the behavioral health phone numbers received by physician during yesterday's visit to speak with someone over the phone.  Pt did see therapist several times last year, but states it is difficult for her to open up.  Ms. Stephanie Padilla states she is not ready to start therapy at this time but is aware CSW is available to assist with scheduling and completing referral. Pt states her s/o was the primary financial provider and is no longer in the home.  Ms. Stephanie Padilla has a 32 year old son that lives with her, only source of income is from child support and receives food stamps.  Currently, pt is without oil to heat her home.  She has been using an electric heater at night staying in one room.  Pt has attempted [employer], no funds available.  CSW referred Ms. Stephanie Padilla to [facility].  Pt was familiar with [facility].  Pt's housing is not at risk.  CSW encouraged Ms. Stephanie Padilla to contact her Medicaid case worker in regards to the Work First program or if there are any programs available to assist with employment.  Pt agreeable but states transportation will be an issue d/t her inability to afford insurance and gas.  CSW informed Ms. Stephanie Padilla of Work Secondary school teacher through [employer].  Pt aware her Medicaid Caseworker will need to provide more information regarding the availability of this program for her.  CSW discussed referral to [facility], assist with additional community resources and care management.  Pt agreeable.  Referral faxed.   CSW provided Ms. Stephanie Padilla contact information and pt aware CSW is available to assist as needed.

## 2013-11-10 ENCOUNTER — Telehealth: Payer: Self-pay | Admitting: *Deleted

## 2013-11-10 ENCOUNTER — Other Ambulatory Visit: Payer: Self-pay | Admitting: Internal Medicine

## 2013-11-10 MED ORDER — ZOLPIDEM TARTRATE 5 MG PO TABS: 5.0000 mg | ORAL_TABLET | Freq: Every evening | ORAL | Status: DC | PRN

## 2013-11-10 NOTE — Telephone Encounter (Signed)
Dr Denton Brick said she will call Stephanie Padilla; Stephanie Padilla aware.

## 2013-11-10 NOTE — Telephone Encounter (Signed)
Call from pt - states Escitalopram is not helping with sleeping nor eating; she start taking the medication on Tuesday. She's only drinking Ensure; vomited x 1 yesterday. Wants to know is there something else she can take to solve her problems?  Thanks [phone]

## 2013-11-10 NOTE — Addendum Note (Signed)
Addended by: Bethena Roys on: 11/10/2013 04:58 PM   Modules accepted: Orders

## 2013-11-10 NOTE — Telephone Encounter (Signed)
Called patient back. She is requesting megace for her appetite. Pt has struggled with this- poor appetite and poor sleep for at least 5 years, per chart review. She has been tried on several antidepressant medications- Mirtazapine, trazodone, bupropion, varenicline, amitriptylline, venlafaxine, all discont for no response, but mostly side effects.   Megace was prescribed as a last ditch effort but this was later d/c as the provider she was seeing at the time did not feel it was the right medication for this pt.  Pt is saying this is the only medication that helped with her appetite, that her appetite was back the next day after starting it.   Talked to pt to try the antidepressant, and also take multivitamins, as per chart- [place] suppliments have helped in the past. Also pt refused psychotherapy. Pt is still insistent on taking this medication. I do not feel comfortable prescibing as it has potential side effects- Breast cancer, endometrial cancer and blood clots. She does not have HIV or cancer. Pt BMI has been within normal limits for the past 5 years, weight up and down but appear at baseline for her. Pt did not appear cachetic or wasted on exam. Counselled pt about this but she still insists on wanting this medication.  I will put in a refferal to Psych. 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 Also encouraged antidepressant and multivitamins.   Bing Neighbors, MD. PGY-2 IMTS.

## 2013-11-13 NOTE — Telephone Encounter (Signed)
Psych referral given to [facility], referral coordinator. Ambien rx called to [facility].

## 2013-11-15 NOTE — Telephone Encounter (Signed)
CSW placed call to Ms. Mellinger to follow up on referral to psychiatry placed on 11/10/13.  Pt aware of need to see psychiatry per physician regarding certain medications.  Pt agreeable.  CSW discussed options to schedule appointment, which would be approx 2-3 weeks out or utilize agencies with walk-in.  Pt prefers walk-in at this time.  CSW provided Ms. Weiss information to [facility], contact information and walk-in hours.  [facility] information provided as alternatives.  Pt states she will arrange transportation for Friday.  Should walk-in appointment not work, CSW encouraged pt to contact CSW should pt want assistance scheduling an appointment with another provider.  Pt states she left message for her Medicaid caseworker last week, but has yet to receive a return call.  CSW encouraged pt to contact managers number per DSS voicemail.  In addition, CSW will follow up with [facility] regarding assistance with oil.

## 2013-11-22 NOTE — Telephone Encounter (Signed)
[employer] indicates emergency assistance funds are low.

## 2013-11-28 ENCOUNTER — Encounter: Payer: Self-pay | Admitting: Internal Medicine

## 2013-11-28 ENCOUNTER — Ambulatory Visit (INDEPENDENT_AMBULATORY_CARE_PROVIDER_SITE_OTHER): Payer: Medicaid Other | Admitting: Internal Medicine

## 2013-11-28 VITALS — BP 117/77 | HR 77 | Temp 98.5°F | Ht 64.0 in | Wt 127.7 lb

## 2013-11-28 DIAGNOSIS — Z720 Tobacco use: Secondary | ICD-10-CM

## 2013-11-28 DIAGNOSIS — F32A Depression, unspecified: Secondary | ICD-10-CM

## 2013-11-28 DIAGNOSIS — F4321 Adjustment disorder with depressed mood: Secondary | ICD-10-CM

## 2013-11-28 DIAGNOSIS — F17200 Nicotine dependence, unspecified, uncomplicated: Secondary | ICD-10-CM

## 2013-11-28 DIAGNOSIS — K219 Gastro-esophageal reflux disease without esophagitis: Secondary | ICD-10-CM

## 2013-11-28 DIAGNOSIS — F329 Major depressive disorder, single episode, unspecified: Secondary | ICD-10-CM

## 2013-11-28 DIAGNOSIS — N898 Other specified noninflammatory disorders of vagina: Secondary | ICD-10-CM

## 2013-11-28 MED ORDER — PANTOPRAZOLE SODIUM 40 MG PO TBEC: 40.0000 mg | DELAYED_RELEASE_TABLET | Freq: Two times a day (BID) | ORAL | Status: DC

## 2013-11-28 MED ORDER — NICOTINE 14 MG/24HR TD PT24: 14.0000 mg | MEDICATED_PATCH | TRANSDERMAL | Status: DC

## 2013-11-28 MED ORDER — ESCITALOPRAM OXALATE 10 MG PO TABS: 10.0000 mg | ORAL_TABLET | Freq: Every day | ORAL | Status: DC

## 2013-11-28 NOTE — Patient Instructions (Signed)
It was a pleasure meeting you today, Ms. Jenifer.  Please continue taking Lexapro 10 mg daily. It will take a few weeks for this medication to start working.   You will be called to set up an appointment with the psychiatrist.   I have given you refills for your Protonix (acid reflux) medication and a Nicotine patch.   I have placed a referral to OBGYN. They will contact you for an appointment.  General Instructions:   Please bring your medicines with you each time you come to clinic.  Medicines may include prescription medications, over-the-counter medications, herbal remedies, eye drops, vitamins, or other pills.   Progress Toward Treatment Goals:  Treatment Goal 06/21/2013  Stop smoking smoking the same amount    Self Care Goals & Plans:  Self Care Goal 04/21/2012  Manage my medications take my medicines as prescribed; bring my medications to every visit; refill my medications on time  Eat healthy foods drink diet soda or water instead of juice or soda; eat more vegetables; eat foods that are low in salt  Stop smoking go to the [employer] (https://scott-booker.info/)    No flowsheet data found.   Care Management & Community Referrals:  Referral 04/21/2012  Referrals made for care management support smoking cessation counselor; social worker  Referrals made to community resources smoking cessation

## 2013-11-29 ENCOUNTER — Encounter: Payer: Self-pay | Admitting: Licensed Clinical Social Worker

## 2013-11-29 DIAGNOSIS — N898 Other specified noninflammatory disorders of vagina: Secondary | ICD-10-CM | POA: Insufficient documentation

## 2013-11-29 NOTE — Assessment & Plan Note (Signed)
Patient's depression symptoms seemed to improve while on Lexapro. I explained to her that it takes about 3-4 weeks for the medication to build up and start having an effect, and that it would be best for her to continue on the Lexapro. I also explained that it is unlikely that her vaginal discharge is related to her starting Lexapro, but that her depression can actually cause changes in her menstrual cycle. We also discussed that she may be going through menopause as well. We discussed that Lexapro should help improve her appetite as well. She understands and agrees that it would be best to restart Lexapro. She is also willing to see a psychiatrist, but would prefer one that is not at a walk-in clinic. - Resume Lexapro 10 mg daily - Referral to psychiatry, appreciate help from SW - Encouraged to continue using the help line and talking with her friends - Encouraged her to engage in activities she enjoys - Recommended to continue drinking protein shakes, but also eat other foods she enjoys - Will have patient follow up in 1 month, or sooner if needed

## 2013-11-29 NOTE — Progress Notes (Signed)
Stephanie Padilla presented to [facility] on 11/28/13.  PCP states pt does not want a walk-in agency for mental health services.  CSW faxed referral to [facility].

## 2013-11-29 NOTE — Assessment & Plan Note (Signed)
Pt reports brown vaginal discharge over last week. She has had this a few months ago and was evaluated at [facility]. She reports she has not been getting her menstrual period regularly for the past few months as well. I explained that her vaginal discharge is not related to her starting Lexapro. Likely going through menopause transition.  - Will refer to OBGYN/ [facility] so pt can be evaluated

## 2013-11-29 NOTE — Assessment & Plan Note (Signed)
Pt reports she started smoking again 2 weeks ago to help with her depression. She would like to quit before it becomes difficult for her. She states a nicotine patch helped her in the past. - Will start Nicotine patch - Evaluate smoking status at next visit - Encouraged her to quit and educated her on the risks of smoking

## 2013-11-29 NOTE — Assessment & Plan Note (Signed)
Pt reported she had been taking Protonix 40 mg twice daily to control her reflux symptoms. She was only prescribed 40 mg once daily. Will increase prescription to twice daily.

## 2013-11-29 NOTE — Progress Notes (Signed)
Subjective:    Patient ID: Stephanie Padilla, female    DOB: 1966/01/08, 48 y.o.   MRN: [id number]  HPI Stephanie Padilla is a 49yo woman w/ PMHx of anorexia and depression who presents today for follow up for her depression. Patient was seen in the [facility] on 10/20 for 3 week hx of depression after losing her job and ending a 15 year relationship. Patient was started on Lexapro 10 mg daily at that time and recommended to call the help line if needed.  Today, patient reports she is doing better than she was 3 weeks ago, but still feeling depressed. She states she was starting to feel better while taking Lexapro, but that she stopped taking the medication 4 days ago after noticing brown vaginal discharge. She reports she has had brown vaginal discharge a few months ago, before she was taking Lexapro, and was evaluated at [facility]. She reports she was told "everything was normal" and that she may be going through "hormonal changes" given her age. She states her vaginal discharge has been heavier the past week than it has in the past. She has not had any further follow up for this gynecological issue.  Patient reports she has been making an effort to open up to others and talk about what she is going through. She reports she has a few friends at church that she confides in. When asked about family, she states that "they don't care" and "I don't want my son to know what I'm going through." Her 81yo son is very important to her and she "wants to be strong for him." She reports she has used the help line number a few times and felt better after talking with someone. She states she is hesitant about seeing a psychiatrist because she is not sure how they will help her. We discussed about how psychiatrists specialize in depression and how it can be therapeutic to talk with someone who can have an objective opinion about what she is going through. She understands and is willing to try seeing a psychiatrist, but  would prefer that she sees one that is not at a  walk-in clinic.   She reports poor appetite, but notes she was able to eat a little bit better while on Lexapro. She states she has mostly been eating protein shakes (Ensure). She states she just does not feel hungry. She is worried about this because of her history of anorexia and doesn't want to have an "eating problem" again. She states she has also started smoking again. For the past 2 weeks she has been smoking 1/2 pack per day. She was previously a 1 ppd smoker before quitting. She states she would like to quit now before it becomes more difficult. She reports using a nicotine patch in the past which helped her quit. She reports poor sleep. She denies suicidal or homicidal ideation.   Review of Systems General: Denies fever, chills, night sweats HEENT: Denies headaches, ear pain, changes in vision, rhinorrhea, sore throat CV: Denies CP, palpitations, SOB, orthopnea Pulm: Denies SOB, cough, wheezing GI: Denies abdominal pain, nausea, vomiting, diarrhea, constipation, melena, hematochezia GU: Denies dysuria, hematuria, frequency Msk: Denies muscle cramps, joint pains Neuro: Denies weakness, numbness, tingling Skin: Denies rashes, bruising    Objective:   Physical Exam General: alert, appears depressed, sitting up in chair HEENT: [place]/AT, EOMI, PERRL, sclera anicteric, pharynx non-erythematous, mucus membranes moist Neck: supple, no JVD, no lymphadenopathy CV: RRR, normal S1/S2, no m/g/r Pulm: CTA  bilaterally, breaths non-labored, no wheezing Abd: BS+, soft, non-distended, non-tender Ext: warm, no edema, moves all Neuro: alert and oriented x 3, depressed mood, CNs II-XII intact, strength 5/5 in upper and lower extremities bilaterally       Assessment & Plan:

## 2013-12-03 NOTE — Progress Notes (Signed)
INTERNAL MEDICINE TEACHING ATTENDING ADDENDUM - Aldine Contes, MD: I personally saw and evaluated Ms.Oliger in this clinic visit in conjunction with the resident, Dr. Arcelia Jew. I have discussed patient's plan of care with medical resident during this visit. I have confirmed the physical exam findings and have read and agree with the clinic note including the plan with the following addition: - Will resume lexapro for depression and refer to psych - Pt to f/u with ob/gyn for brown vaginal dicharge - Smoking cessation encouraged. - Pt prescribed nicotine patch

## 2013-12-19 ENCOUNTER — Encounter: Payer: Self-pay | Admitting: Obstetrics & Gynecology

## 2014-01-05 ENCOUNTER — Telehealth: Payer: Self-pay | Admitting: *Deleted

## 2014-01-05 NOTE — Telephone Encounter (Signed)
Pt calls stating she was told at the pharm that her insurance would not pay for a new medicine that her psychiatrist prescribed without a PA, triage called the pharm and it is seroquel prescribed by a dr gray, the pharmacist states it was sent to dr gray's office. Pt then ask if she can take something else and is told she will need to speak w/ dr gray or her staff about that

## 2014-02-07 ENCOUNTER — Encounter: Payer: Medicaid Other | Admitting: Obstetrics & Gynecology

## 2014-02-13 NOTE — Addendum Note (Signed)
Addended by: Hulan Fray on: 02/13/2014 07:11 PM   Modules accepted: Orders

## 2014-07-18 ENCOUNTER — Other Ambulatory Visit: Payer: Self-pay | Admitting: Internal Medicine

## 2014-07-18 DIAGNOSIS — Z1231 Encounter for screening mammogram for malignant neoplasm of breast: Secondary | ICD-10-CM

## 2014-07-27 ENCOUNTER — Ambulatory Visit (HOSPITAL_COMMUNITY)
Admission: RE | Admit: 2014-07-27 | Discharge: 2014-07-27 | Disposition: A | Discharge status: Home / Self Care | Payer: Medicaid Other | Source: Ambulatory Visit | Attending: Internal Medicine | Admitting: Internal Medicine

## 2014-07-27 DIAGNOSIS — Z1231 Encounter for screening mammogram for malignant neoplasm of breast: Secondary | ICD-10-CM | POA: Diagnosis not present

## 2015-05-29 ENCOUNTER — Encounter: Payer: Self-pay | Admitting: Internal Medicine

## 2015-05-30 ENCOUNTER — Encounter: Payer: Self-pay | Admitting: Internal Medicine

## 2015-05-30 ENCOUNTER — Ambulatory Visit (INDEPENDENT_AMBULATORY_CARE_PROVIDER_SITE_OTHER): Payer: Medicaid Other | Admitting: Internal Medicine

## 2015-05-30 VITALS — BP 98/62 | HR 64 | Temp 98.4°F | Ht 65.0 in | Wt 122.5 lb

## 2015-05-30 DIAGNOSIS — F329 Major depressive disorder, single episode, unspecified: Secondary | ICD-10-CM | POA: Diagnosis not present

## 2015-05-30 DIAGNOSIS — F32A Depression, unspecified: Secondary | ICD-10-CM

## 2015-05-30 DIAGNOSIS — F17201 Nicotine dependence, unspecified, in remission: Secondary | ICD-10-CM | POA: Diagnosis not present

## 2015-05-30 DIAGNOSIS — Z87891 Personal history of nicotine dependence: Secondary | ICD-10-CM

## 2015-05-30 DIAGNOSIS — M25531 Pain in right wrist: Secondary | ICD-10-CM

## 2015-05-30 DIAGNOSIS — K219 Gastro-esophageal reflux disease without esophagitis: Secondary | ICD-10-CM

## 2015-05-30 DIAGNOSIS — Z0000 Encounter for general adult medical examination without abnormal findings: Secondary | ICD-10-CM

## 2015-05-30 MED ORDER — PANTOPRAZOLE SODIUM 40 MG PO TBEC: 40.0000 mg | DELAYED_RELEASE_TABLET | Freq: Every day | ORAL | Status: DC

## 2015-05-30 MED ORDER — NAPROXEN 500 MG PO TABS: 500.0000 mg | ORAL_TABLET | Freq: Two times a day (BID) | ORAL | Status: DC

## 2015-05-30 NOTE — Assessment & Plan Note (Signed)
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 No swelling or erythema on exam and no other joints involved to suggest RA. Recommended for her to start Naproxen 500 mg BID and to continue Tylenol 500 mg PRN. Will have her follow up in 6 weeks to reassess her pain.

## 2015-05-30 NOTE — Assessment & Plan Note (Signed)
Restart Protonix 40 mg daily. She was requiring Protonix 40 mg BID before. If she continues to have acid reflux symptoms despite Protonix therapy would check for H pylori infection.

## 2015-05-30 NOTE — Patient Instructions (Signed)
General Instructions: - Please make an appointment with Dr. Pearline Cables to follow up with your depression - Also contact [facility] about counseling - Start taking Protonix 40 mg daily for your acid reflux - Start taking Naproxen 500 mg twice daily for your wrist pain. You can continue Tylenol 500 mg three times daily as you have been doing.   Please bring your medicines with you each time you come to clinic.  Medicines may include prescription medications, over-the-counter medications, herbal remedies, eye drops, vitamins, or other pills.   Progress Toward Treatment Goals:  Treatment Goal 06/21/2013  Stop smoking smoking the same amount    Self Care Goals & Plans:  Self Care Goal 04/21/2012  Manage my medications take my medicines as prescribed; bring my medications to every visit; refill my medications on time  Eat healthy foods drink diet soda or water instead of juice or soda; eat more vegetables; eat foods that are low in salt  Stop smoking go to the [employer] (https://scott-booker.info/)    No flowsheet data found.   Care Management & Community Referrals:  Referral 04/21/2012  Referrals made for care management support smoking cessation counselor; social worker  Referrals made to community resources smoking cessation

## 2015-05-30 NOTE — Assessment & Plan Note (Signed)
I congratulated her on quitting smoking. We discussed the strategies for maintaining smoking cessation.

## 2015-05-30 NOTE — Progress Notes (Signed)
   Subjective:    Patient ID: Stephanie Padilla, female    DOB: [birth year], 50 y.o.   MRN: [id number]  HPI Stephanie Padilla is a 50yo woman with PMHx of tobacco abuse, depression, and GERD who presents today for follow up of Stephanie depression.  Depression: She reports she did see psychiatry after our last visit (Dr. Pearline Cables) and was started on an antidepressant, but she cannot recall the name. She states she received a 3 month supply of an antidepressant a few months ago but did not follow up with Dr. Pearline Cables. She is currently not on any antidepressant medication. She notes she was seeing a psychologist at [facility], but felt she was not connecting with Stephanie therapist. She reports she has not seen Stephanie psychologist in almost a year. She would like to go back for counseling as she found it helpful, but is interested in seeing a different provider. She reports feeling depressed most of the time, difficulty sleeping, decreased appetite, weight loss, decreased concentration, and withdrawing herself. She notes this is affecting Stephanie Padilla with Stephanie Padilla. Stephanie PHQ-9 today is 13, down from 24 at Stephanie last visit.   GERD: Reports burning epigastric pain and indigestion symptoms. She is not taking Stephanie Protonix any more.   Tobacco Abuse: Previously was smoking 1/2 ppd at last visit. Reports she quit on Dec 31st of last year.   Right Wrist Pain: Reports Stephanie pain started about 1 month ago. She notes pain with movement of Stephanie wrist in any direction. She reports sometimes she feels Stephanie wrist will "give out" and became weak where she cannot pick up objects. 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 She denies pain in any other joints. She notes a tingling sensation in all of Stephanie fingers occasionally. She has been taking Tylenol 500 mg TID which gives Stephanie minimal relief. She states Stephanie wrist will swell sometimes.    Review of Systems General: Denies fever, chills, night  sweats HEENT: Denies headaches, ear pain, changes in vision, rhinorrhea, sore throat CV: Denies CP, palpitations, SOB, orthopnea Pulm: Denies SOB, cough, wheezing GI: Denies nausea, vomiting, diarrhea, constipation, melena, hematochezia GU: Denies dysuria, hematuria, frequency Msk: Denies muscle cramps Neuro: See HPI Skin: Denies rashes, bruising Psych: Denies anxiety, hallucinations    Objective:   Physical Exam General: alert, sitting up, pleasant, NAD HEENT: [place]/AT, EOMI, sclera anicteric, mucus membranes moist CV: RRR, no m/g/r Pulm: CTA bilaterally, breaths non-labored Abd: BS+, soft, non-tender Ext: No erythema, swelling, or tenderness to palpation of right wrist, but patient guarding Stephanie wrist when trying to assess full ROM. No stiffness. No peripheral edema. Neuro: alert and oriented x 3, strength 5/5 in upper and lower extremities bilaterally    Assessment & Plan:  Please refer to A&P documentation.

## 2015-05-30 NOTE — Assessment & Plan Note (Signed)
She is due for a pap smear- will perform next visit. She will need a colonoscopy in 2019.

## 2015-05-30 NOTE — Assessment & Plan Note (Addendum)
Encouraged patient to make an appointment with Dr. Pearline Cables soon for follow up. With her PHQ-9 still at 13, I think she will likely require antidepressant therapy. I also encouraged her to contact [facility] to see if she can get in for counseling and request a different provider since she did not connect with her last one. Will continue to monitor her symptoms.

## 2015-05-30 NOTE — Progress Notes (Signed)
Case discussed with Dr. Rivet at the time of the visit.  We reviewed the resident's history and exam and pertinent patient test results.  I agree with the assessment, diagnosis and plan of care documented in the resident's note. 

## 2015-07-08 ENCOUNTER — Encounter: Payer: Self-pay | Admitting: Internal Medicine

## 2015-07-08 ENCOUNTER — Ambulatory Visit (INDEPENDENT_AMBULATORY_CARE_PROVIDER_SITE_OTHER): Payer: Medicaid Other | Admitting: Internal Medicine

## 2015-07-08 VITALS — BP 111/71 | HR 66 | Temp 98.0°F | Ht 64.0 in | Wt 114.6 lb

## 2015-07-08 DIAGNOSIS — K219 Gastro-esophageal reflux disease without esophagitis: Secondary | ICD-10-CM | POA: Diagnosis not present

## 2015-07-08 DIAGNOSIS — F32A Depression, unspecified: Secondary | ICD-10-CM

## 2015-07-08 DIAGNOSIS — F329 Major depressive disorder, single episode, unspecified: Secondary | ICD-10-CM | POA: Diagnosis not present

## 2015-07-08 DIAGNOSIS — M25531 Pain in right wrist: Secondary | ICD-10-CM

## 2015-07-08 MED ORDER — QUETIAPINE FUMARATE 50 MG PO TABS: 50.0000 mg | ORAL_TABLET | Freq: Every day | ORAL | Status: DC

## 2015-07-08 NOTE — Patient Instructions (Signed)
General Instructions: - Start Quetiapine 50 mg daily at bedtime - I have placed a referral to Dr. Earl Lites office. Their office should call you to schedule an appointment. - Follow up in 1 month to see how you are doing. We can do pap smear at that visit. - Please feel free to call the clinic if you need help, we are here for you  Thank you for bringing your medicines today. This helps [place] keep you safe from mistakes.   Progress Toward Treatment Goals:  Treatment Goal 06/21/2013  Stop smoking smoking the same amount    Self Care Goals & Plans:  Self Care Goal 04/21/2012  Manage my medications take my medicines as prescribed; bring my medications to every visit; refill my medications on time  Eat healthy foods drink diet soda or water instead of juice or soda; eat more vegetables; eat foods that are low in salt  Stop smoking go to the [employer] (https://[facility].info/)    No flowsheet data found.   Care Management & Community Referrals:  Referral 04/21/2012  Referrals made for care management support smoking cessation counselor; social worker  Referrals made to community resources smoking cessation

## 2015-07-08 NOTE — Progress Notes (Signed)
   Subjective:    Patient ID: Stephanie Padilla, female    DOB: 1965/11/25, 50 y.o.   MRN: [id number]  HPI Ms. Alegria is a 50yo woman with PMHx of GERD and depression who presents today for follow up of her depression.  Depression: Her PHQ-9 has worsened from 13 to 19 today. Additionally, she reports she has not been eating well nor sleeping well. She reports weight loss and that her hair is falling out. She denies purging her food. She reports she can only take a few bites of food and then feels full. She has not contact Dr. Earl Lites office to schedule an appointment because she was embarrassed about seeing Dr. Pearline Cables and seeming like a "failure". She is embarrassed about all the weight she has lost and her hair loss that she has been avoiding going out of her house and sits in the back at church. She reports feeling alone and isolated. Her two children live here in [place], but she does not want to burden them or have them worry about her. She confides in her pastor at church, but does not give him specific details. She denies wanting to harm herself or others.   GERD: Reports she has been taking Protonix 40 mg BID which has alleviated her acid reflux symptoms.   Right Wrist Pain: Reports she took Naproxen for a short time which alleviated her pain. Her wrist has not bothered her for quite some time. She avoids any movements or lifting heavy objects that will cause the pain to flare up.    Review of Systems General: Denies fever, chills, night sweats HEENT: Denies headaches, ear pain, changes in vision, rhinorrhea, sore throat CV: Denies CP, palpitations, SOB, orthopnea Pulm: Denies SOB, cough, wheezing GI: Denies abdominal pain, nausea, vomiting, diarrhea, constipation, melena, hematochezia GU: Denies dysuria, hematuria, frequency Msk: Denies muscle cramps Neuro: Denies weakness, numbness, tingling Skin: Denies rashes, bruising Psych: Denies hallucinations    Objective:   Physical  Exam General: thin, middle-aged woman sitting up, tearful through much of the exam HEENT: [place]/AT, EOMI, sclera anicteric, mucus membranes moist CV: RRR, no m/g/r Pulm: CTA bilaterally, breaths non-labored Abd: BS+, soft, non-tender Ext: warm, no edema  Neuro: alert and oriented x 3  Skin: she has some hair thinning around her temples and forehead Psych: depressed mood, normal speech and affect    Assessment & Plan:  Please refer to A&P documentation.

## 2015-07-09 ENCOUNTER — Telehealth: Payer: Self-pay | Admitting: Licensed Clinical Social Worker

## 2015-07-09 NOTE — Assessment & Plan Note (Signed)
Her depression symptoms are concerning, especially with her weight loss (122 lbs last visit to 114 lbs today) and feelings of isolation. She is not a harm to herself at this time. She reports doing well on the psych medication she was last on. Per review of records, she was Quetiapine 25 mg BID. Will start her on Quetiapine 50 mg QHS. I advised her to start taking this tonight. I will also place a referral to Psychiatry to get her back in with Dr. Pearline Cables as soon as possible. She reports benefiting from psychotherapy as well so hopefully she can get these services through Dr. Earl Lites office. I advised her to call the clinic if she needed to come back in sooner to talk or to go to the emergency room if she wanted to harm herself. She understands and agrees. Will have her follow up in 1 month to see how she is doing.

## 2015-07-09 NOTE — Telephone Encounter (Signed)
Stephanie Padilla was referred to CSW for assistance with pt re-establishing with behavioral health provider.  Pt was linked with Alice Rieger, FNP at [facility]  CSW placed called to pt.  CSW left message requesting return call. CSW provided contact hours and phone number.

## 2015-07-09 NOTE — Assessment & Plan Note (Signed)
Pain improved. Advised her to take Naproxen again if the pain flares up and to take with food.

## 2015-07-09 NOTE — Assessment & Plan Note (Signed)
Continue Protonix 40 mg BID. Likely her GERD is exacerbated because she has not been eating well with her depression. Hopefully her acid reflux will improve once her depression is treated.

## 2015-07-09 NOTE — Progress Notes (Signed)
Internal Medicine Clinic Attending  Case discussed with Dr. Rivet at the time of the visit.  We reviewed the resident's history and exam and pertinent patient test results.  I agree with the assessment, diagnosis, and plan of care documented in the resident's note.  

## 2015-07-10 NOTE — Telephone Encounter (Signed)
Call to Ms. [place].  Pt confirmed she was linked with [facility] and would like to re-establish with R. [facility] only.  Pt provided phone number to [facility].  CSW placed call to [facility] and left message with intake coordinator.

## 2015-07-11 NOTE — Telephone Encounter (Signed)
CSW confirmed with [facility], pt is able to contact agency and schedule directly with the med management scheduler for an appointment with Sherilyn Cooter.  Referral not needed since pt has been seen by this provider.  CSW placed call to Ms. Lorincz, pt informed of above and pt's states she has contacted agency and awaiting return call for appointment.

## 2015-09-18 ENCOUNTER — Other Ambulatory Visit: Payer: Self-pay | Admitting: Internal Medicine

## 2015-09-18 DIAGNOSIS — Z1231 Encounter for screening mammogram for malignant neoplasm of breast: Secondary | ICD-10-CM

## 2015-09-27 ENCOUNTER — Ambulatory Visit
Admission: RE | Admit: 2015-09-27 | Discharge: 2015-09-27 | Disposition: A | Discharge status: Home / Self Care | Payer: Medicaid Other | Source: Ambulatory Visit | Attending: Family Medicine | Admitting: Family Medicine

## 2015-09-27 DIAGNOSIS — Z1231 Encounter for screening mammogram for malignant neoplasm of breast: Secondary | ICD-10-CM

## 2015-12-03 ENCOUNTER — Encounter: Payer: Self-pay | Admitting: Internal Medicine

## 2015-12-03 ENCOUNTER — Ambulatory Visit (INDEPENDENT_AMBULATORY_CARE_PROVIDER_SITE_OTHER): Payer: Medicaid Other | Admitting: Internal Medicine

## 2015-12-03 ENCOUNTER — Other Ambulatory Visit (HOSPITAL_COMMUNITY)
Admission: RE | Admit: 2015-12-03 | Discharge: 2015-12-03 | Disposition: A | Discharge status: Home / Self Care | Payer: Medicaid Other | Source: Ambulatory Visit | Attending: Internal Medicine | Admitting: Internal Medicine

## 2015-12-03 VITALS — BP 127/78 | HR 60 | Temp 98.0°F | Wt 120.1 lb

## 2015-12-03 DIAGNOSIS — F330 Major depressive disorder, recurrent, mild: Secondary | ICD-10-CM

## 2015-12-03 DIAGNOSIS — F329 Major depressive disorder, single episode, unspecified: Secondary | ICD-10-CM

## 2015-12-03 DIAGNOSIS — Z01419 Encounter for gynecological examination (general) (routine) without abnormal findings: Secondary | ICD-10-CM | POA: Diagnosis present

## 2015-12-03 DIAGNOSIS — Z87891 Personal history of nicotine dependence: Secondary | ICD-10-CM | POA: Diagnosis not present

## 2015-12-03 DIAGNOSIS — Z1151 Encounter for screening for human papillomavirus (HPV): Secondary | ICD-10-CM | POA: Insufficient documentation

## 2015-12-03 DIAGNOSIS — K219 Gastro-esophageal reflux disease without esophagitis: Secondary | ICD-10-CM | POA: Diagnosis not present

## 2015-12-03 DIAGNOSIS — Z0000 Encounter for general adult medical examination without abnormal findings: Secondary | ICD-10-CM

## 2015-12-03 MED ORDER — QUETIAPINE FUMARATE 50 MG PO TABS: 50.0000 mg | ORAL_TABLET | Freq: Every day | ORAL | 2 refills | Status: DC

## 2015-12-03 MED ORDER — PANTOPRAZOLE SODIUM 40 MG PO TBEC: 40.0000 mg | DELAYED_RELEASE_TABLET | Freq: Every day | ORAL | 2 refills | Status: DC

## 2015-12-03 NOTE — Assessment & Plan Note (Signed)
Her depression remains uncontrolled with PHQ9 score of 18 today. She ran out of her Quetiapine and did not follow up with psychiatry. I discussed with her the importance of getting her depression treated as it is likely causing her to be malnourished. Will restart Quetiapine 50 mg QHS and have her follow up in 1 month to reassess her symptoms. She will make an appointment with Dr. Pearline Cables and also see psychology as well.

## 2015-12-03 NOTE — Patient Instructions (Signed)
General Instructions: - Restart Quetiapine 50 mg at bedtime - Please make an appointment with Dr. Pearline Cables to discuss your depression medications - Try to eat 3 meals a day - Refilled Protonix 40 mg daily for your acid reflux - Follow up in 1 month to reassess how your depression is doing   Please bring your medicines with you each time you come to clinic.  Medicines may include prescription medications, over-the-counter medications, herbal remedies, eye drops, vitamins, or other pills.   Progress Toward Treatment Goals:  Treatment Goal 06/21/2013  Stop smoking smoking the same amount    Self Care Goals & Plans:  Self Care Goal 04/21/2012  Manage my medications take my medicines as prescribed; bring my medications to every visit; refill my medications on time  Eat healthy foods drink diet soda or water instead of juice or soda; eat more vegetables; eat foods that are low in salt  Stop smoking go to the [employer] (https://sc[facility].info/)    No flowsheet data found.   Care Management & Community Referrals:  Referral 04/21/2012  Referrals made for care management support smoking cessation counselor; social worker  Referrals made to community resources smoking cessation

## 2015-12-03 NOTE — Assessment & Plan Note (Addendum)
Refilled her Protonix. Will have to determine if she is still having symptoms while on PPI given her concomitant weight loss. Her weight loss is likely related to her uncontrolled depression but I think with this warning symptom she would warrant EGD if her reflux symptoms are not controlled on PPI therapy.

## 2015-12-03 NOTE — Assessment & Plan Note (Signed)
Pap smear performed.  Refused flu shot today

## 2015-12-03 NOTE — Progress Notes (Signed)
   CC: Depression  HPI:  [name] is a 50 y.o. woman with PMHx as noted below who presents today for follow up of her depression.  Depression: PHQ-9 score is 18 today. She admits she did not follow up with psychiatry because she was embarrassed. She did take the Quetiapine that was prescribed at her last visit but she ran out a few months ago. She only received a 3 month prescription. She reports poor appetite and poor sleep. She has noticed her hair thinning as well. She denies SI.   GERD: Reports she has been taking OTC acid reflux medication because she ran out of her Protonix. She cannot recall the name of the medication. Denies abdominal pain, nausea, vomiting, melena, or hematochezia.   Pap smear: Patient due for a pap smear. Denies any vaginal discharge, pain. Reports she is sexually active with 1 partner. She does not want testing for STIs.   Past Medical History:  Diagnosis Date  . ALLERGIC RHINITIS 10/10/2009   Qualifier: Diagnosis of  By: Guy Sandifer DO, Darrick Penna    . CANNABIS ABUSE 04/17/2008   Qualifier: Diagnosis of  By: Redmond Pulling  MD, Mateo Flow    . Chronic abdominal pain    Chronic Abd distension / bloating. W/U includes CT ABD & pelvis 7/08 negative. EGD 5/09 Dr Penelope Coop :erythematous gastric mucosa and bx c/w chronic gastritis - was neg for H pylori. Duo polyp had path c/w peptic duodenitis  . Depression   . DEPRESSION 08/05/2006   Qualifier: Diagnosis of  By: Redmond Pulling  MD, Mateo Flow    . GERD 08/05/2006   Qualifier: Diagnosis of  By: Redmond Pulling  MD, Mateo Flow    . Goiter, unspecified 10/16/2009   Qualifier: Diagnosis of  By: Guy Sandifer DO, Darrick Penna    . Hemorrhoids 01/18/2012  . INSOMNIA 10/15/2008   Qualifier: Diagnosis of  By: Redmond Pulling  MD, Mateo Flow    . Irritable bowel syndrome 08/06/2009   Qualifier: Diagnosis of  By: Criss Alvine    . NICOTINE ADDICTION 07/06/2008   Started on Chantix.    . 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 Spontaneous  abortion    2    Review of Systems:  All negative except per HPI  Physical Exam:  Vitals:   12/03/15 1604  BP: 127/78  Pulse: 60  Temp: 98 F (36.7 C)  TempSrc: Oral  SpO2: 100%  Weight: 120 lb 1.6 oz (54.5 kg)   General: thin woman sitting up, NAD HEENT: [place]/AT, EOMI, sclera anicteric, mucus membranes moist CV: RRR, no m/g/r Pulm: CTA bilaterally, breaths non-labored  Abd: BS+, soft, non-tender GU: External genitalia appear normal. Cervix and vaginal canal appear normal with no lesions. Thin white vaginal discharge noted.  Ext: warm, no peripheral edema Neuro: alert and oriented x 3 Psych: depressed mood. Normal speech and thought content.   Assessment & Plan:   See Encounters Tab for problem based charting.  Patient discussed with Dr. Eppie Gibson

## 2015-12-05 LAB — CYTOLOGY - PAP
DIAGNOSIS: NEGATIVE
HPV (WINDOPATH): NOT DETECTED

## 2015-12-05 NOTE — Progress Notes (Signed)
Case discussed with Dr. Rivet at the time of the visit.  We reviewed the resident's history and exam and pertinent patient test results.  I agree with the assessment, diagnosis, and plan of care documented in the resident's note. 

## 2015-12-18 ENCOUNTER — Telehealth: Payer: Self-pay

## 2015-12-18 NOTE — Telephone Encounter (Signed)
Pt has refills at pharm, confirmed w/ pharm but needs PA, have ask pharm twice to fax form, informed pt

## 2015-12-18 NOTE — Telephone Encounter (Signed)
QUEtiapine (SEROQUEL) 50 MG tablet, refill request @ [facility] on [address].

## 2015-12-19 ENCOUNTER — Telehealth: Payer: Self-pay | Admitting: *Deleted

## 2015-12-19 NOTE — Telephone Encounter (Signed)
Call to [facility] at [phone] to check on need for PA for med that is on preferred list.  Spoke with pharmacist who ran medication through [place] [facility].  Med was approved .  Sander Nephew, RN 12/19/2015 11:30 AM

## 2016-05-08 ENCOUNTER — Ambulatory Visit: Payer: Medicaid Other

## 2016-06-30 ENCOUNTER — Encounter: Payer: Self-pay | Admitting: *Deleted

## 2016-08-11 ENCOUNTER — Ambulatory Visit: Payer: Medicaid Other

## 2016-08-19 ENCOUNTER — Ambulatory Visit: Payer: Medicaid Other

## 2016-08-20 ENCOUNTER — Ambulatory Visit: Payer: Medicaid Other

## 2016-08-25 ENCOUNTER — Encounter: Payer: Self-pay | Admitting: Internal Medicine

## 2016-08-25 ENCOUNTER — Ambulatory Visit (INDEPENDENT_AMBULATORY_CARE_PROVIDER_SITE_OTHER): Payer: Self-pay | Admitting: Internal Medicine

## 2016-08-25 VITALS — BP 120/81 | HR 66 | Temp 98.7°F | Ht 64.0 in | Wt 112.9 lb

## 2016-08-25 DIAGNOSIS — F339 Major depressive disorder, recurrent, unspecified: Secondary | ICD-10-CM

## 2016-08-25 DIAGNOSIS — E41 Nutritional marasmus: Secondary | ICD-10-CM

## 2016-08-25 DIAGNOSIS — R102 Pelvic and perineal pain: Secondary | ICD-10-CM

## 2016-08-25 DIAGNOSIS — F330 Major depressive disorder, recurrent, mild: Secondary | ICD-10-CM

## 2016-08-25 DIAGNOSIS — K219 Gastro-esophageal reflux disease without esophagitis: Secondary | ICD-10-CM

## 2016-08-25 DIAGNOSIS — T50996A Underdosing of other drugs, medicaments and biological substances, initial encounter: Secondary | ICD-10-CM

## 2016-08-25 DIAGNOSIS — Z91120 Patient's intentional underdosing of medication regimen due to financial hardship: Secondary | ICD-10-CM

## 2016-08-25 DIAGNOSIS — F509 Eating disorder, unspecified: Secondary | ICD-10-CM

## 2016-08-25 MED ORDER — PANTOPRAZOLE SODIUM 40 MG PO TBEC: 40.0000 mg | DELAYED_RELEASE_TABLET | Freq: Every day | ORAL | 0 refills | Status: DC

## 2016-08-25 MED ORDER — MIRTAZAPINE 15 MG PO TABS: 15.0000 mg | ORAL_TABLET | Freq: Every day | ORAL | 0 refills | Status: DC

## 2016-08-25 MED FILL — MIRTAZAPINE 15 MG TABLET: 15 | 30 days supply | Qty: 60 | Fill #0

## 2016-08-25 MED FILL — PANTOPRAZOLE SOD DR 40 MG T: 40 | 30 days supply | Qty: 30 | Fill #0

## 2016-08-25 NOTE — Assessment & Plan Note (Signed)
Although there was no obvious cause of her current complaints regarding pelvic pain. Her pelvic exam was benign. Her symptoms of persistent pelvic pain, bloatedness, worsening fatigue and weight loss needs to rule out any ovarian pathology.  She was given referral for transvaginal pelvic ultrasound, if her current insurance will not pay for that exam, she was advised to schedule in early September. -She was also provided with some Tylenol to use symptomatically.

## 2016-08-25 NOTE — Patient Instructions (Signed)
Thank you for visiting clinic today. I'm giving you a prescription of mirtazapine/Remeron, please start with one tablet of 15 mg at bedtime, if you are able to tolerate it, increase to 2 tablets at bedtime after 1 week. I'm also giving you a prescription of Protonix, take it daily in the morning before eating anything. If you continue to experience your stomach pain you can also take 1 tablet in the evening. I am giving you a referral to see a gastroenterologist for your stomach problem. I'm also putting in order for ultrasound for your ovaries. Please confirm before going for your ultrasound that your current insurance pays for it. If it does not pay, you can schedule it for September when she will be covered. Please follow-up in clinic in 1 month.

## 2016-08-25 NOTE — Assessment & Plan Note (Signed)
She continued to Some GERD symptoms. Her complaints are more like epigastric pain, aggravated with food. She was also complaining of early satiety due to pain. She is losing weight because of her inability to eat due to pain combined with her eating disorder. She has a decreased in her weight of about 8 pound since November 2017.  A previous EGD done in 2009 shows duodenitis. It was negative for any malignancy and H pylori at that time.  Her worsening depression and increased in stresses might be playing a role in her symptoms.  She needs a gastroenterology evaluation with EGD to rule out any malignancy or peptic ulcer disease.  -Referral was provided for gastroenterology, she will schedule an appointment in September when her [place] letter will become effective. -She was provided with a prescription for Protonix through [facility] program at [facility] outpatient pharmacy.

## 2016-08-25 NOTE — Progress Notes (Signed)
CC: Continuous aching and throbbing vaginal pain for months.  HPI:  Stephanie Padilla is a 51 y.o. with past medical history significant for eating disorder, depression and GERD presented to the clinic today with complaint of continuous aching and throbbing vaginal pain for months. According to patient she had this problem more than a year ago and [facility], where they told her that she is having some nerve issues and gave her a numbing foam to apply locally, it did not help with her pain but did cause some irritation. Her pain spontaneously resolved for a few months and then restarted a few months ago. She denied any vaginal lesion, itchiness or irritation, no vaginal discharge. According to patient she is not sexually active active for more than a year now. She denies any urinary frequency, urgency or dysuria. She do complain of intermittent constipation alternating with diarrhea. She is having some constipation currently.  Patient has long-standing history of depression and eating disorder. She was given multiple SSRI and Seroquel in the past. She is not taking any medication currently, stating that she is unable to afford it. She is currently having family planning medicaid, has been approved for [facility] card along with [employer] letter, starting from 09/19/2016, at that time she will be able to get coverage for all services. She was complaining of early satiety and epigastric pain aggravated with eating. She occasionally become nauseated but denies any vomitus. She had an EGD done in 2009 which shows duodenitis. She does has colonoscopy in 2014, with the removal of some polyp and recommendation for repeat in 5 years. She do complaint of bloating and generalized feeling of fatigue. She do complaint of insomnia. She was very tearful, stating that she is going through a lot of stress is currently. She denies any suicidal ideations.   Past Medical History:  Diagnosis Date  .  ALLERGIC RHINITIS 10/10/2009   Qualifier: Diagnosis of  By: Guy Sandifer DO, Darrick Penna    . CANNABIS ABUSE 04/17/2008   Qualifier: Diagnosis of  By: Redmond Pulling  MD, Mateo Flow    . Chronic abdominal pain    Chronic Abd distension / bloating. W/U includes CT ABD & pelvis 7/08 negative. EGD 5/09 Dr Penelope Coop :erythematous gastric mucosa and bx c/w chronic gastritis - was neg for H pylori. Duo polyp had path c/w peptic duodenitis  . Depression   . DEPRESSION 08/05/2006   Qualifier: Diagnosis of  By: Redmond Pulling  MD, Mateo Flow    . GERD 08/05/2006   Qualifier: Diagnosis of  By: Redmond Pulling  MD, Mateo Flow    . Goiter, unspecified 10/16/2009   Qualifier: Diagnosis of  By: Guy Sandifer DO, Darrick Penna    . Hemorrhoids 01/18/2012  . INSOMNIA 10/15/2008   Qualifier: Diagnosis of  By: Redmond Pulling  MD, Mateo Flow    . Irritable bowel syndrome 08/06/2009   Qualifier: Diagnosis of  By: Criss Alvine    . NICOTINE ADDICTION 07/06/2008   Started on Chantix.    . 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 Spontaneous abortion    2   Review of Systems:  AS per HPI.  Physical Exam:  Vitals:   08/25/16 0848 08/25/16 1040  BP: 139/80 120/81  Pulse: 61 66  Temp: 98.7 F (37.1 C)   TempSrc: Oral   SpO2: 100%   Weight: 112 lb 14.4 oz (51.2 kg)   Height: 5\' 4"  (1.626 m)     General: Vital signs reviewed.  Patient is well-developed and  emaciated, in no acute distress and cooperative with exam.  Cardiovascular: RRR, S1 normal, S2 normal, no murmurs, gallops, or rubs. Pulmonary/Chest: Clear to auscultation bilaterally, no wheezes, rales, or rhonchi. Abdominal: Soft, mild epigastric tenderness, non-distended, BS +. GU. External genitalia appears normal, cervix and vaginal canal appears normal, no obvious lesion. No discharge noted. Extremities: No lower extremity edema bilaterally,  pulses symmetric and intact bilaterally. No cyanosis or clubbing. Skin: Warm, dry and intact. No rashes or erythema. Psychiatric: Patient  appears depressed and tearful.  Assessment & Plan:   See Encounters Tab for problem based charting.  Patient discussed with Dr. Lynnae January.

## 2016-08-25 NOTE — Assessment & Plan Note (Addendum)
Her pH Q9 score today was 25. She has a long-standing history of depression and eating disorder. Her depression and eating disorder most likely is a big contributor to her weight loss and current symptoms. She used to see Dr. Pearline Cables, has been tried SSRI and Seroquel in the past. She was not taking any medications currently because of affordability. According to previous office visit in November 2017 she was given Seroquel, according to patient she does not like the side effects of Seroquel and don't want to take it.  -She was given information about [facility]. -She was given a prescription of mirtazapine 15 mg at bedtime, Asked to increased to 30 mg after one week if she is able to tolerate current dose. She was given Remeron in the past and according to chart review she was able to tolerated well. Remeron/mirtazapine should be able to increase her appetite, help with insomnia and depression.

## 2016-08-31 NOTE — Progress Notes (Signed)
Internal Medicine Clinic Attending  Case discussed with Dr. Amin at the time of the visit.  We reviewed the resident's history and exam and pertinent patient test results.  I agree with the assessment, diagnosis, and plan of care documented in the resident's note.    

## 2016-09-23 ENCOUNTER — Other Ambulatory Visit: Payer: Self-pay | Admitting: Internal Medicine

## 2016-09-23 MED ORDER — PANTOPRAZOLE SODIUM 40 MG PO TBEC
40.0000 mg | DELAYED_RELEASE_TABLET | Freq: Every day | ORAL | 0 refills | Status: DC
Start: 2016-09-23 — End: 2016-09-24

## 2016-09-23 NOTE — Telephone Encounter (Signed)
NEEDS REFILL ON ACID REFLUX MEDICATION 40MG , SUIRQUEL, [place]

## 2016-09-24 ENCOUNTER — Other Ambulatory Visit: Payer: Self-pay | Admitting: *Deleted

## 2016-09-24 DIAGNOSIS — F330 Major depressive disorder, recurrent, mild: Secondary | ICD-10-CM

## 2016-09-24 MED ORDER — PANTOPRAZOLE SODIUM 40 MG PO TBEC: 40.0000 mg | DELAYED_RELEASE_TABLET | Freq: Every day | ORAL | 0 refills | Status: DC

## 2016-09-24 MED ORDER — MIRTAZAPINE 15 MG PO TABS: 15.0000 mg | ORAL_TABLET | Freq: Every day | ORAL | 0 refills | Status: DC

## 2016-09-24 NOTE — Addendum Note (Signed)
Addended by: Marcelino Duster on: 09/24/2016 11:44 AM   Modules accepted: Orders

## 2016-09-24 NOTE — Telephone Encounter (Signed)
Call made to patient to let her know that her protonix refill was approved by her MD.  Pt stated that she also needs there remeron refilled.  Pt had also requested to have [facility].  [facility] was contacted to d/c pantoprazole rx and will have MD send rx to [facility].  Will send remeron request to pcp for consideration, please advise.Regenia Skeeter, Darlene Cassady9/6/201811:40 AM

## 2016-09-29 ENCOUNTER — Telehealth: Payer: Self-pay

## 2016-09-29 ENCOUNTER — Other Ambulatory Visit: Payer: Self-pay | Admitting: Internal Medicine

## 2016-09-29 DIAGNOSIS — R102 Pelvic and perineal pain: Secondary | ICD-10-CM

## 2016-09-29 NOTE — Telephone Encounter (Signed)
Pt calls and states she has concerns about her health, especially her mental health, she feels she is sliding deeper into depression and feels this effects her overall health. She then feels like she needs to have the pelvic pain addressed and then the gastroenterologist. She states she is just feeling worse and needs help as soon as possible. Anything to hasten the appts  would be appreciated.

## 2016-09-29 NOTE — Telephone Encounter (Signed)
Needs to speak with a nurse about meds. Please cal pt back.

## 2016-09-29 NOTE — Telephone Encounter (Signed)
Called pt to ask her to go to [facility] as walk-in, got vmail, lm for rtc

## 2016-09-30 NOTE — Telephone Encounter (Signed)
Thank you recommending [facility].

## 2016-10-01 NOTE — Telephone Encounter (Signed)
Pt rtc from message I had left her, gave her address of [facility], explained exactly where it is located, gave her ph#, informed her as to why the referrals took so long- orange card issues

## 2016-10-07 MED FILL — PANTOPRAZOLE SOD DR 40 MG T: 40 | 30 days supply | Qty: 30 | Fill #0

## 2016-10-08 ENCOUNTER — Ambulatory Visit (HOSPITAL_COMMUNITY): Payer: Self-pay

## 2016-10-08 ENCOUNTER — Other Ambulatory Visit: Payer: Self-pay | Admitting: Internal Medicine

## 2016-10-09 ENCOUNTER — Ambulatory Visit (HOSPITAL_COMMUNITY): Payer: Self-pay

## 2016-10-10 ENCOUNTER — Emergency Department (HOSPITAL_COMMUNITY)
Admission: EM | Admit: 2016-10-10 | Discharge: 2016-10-10 | Disposition: A | Discharge status: Home / Self Care | Payer: Self-pay | Attending: Emergency Medicine | Admitting: Emergency Medicine

## 2016-10-10 ENCOUNTER — Emergency Department (HOSPITAL_COMMUNITY): Payer: Self-pay

## 2016-10-10 ENCOUNTER — Encounter (HOSPITAL_COMMUNITY): Payer: Self-pay | Admitting: Emergency Medicine

## 2016-10-10 DIAGNOSIS — D251 Intramural leiomyoma of uterus: Secondary | ICD-10-CM | POA: Insufficient documentation

## 2016-10-10 DIAGNOSIS — Z79899 Other long term (current) drug therapy: Secondary | ICD-10-CM | POA: Insufficient documentation

## 2016-10-10 DIAGNOSIS — R102 Pelvic and perineal pain: Secondary | ICD-10-CM

## 2016-10-10 DIAGNOSIS — Z87891 Personal history of nicotine dependence: Secondary | ICD-10-CM | POA: Insufficient documentation

## 2016-10-10 LAB — WET PREP, GENITAL
Clue Cells Wet Prep HPF POC: NONE SEEN
Sperm: NONE SEEN
Trich, Wet Prep: NONE SEEN
Yeast Wet Prep HPF POC: NONE SEEN

## 2016-10-10 LAB — URINALYSIS, ROUTINE W REFLEX MICROSCOPIC
Bacteria, UA: NONE SEEN
Glucose, UA: NEGATIVE mg/dL
Hgb urine dipstick: NEGATIVE
KETONES UR: NEGATIVE mg/dL
Leukocytes, UA: NEGATIVE
Nitrite: NEGATIVE
Protein, ur: 30 mg/dL — AB
SPECIFIC GRAVITY, URINE: 1.042 — AB (ref 1.005–1.030)
pH: 5 (ref 5.0–8.0)

## 2016-10-10 NOTE — Discharge Instructions (Signed)
Alternate 600 mg of ibuprofen and [phone] mg of Tylenol every 3 hours as needed for pain. Do not exceed 4000 mg of Tylenol daily. Follow up with OB/GYN for reevaluation of your symptoms. Return to the ED immediately if any concerning signs or symptoms develop such as fevers, abnormal vaginal bleeding, or severe worsening of pain.

## 2016-10-10 NOTE — ED Notes (Signed)
Pt stable,ambulatory, and verbalizes understanding of D/C instructions.   

## 2016-10-10 NOTE — ED Triage Notes (Signed)
Pt c/o vaginal pain x 4 months, pt seen by her doctor and was scheduled to have an ultrasound yesterday, but states doc didn't put order in. Pt states the pain has gotten worse in the last few weeks, and makes it difficult to walk at times. Denies vaginal bleeding/discharge.
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
## 2016-10-10 NOTE — ED Notes (Signed)
Patient transported to Ultrasound 

## 2016-10-12 LAB — GC/CHLAMYDIA PROBE AMP (~~LOC~~) NOT AT ARMC
Chlamydia: NEGATIVE
Neisseria Gonorrhea: NEGATIVE

## 2016-10-12 LAB — URINE CULTURE

## 2016-10-13 ENCOUNTER — Ambulatory Visit (INDEPENDENT_AMBULATORY_CARE_PROVIDER_SITE_OTHER): Payer: Self-pay | Admitting: Internal Medicine

## 2016-10-13 VITALS — BP 117/74 | HR 66 | Temp 98.0°F | Ht 64.0 in | Wt 122.8 lb

## 2016-10-13 DIAGNOSIS — D259 Leiomyoma of uterus, unspecified: Secondary | ICD-10-CM | POA: Insufficient documentation

## 2016-10-13 DIAGNOSIS — F330 Major depressive disorder, recurrent, mild: Secondary | ICD-10-CM

## 2016-10-13 DIAGNOSIS — Z800 Family history of malignant neoplasm of digestive organs: Secondary | ICD-10-CM

## 2016-10-13 DIAGNOSIS — Z809 Family history of malignant neoplasm, unspecified: Secondary | ICD-10-CM

## 2016-10-13 DIAGNOSIS — D251 Intramural leiomyoma of uterus: Secondary | ICD-10-CM

## 2016-10-13 DIAGNOSIS — F339 Major depressive disorder, recurrent, unspecified: Secondary | ICD-10-CM

## 2016-10-13 DIAGNOSIS — Z5189 Encounter for other specified aftercare: Secondary | ICD-10-CM

## 2016-10-13 DIAGNOSIS — Z79899 Other long term (current) drug therapy: Secondary | ICD-10-CM

## 2016-10-13 DIAGNOSIS — Z87891 Personal history of nicotine dependence: Secondary | ICD-10-CM

## 2016-10-13 MED ORDER — TRAMADOL HCL 50 MG PO TABS: 50.0000 mg | ORAL_TABLET | Freq: Two times a day (BID) | ORAL | 0 refills | Status: AC

## 2016-10-13 MED FILL — traMADol HCL 50 MG TABS: 50 | 20 days supply | Qty: 40 | Fill #0

## 2016-10-13 NOTE — Patient Instructions (Addendum)
It was great meeting you today.  I put in a referral to see a gynecologist about your uterine fibroid.  They will be able to help you with this issue.  In the mean time I have written you a prescription for tramadol 50mg  that you can take twice a day.  I've written for a 20 day supply and you will be seen by your gynecologist before then.

## 2016-10-13 NOTE — Progress Notes (Signed)
CC: ER follow up for painful uterine fibroid and follow up on depression  HPI:  [name] is a 51 y.o. here to follow up on her pelvic pain about one year ago.  The pain has worsened since then and has felt unbearable for the last week or so enough to make her go to the Emergency department.  She was seen two days ago in the ED where a uterine fibroid 8cm was found.  She was worked up for [employer], and ovarian torsion which were negative.  Her UA and urine culture were negative for signs of infection.  She also would like to discuss her depression.   Please see A&P for status of the patient's chronic medical conditions  Past Medical History:  Diagnosis Date  . ALLERGIC RHINITIS 10/10/2009   Qualifier: Diagnosis of  By: Guy Sandifer DO, Darrick Penna    . CANNABIS ABUSE 04/17/2008   Qualifier: Diagnosis of  By: Redmond Pulling  MD, Mateo Flow    . Chronic abdominal pain    Chronic Abd distension / bloating. W/U includes CT ABD & pelvis 7/08 negative. EGD 5/09 Dr Penelope Coop :erythematous gastric mucosa and bx c/w chronic gastritis - was neg for H pylori. Duo polyp had path c/w peptic duodenitis  . Depression   . DEPRESSION 08/05/2006   Qualifier: Diagnosis of  By: Redmond Pulling  MD, Mateo Flow    . GERD 08/05/2006   Qualifier: Diagnosis of  By: Redmond Pulling  MD, Mateo Flow    . Goiter, unspecified 10/16/2009   Qualifier: Diagnosis of  By: Guy Sandifer DO, Darrick Penna    . Hemorrhoids 01/18/2012  . INSOMNIA 10/15/2008   Qualifier: Diagnosis of  By: Redmond Pulling  MD, Mateo Flow    . Irritable bowel syndrome 08/06/2009   Qualifier: Diagnosis of  By: Criss Alvine    . NICOTINE ADDICTION 07/06/2008   Started on Chantix.    . 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 Spontaneous abortion    2   Review of Systems:  ROS: Pulmonary: pt denies increased work of breathing, shortness of breath,  Cardiac: pt denies palpitations, chest pain,  Abdominal: pt denies abdominal pain, nausea, vomiting, or  diarrhea  Physical Exam:  Vitals:   10/13/16 1457  BP: 117/74  Pulse: 66  Temp: 98 F (36.7 C)  TempSrc: Oral  SpO2: 100%  Weight: 122 lb 12.8 oz (55.7 kg)  Height: 5\' 4"  (1.626 m)   Physical Exam  Constitutional: She is oriented to person, place, and time. No distress.  Cardiovascular: Normal rate, regular rhythm and normal heart sounds.  Exam reveals no gallop and no friction rub.   No murmur heard. Pulmonary/Chest: Effort normal and breath sounds normal. No respiratory distress. She has no wheezes. She has no rales. She exhibits no tenderness.  Abdominal: Soft. Bowel sounds are normal. She exhibits no distension and no mass. There is no tenderness. There is no rebound and no guarding.  Neurological: She is alert and oriented to person, place, and time.  Skin: She is not diaphoretic.    Social History   Social History  . Marital status: Single    Spouse name: N/A  . Number of children: N/A  . Years of education: N/A   Occupational History  . Not on file.   Social History Main Topics  . Smoking status: Former Smoker    Packs/day: 0.50    Types: Cigarettes    Quit date: 01/27/2013  . Smokeless tobacco: Never Used     Comment:  quit x 5 months.  . Alcohol use No  . Drug use: Yes    Frequency: 14.0 times per week     Comment: Marijuana.  . Sexual activity: Not on file   Other Topics Concern  . Not on file   Social History Narrative   Lives with son. 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 In relationship. Has medicaid. Got GED. Smoked cigs 10-15 yrs 1PPD> Restarted 3 months in 2011 but quit after 3 months.    Family History  Problem Relation Age of Onset  . Cancer Mother 6       presumed breast ca  . Cancer Paternal Grandmother        colon cancer  . Cancer - Colon Unknown        Uncle     Assessment & Plan:   See Encounters Tab for problem based charting.  Patient seen with Dr. Evette Doffing

## 2016-10-13 NOTE — Assessment & Plan Note (Addendum)
8 cm fibroid found during workup in the ED for pelvic pain.  This is almost certainly the cause of the patient's ongoing pain and discomfort.  She denies any bleeding, her last menstrual period was many years ago she felt it was regular at that time.  It is clear this has severely impacted this patient's quality of life.  -Urgent referral placed to gynecology. -prescribed 20 day supply of tramadol 50 mg twice a day in the meantime to help patient deal with the pain before her appointment.

## 2016-10-13 NOTE — Assessment & Plan Note (Addendum)
In talking further with the patient about her depression today, she attributes the trouble she is been having with it mainly to her pelvic pain.  I expect that surgical intervention will go a long way in helping her control her depressive symptoms.  For now she is being managed by [facility].  She said it really helped being able to talk about what she's been going through.    -he was given new antidepressant medications there however she did not bring them today.   -Continue current antidepressant regimen as per [facility] patient will bring her medicines next visit and we will reconcile medications at follow-up appointment in 10 days with her PCP.

## 2016-10-14 ENCOUNTER — Other Ambulatory Visit: Payer: Self-pay

## 2016-10-14 NOTE — Telephone Encounter (Signed)
Called pt - not home; call disconnected.

## 2016-10-14 NOTE — Telephone Encounter (Signed)
Needs to speak with a nurse about   traMADol (ULTRAM) 50 MG tablet.   Please call pt back.

## 2016-10-15 NOTE — Progress Notes (Signed)
Internal Medicine Clinic Attending  I saw and evaluated the patient.  I personally confirmed the key portions of the history and exam documented by Dr. Winfrey and I reviewed pertinent patient test results.  The assessment, diagnosis, and plan were formulated together and I agree with the documentation in the resident's note. 

## 2016-10-23 ENCOUNTER — Encounter: Payer: Self-pay | Admitting: Internal Medicine

## 2016-10-23 ENCOUNTER — Ambulatory Visit (INDEPENDENT_AMBULATORY_CARE_PROVIDER_SITE_OTHER): Payer: Self-pay | Admitting: Internal Medicine

## 2016-10-23 VITALS — BP 116/76 | HR 70 | Temp 98.4°F | Ht 64.0 in | Wt 123.1 lb

## 2016-10-23 DIAGNOSIS — R102 Pelvic and perineal pain: Secondary | ICD-10-CM

## 2016-10-23 DIAGNOSIS — Z87891 Personal history of nicotine dependence: Secondary | ICD-10-CM

## 2016-10-23 DIAGNOSIS — F330 Major depressive disorder, recurrent, mild: Secondary | ICD-10-CM

## 2016-10-23 DIAGNOSIS — H547 Unspecified visual loss: Secondary | ICD-10-CM | POA: Insufficient documentation

## 2016-10-23 DIAGNOSIS — E785 Hyperlipidemia, unspecified: Secondary | ICD-10-CM

## 2016-10-23 DIAGNOSIS — G8929 Other chronic pain: Secondary | ICD-10-CM

## 2016-10-23 DIAGNOSIS — K649 Unspecified hemorrhoids: Secondary | ICD-10-CM

## 2016-10-23 DIAGNOSIS — K219 Gastro-esophageal reflux disease without esophagitis: Secondary | ICD-10-CM

## 2016-10-23 DIAGNOSIS — D251 Intramural leiomyoma of uterus: Secondary | ICD-10-CM

## 2016-10-23 DIAGNOSIS — Z0000 Encounter for general adult medical examination without abnormal findings: Secondary | ICD-10-CM

## 2016-10-23 MED ORDER — PANTOPRAZOLE SODIUM 40 MG PO TBEC: 40.0000 mg | DELAYED_RELEASE_TABLET | Freq: Two times a day (BID) | ORAL | 2 refills | Status: DC

## 2016-10-23 MED ORDER — MIRTAZAPINE 30 MG PO TABS: 30.0000 mg | ORAL_TABLET | Freq: Every day | ORAL | Status: DC

## 2016-10-23 MED FILL — PANTOPRAZOLE SOD DR 40 MG T: 40 | 30 days supply | Qty: 60 | Fill #0

## 2016-10-23 NOTE — Assessment & Plan Note (Signed)
She refused flu vaccine, stating that she never got one and she do not want to have it at this time either.  We will check lipid panel as her previous lipid panel was done for year ago which shows hyperlipidemia.

## 2016-10-23 NOTE — Patient Instructions (Signed)
Thank you for visiting clinic today. You have an appointment with gynecologist on 11/12/2013 at 10:20 AM. We're checking some basic blood workup today, we will call you with any abnormal results. I'm also giving you a referral for an eye doctor. Please continue following up with [facility].

## 2016-10-23 NOTE — Assessment & Plan Note (Signed)
She continued to have pelvic pain without any relief. Her pelvic ultrasound was only positive for a uterine fibroid, that does not explain completely her pain. She appears very depressed and depression might be playing a role to exaggerate her symptoms of pain.  She had an appointment with gynecologist on 11/12/2016. She was instructed to follow-up with gynecologist according to that scheduled appointment.

## 2016-10-23 NOTE — Assessment & Plan Note (Signed)
She was complaining of intermittent bleeding from her hemorrhoids.  -Check CBC and FOBT.

## 2016-10-23 NOTE — Assessment & Plan Note (Addendum)
She follow-up with [facility]. She had an upcoming appointment next Monday. Currently she is on mirtazapine 30 mg daily and olanzapine. She wants to discuss her medications during her upcoming appointment at [facility] as she thinks that olanzapine is causing nausea and decreased appetite.  We really appreciate their help managing her depression. Continue follow-up with [facility].- -We will check TSH as a possible cause of her continuous depression.
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
## 2016-10-23 NOTE — Assessment & Plan Note (Signed)
She wants to see an ophthalmologist as she is experiencing gradually decreased vision in her one eye. 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  She was giving a referral for an ophthalmologist.

## 2016-10-23 NOTE — Assessment & Plan Note (Signed)
She recently herself increased her Protonix from daily to twice a day dose because of worsening GERD symptoms. According to patient twice daily dose is really helping her.  -She was given a new prescription with twice daily dose. She needs to be reassessed during next visit to decrease her dose. I did talk with her regarding decreasing her dose to daily in about a week's time and see if that keep controlling her symptoms. She can increase to twice daily dose if her symptoms get worse.

## 2016-10-24 LAB — CBC
Hematocrit: 39.1 % (ref 34.0–46.6)
Hemoglobin: 13.3 g/dL (ref 11.1–15.9)
MCH: 31.4 pg (ref 26.6–33.0)
MCHC: 34 g/dL (ref 31.5–35.7)
MCV: 92 fL (ref 79–97)
Platelets: 304 10*3/uL (ref 150–379)
RBC: 4.24 x10E6/uL (ref 3.77–5.28)
RDW: 13.5 % (ref 12.3–15.4)
WBC: 6.8 10*3/uL (ref 3.4–10.8)

## 2016-10-24 LAB — BMP8+ANION GAP
Anion Gap: 18 mmol/L (ref 10.0–18.0)
BUN / CREAT RATIO: 10 (ref 9–23)
BUN: 7 mg/dL (ref 6–24)
CHLORIDE: 103 mmol/L (ref 96–106)
CO2: 23 mmol/L (ref 20–29)
Calcium: 9.7 mg/dL (ref 8.7–10.2)
Creatinine, Ser: 0.68 mg/dL (ref 0.57–1.00)
GFR calc Af Amer: 119 mL/min/{1.73_m2} (ref >59)
GFR calc non Af Amer: 103 mL/min/{1.73_m2} (ref >59)
GLUCOSE: 92 mg/dL (ref 65–99)
POTASSIUM: 3.5 mmol/L (ref 3.5–5.2)
Sodium: 144 mmol/L (ref 134–144)

## 2016-10-24 LAB — LIPID PANEL
Chol/HDL Ratio: 2 ratio (ref 0.0–4.4)
Cholesterol, Total: 180 mg/dL (ref 100–199)
HDL: 90 mg/dL (ref >39)
LDL CALC: 67 mg/dL (ref 0–99)
Triglycerides: 115 mg/dL (ref 0–149)
VLDL CHOLESTEROL CAL: 23 mg/dL (ref 5–40)

## 2016-10-24 LAB — TSH: TSH: 1.78 u[IU]/mL (ref 0.450–4.500)

## 2016-10-26 NOTE — Progress Notes (Signed)
Internal Medicine Clinic Attending  Case discussed with Dr. Amin at the time of the visit.  We reviewed the resident's history and exam and pertinent patient test results.  I agree with the assessment, diagnosis, and plan of care documented in the resident's note.    

## 2016-11-12 ENCOUNTER — Ambulatory Visit (INDEPENDENT_AMBULATORY_CARE_PROVIDER_SITE_OTHER): Payer: Self-pay | Admitting: Obstetrics and Gynecology

## 2016-11-12 ENCOUNTER — Encounter: Payer: Self-pay | Admitting: Obstetrics and Gynecology

## 2016-11-12 VITALS — BP 117/77 | HR 66 | Ht 64.0 in | Wt 124.0 lb

## 2016-11-12 DIAGNOSIS — R3129 Other microscopic hematuria: Secondary | ICD-10-CM

## 2016-11-12 DIAGNOSIS — D251 Intramural leiomyoma of uterus: Secondary | ICD-10-CM

## 2016-11-12 DIAGNOSIS — F32A Depression, unspecified: Secondary | ICD-10-CM

## 2016-11-12 DIAGNOSIS — F329 Major depressive disorder, single episode, unspecified: Secondary | ICD-10-CM

## 2016-11-12 DIAGNOSIS — R3989 Other symptoms and signs involving the genitourinary system: Secondary | ICD-10-CM

## 2016-11-12 LAB — POCT URINALYSIS DIP (DEVICE)
GLUCOSE, UA: NEGATIVE mg/dL
Ketones, ur: NEGATIVE mg/dL
Leukocytes, UA: NEGATIVE
Nitrite: NEGATIVE
PH: 6 (ref 5.0–8.0)
Protein, ur: NEGATIVE mg/dL
Specific Gravity, Urine: 1.025 (ref 1.005–1.030)
UROBILINOGEN UA: 0.2 mg/dL (ref 0.0–1.0)
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
## 2016-11-12 NOTE — Progress Notes (Signed)
Pt stated having pain/pressure in the vagina for about 1-2 year. Pt seen ED 10/10/16 and prescribe Rx tramadol for pain.

## 2016-11-13 ENCOUNTER — Telehealth: Payer: Self-pay | Admitting: *Deleted

## 2016-11-13 NOTE — Telephone Encounter (Signed)
[facility] at [phone] to schedule urology consult for bladder pain.  They will call her for appointment for financial application first, then after completed will schedule appointment with provider. She may call the financial office if she wants to not wait until they call her at [phone]. I called and notified [place] of plan. She voices understanding. Records will be sent by registrars.

## 2016-11-14 LAB — URINE CULTURE

## 2016-11-23 ENCOUNTER — Telehealth: Payer: Self-pay | Admitting: General Practice

## 2016-11-23 NOTE — Telephone Encounter (Signed)
Patient called and left message stating she has a question about an upcoming test. Called patient and she states her PCP has referred her to GI and she wants to know if she needs this appt with [facility] or not or if GI can take care of everything. Told patient the [facility] referral is for Urology for her bladder pain which GI will not evaluate for. Patient verbalized understanding & states she will contact their financial assistance department. Patient had no other questions

## 2016-12-03 ENCOUNTER — Other Ambulatory Visit: Payer: Self-pay | Admitting: Gastroenterology

## 2016-12-03 DIAGNOSIS — R1013 Epigastric pain: Secondary | ICD-10-CM

## 2016-12-08 ENCOUNTER — Ambulatory Visit
Admission: RE | Admit: 2016-12-08 | Discharge: 2016-12-08 | Disposition: A | Discharge status: Home / Self Care | Payer: No Typology Code available for payment source | Source: Ambulatory Visit | Attending: Gastroenterology | Admitting: Gastroenterology

## 2016-12-08 DIAGNOSIS — R1013 Epigastric pain: Secondary | ICD-10-CM

## 2016-12-16 ENCOUNTER — Ambulatory Visit (INDEPENDENT_AMBULATORY_CARE_PROVIDER_SITE_OTHER): Payer: No Typology Code available for payment source | Admitting: Internal Medicine

## 2016-12-16 ENCOUNTER — Encounter: Payer: Self-pay | Admitting: Internal Medicine

## 2016-12-16 VITALS — BP 110/77 | HR 77 | Temp 98.1°F | Wt 131.3 lb

## 2016-12-16 DIAGNOSIS — K219 Gastro-esophageal reflux disease without esophagitis: Secondary | ICD-10-CM

## 2016-12-16 DIAGNOSIS — Q111 Other anophthalmos: Secondary | ICD-10-CM

## 2016-12-16 DIAGNOSIS — R102 Pelvic and perineal pain: Secondary | ICD-10-CM

## 2016-12-16 DIAGNOSIS — D259 Leiomyoma of uterus, unspecified: Secondary | ICD-10-CM

## 2016-12-16 MED ORDER — OMEPRAZOLE 40 MG PO CPDR: 40.0000 mg | DELAYED_RELEASE_CAPSULE | Freq: Two times a day (BID) | ORAL | 1 refills | Status: DC | PRN

## 2016-12-16 NOTE — Patient Instructions (Addendum)
FOLLOW-UP INSTRUCTIONS When: 1 month For: Pelvic pain, referral follow up, GI follow up What to bring: Medications  We may need to consider a new imaging method to look at your pelvic pain and fibroids.  Continue supportive treatments as you are with NSAIDs, soaking, exercise  For acid reflux you may try switching to omeprazole instead of pantoprazole and see if this helps.  I do not think the pain is currently coming from a bladder problem, although cystoscopy will be needed to exclude bladder cancer.

## 2016-12-16 NOTE — Progress Notes (Signed)
CC: Pelvic pain  HPI:  [name] is a 51 y.o. female with PMHx detailed below presenting with ongoing pelvic pain previously discussed at her gynecologist's office.  See problem based assessment and plan below for additional details.  Uterine leiomyoma She was recently seen at [facility] for suspected uterine fibroid pain. Previous ED visit for this documented ultrasound of a 1.8cm fibroid. At gynecology office this was not felt to be explanatory of her symptoms but incidental instead. They also observed on urinalysis microscopic hematuria concerning for uroepithelial malignancy in a former smoker. Referral has been made to urology but will probably not be seen for at least several more weeks. During the interval low-dose tramadol has been ineffective in improving her pelvic pain. Oral naproxen was not very effective so she stopped taking it. She is continuing to soak which is partially beneficial to both her pelvic pain and hemorrhoidal pain.  She was very frustrated by this chronic pain and expressed disatisifaction at the lack of answers or intervention thus far.  Assessment Pelvic pain for 4-5 months, with likely incidental microscopic hematuria and small uterine fibroids It is very unlikely this pain is due to a bladder problem as she has no urinary symptoms, unless there were advanced, invasive disease but she has no systemic changes Endometriosis should also be considered in the absence of other explanations, and would not be readily apparent on ultrasound and she has not had advanced abdominal imaging. Plan MRI pelvis to evaluate for endometriosis, structural abnormalities, or bladder wall abnormalities Recommended continuing supportive measures with NSAIDs, soaking, pelvic floor muscle exercises Recommended follow up with gynecology clinic while symptoms persist  Anophthalmos of left eye She is having more pain behind her prosthetic left eye for the past few  weeks. She has tried using lubricating drops for this without noticing much benefit. She says this has been a problem before due to inadequate fat pad according to her eye doctor. She has had transient inflammation at this site years ago that was felt to be self limited. Plan Referral to ophthalmology  GERD She continues to have upper abdominal discomfort despite taking pantoprazole 40mg  BID. Her complaint is mostly of bloating and constipation rather than actual heartburn. She has a history of chronic epigastric pain with early satiety, although her weight is actually slightly increased today compared to previous measurements. She is scheduled for upper endoscopy in December with Dr. Darrel Hoover. Assessment GERD, ongoing symptoms despite maximal PPI dose She requests to try a different medicine than protonix for this problem. On chart review she has previously used omprazole, dexilant, and ranitidine all without satisfaction. I discussed this with her but she wants to try a change today Plan Change to omeprazole for acid suppression Follow up with GI for endoscopy in December   Past Medical History:  Diagnosis Date  . ALLERGIC RHINITIS 10/10/2009   Qualifier: Diagnosis of  By: Guy Sandifer DO, Darrick Penna    . CANNABIS ABUSE 04/17/2008   Qualifier: Diagnosis of  By: Redmond Pulling  MD, Mateo Flow    . Chronic abdominal pain    Chronic Abd distension / bloating. W/U includes CT ABD & pelvis 7/08 negative. EGD 5/09 Dr Penelope Coop :erythematous gastric mucosa and bx c/w chronic gastritis - was neg for H pylori. Duo polyp had path c/w peptic duodenitis  . Depression   . DEPRESSION 08/05/2006   Qualifier: Diagnosis of  By: Redmond Pulling  MD, Mateo Flow    . Depression   . GERD 08/05/2006   Qualifier: Diagnosis  of  By: Redmond Pulling  MD, Mateo Flow    . Goiter, unspecified 10/16/2009   Qualifier: Diagnosis of  By: Guy Sandifer DO, Darrick Penna    . Hemorrhoids 01/18/2012  . INSOMNIA 10/15/2008   Qualifier: Diagnosis of  By: Redmond Pulling  MD, Mateo Flow     . Irritable bowel syndrome 08/06/2009   Qualifier: Diagnosis of  By: Criss Alvine    . NICOTINE ADDICTION 07/06/2008   Started on Chantix.    . 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 Spontaneous abortion    2    Review of Systems: Review of Systems  Constitutional: Negative for chills and fever.  Respiratory: Negative for shortness of breath.   Cardiovascular: Negative for leg swelling.  Gastrointestinal: Positive for abdominal pain, constipation and heartburn.  Genitourinary: Negative for dysuria, frequency and urgency.  Musculoskeletal: Positive for back pain, joint pain and neck pain.  Neurological: Negative for sensory change.  Psychiatric/Behavioral: The patient has insomnia.      Physical Exam: Vitals:   12/16/16 1346  BP: 110/77  Pulse: 77  Temp: 98.1 F (36.7 C)  TempSrc: Oral  SpO2: 100%  Weight: 131 lb 4.8 oz (59.6 kg)   GENERAL- alert, co-operative, NAD HEENT- Prosthetic left eye, moist oral mucosa CARDIAC- RRR, no murmurs, rubs or gallops. RESP- CTAB, no wheezes or crackles. ABDOMEN- Small ventral hernia, abdomen is protuberant for body habitus, nontender and no palpable masses BACK- No CVA tenderness. EXTREMITIES- Symmetric, no pedal edema. SKIN- Warm, dry, No rash or lesion. PSYCH- Normal mood and affect, appropriate thought content and speech.   Assessment & Plan:   See encounters tab for problem based medical decision making.   Patient discussed with Dr. Beryle Beams

## 2016-12-18 NOTE — Assessment & Plan Note (Signed)
She was recently seen at [facility] for suspected uterine fibroid pain. Previous ED visit for this documented ultrasound of a 1.8cm fibroid. At gynecology office this was not felt to be explanatory of her symptoms but incidental instead. They also observed on urinalysis microscopic hematuria concerning for uroepithelial malignancy in a former smoker. Referral has been made to urology but will probably not be seen for at least several more weeks. During the interval low-dose tramadol has been ineffective in improving her pelvic pain. Oral naproxen was not very effective so she stopped taking it. She is continuing to soak which is partially beneficial to both her pelvic pain and hemorrhoidal pain.  She was very frustrated by this chronic pain and expressed disatisifaction at the lack of answers or intervention thus far.  Assessment Pelvic pain for 4-5 months, with likely incidental microscopic hematuria and small uterine fibroids It is very unlikely this pain is due to a bladder problem as she has no urinary symptoms, unless there were advanced, invasive disease but she has no systemic changes Endometriosis should also be considered in the absence of other explanations, and would not be readily apparent on ultrasound and she has not had advanced abdominal imaging. Plan MRI pelvis to evaluate for endometriosis, structural abnormalities, or bladder wall abnormalities Recommended continuing supportive measures with NSAIDs, soaking, pelvic floor muscle exercises Recommended follow up with gynecology clinic while symptoms persist

## 2016-12-18 NOTE — Assessment & Plan Note (Signed)
She is having more pain behind her prosthetic left eye for the past few weeks. She has tried using lubricating drops for this without noticing much benefit. She says this has been a problem before due to inadequate fat pad according to her eye doctor. She has had transient inflammation at this site years ago that was felt to be self limited. Plan Referral to ophthalmology

## 2016-12-18 NOTE — Assessment & Plan Note (Signed)
She continues to have upper abdominal discomfort despite taking pantoprazole 40mg  BID. Her complaint is mostly of bloating and constipation rather than actual heartburn. She has a history of chronic epigastric pain with early satiety, although her weight is actually slightly increased today compared to previous measurements. She is scheduled for upper endoscopy in December with Dr. Darrel Hoover. Assessment GERD, ongoing symptoms despite maximal PPI dose She requests to try a different medicine than protonix for this problem. On chart review she has previously used omprazole, dexilant, and ranitidine all without satisfaction. I discussed this with her but she wants to try a change today Plan Change to omeprazole for acid suppression Follow up with GI for endoscopy in December

## 2016-12-21 NOTE — Progress Notes (Signed)
Medicine attending: Medical history, presenting problems, physical findings, and medications, reviewed with resident physician Dr Christopher Rice on the day of the patient visit and I concur with his evaluation and management plan. 

## 2016-12-30 ENCOUNTER — Ambulatory Visit (HOSPITAL_COMMUNITY): Admission: RE | Admit: 2016-12-30 | Payer: Self-pay | Source: Ambulatory Visit

## 2017-01-01 ENCOUNTER — Encounter (HOSPITAL_COMMUNITY): Payer: Self-pay | Admitting: *Deleted

## 2017-01-01 ENCOUNTER — Other Ambulatory Visit: Payer: Self-pay

## 2017-01-01 NOTE — Progress Notes (Signed)
Pt denies SOB, chest pain, and being under the care of a cardiologist. Pt denies having a stress test, echo and cardiac cath. Pt denies having an EKG and chest x ray within the last year. Pt denies recent labs. Pt made aware to stop taking Aspirin, vitamins, fish oil and herbal medications. Do not take any NSAIDs ie: Ibuprofen, Advil, Naproxen (Aleve), Motrin, BC and Goody Powder or any medication containing Aspirin. Pt verbalized understanding of all pre-op instructions.

## 2017-01-04 ENCOUNTER — Ambulatory Visit (HOSPITAL_COMMUNITY)
Admission: RE | Admit: 2017-01-04 | Discharge: 2017-01-04 | Disposition: A | Discharge status: Home / Self Care | Payer: Self-pay | Source: Ambulatory Visit | Attending: Gastroenterology | Admitting: Gastroenterology

## 2017-01-04 ENCOUNTER — Ambulatory Visit (HOSPITAL_COMMUNITY): Payer: Self-pay | Admitting: Anesthesiology

## 2017-01-04 ENCOUNTER — Other Ambulatory Visit: Payer: Self-pay | Admitting: Internal Medicine

## 2017-01-04 ENCOUNTER — Encounter (HOSPITAL_COMMUNITY): Payer: Self-pay | Admitting: *Deleted

## 2017-01-04 ENCOUNTER — Encounter (HOSPITAL_COMMUNITY)
Admission: RE | Disposition: A | Discharge status: Home / Self Care | Payer: Self-pay | Source: Ambulatory Visit | Attending: Gastroenterology

## 2017-01-04 DIAGNOSIS — K2950 Unspecified chronic gastritis without bleeding: Secondary | ICD-10-CM | POA: Insufficient documentation

## 2017-01-04 DIAGNOSIS — K648 Other hemorrhoids: Secondary | ICD-10-CM | POA: Insufficient documentation

## 2017-01-04 DIAGNOSIS — K625 Hemorrhage of anus and rectum: Secondary | ICD-10-CM | POA: Insufficient documentation

## 2017-01-04 DIAGNOSIS — K219 Gastro-esophageal reflux disease without esophagitis: Secondary | ICD-10-CM

## 2017-01-04 DIAGNOSIS — K449 Diaphragmatic hernia without obstruction or gangrene: Secondary | ICD-10-CM | POA: Insufficient documentation

## 2017-01-04 DIAGNOSIS — Z87891 Personal history of nicotine dependence: Secondary | ICD-10-CM | POA: Insufficient documentation

## 2017-01-04 HISTORY — PX: ESOPHAGOGASTRODUODENOSCOPY (EGD) WITH PROPOFOL: SHX5813

## 2017-01-04 HISTORY — DX: Unspecified abdominal hernia without obstruction or gangrene: K46.9

## 2017-01-04 HISTORY — PX: COLONOSCOPY WITH PROPOFOL: SHX5780

## 2017-01-04 SURGERY — ESOPHAGOGASTRODUODENOSCOPY (EGD) WITH PROPOFOL
Anesthesia: Monitor Anesthesia Care

## 2017-01-04 MED ORDER — LACTATED RINGERS IV SOLN
INTRAVENOUS | Status: DC
  Administered 2017-01-04: 1000 mL via INTRAVENOUS

## 2017-01-04 MED ORDER — PROPOFOL 500 MG/50ML IV EMUL
INTRAVENOUS | Status: DC | PRN
  Administered 2017-01-04: 100 ug/kg/min via INTRAVENOUS

## 2017-01-04 MED ORDER — PROPOFOL 10 MG/ML IV BOLUS
INTRAVENOUS | Status: DC | PRN
  Administered 2017-01-04 (×3): 20 mg via INTRAVENOUS

## 2017-01-04 MED ORDER — MIDAZOLAM HCL 5 MG/5ML IJ SOLN
INTRAMUSCULAR | Status: DC | PRN
  Administered 2017-01-04: 2 mg via INTRAVENOUS

## 2017-01-04 SURGICAL SUPPLY — 25 items
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
## 2017-01-04 NOTE — Transfer of Care (Signed)
Immediate Anesthesia Transfer of Care Note  Patient: Stephanie Padilla  Procedure(s) Performed: ESOPHAGOGASTRODUODENOSCOPY (EGD) WITH PROPOFOL (N/A ) COLONOSCOPY WITH PROPOFOL (N/A )  Patient Location: Endoscopy Unit  Anesthesia Type:MAC  Level of Consciousness: awake, alert  and oriented  Airway & Oxygen Therapy: Patient Spontanous Breathing and Patient connected to nasal cannula oxygen  Post-op Assessment: Report given to RN and Post -op Vital signs reviewed and stable  Post vital signs: Reviewed and stable  Last Vitals:  Vitals:   01/04/17 0949  BP: 116/80  Pulse: 67  Resp: 16  Temp: 36.6 C  SpO2: 99%    Last Pain:  Vitals:   01/04/17 0949  TempSrc: Oral  PainSc: 8       Patients Stated Pain Goal: 5 (02/72/53 6644)  Complications: No apparent anesthesia complications

## 2017-01-04 NOTE — Op Note (Signed)
[facility] Patient Name: Stephanie Padilla Procedure Date : 01/04/2017 MRN: [id number] Attending MD: Wonda Horner , MD Date of Birth: June 29, 1965 CSN: [id number] Age: 51 Admit Type: Outpatient Procedure:                Upper GI endoscopy Indications:              Upper abdominal pain Providers:                Wonda Horner, MD, Cleda Daub, RN, Tinnie Gens,                            Technician, Rhae Lerner, CRNA Referring MD:              Medicines:                Propofol per Anesthesia Complications:            No immediate complications. Estimated Blood Loss:     Estimated blood loss: none. Procedure:                Pre-Anesthesia Assessment:                           - Prior to the procedure, a History and Physical                            was performed, and patient medications and                            allergies were reviewed. The patient's tolerance of                            previous anesthesia was also reviewed. The risks                            and benefits of the procedure and the sedation                            options and risks were discussed with the patient.                            All questions were answered, and informed consent                            was obtained. Prior Anticoagulants: The patient has                            taken no previous anticoagulant or antiplatelet                            agents. ASA Grade Assessment: II - A patient with                            mild systemic disease. After reviewing the risks  and benefits, the patient was deemed in                            satisfactory condition to undergo the procedure.                           After obtaining informed consent, the endoscope was                            passed under direct vision. Throughout the                            procedure, the patient's blood pressure, pulse, and                            oxygen  saturations were monitored continuously. The                            EG-[id number] ([id number]) scope was introduced through the                            mouth, and advanced to the second part of duodenum.                            The upper GI endoscopy was accomplished without                            difficulty. The patient tolerated the procedure                            well. Scope In: Scope Out: Findings:      The examined esophagus was normal.      The Z-line was regular and was found 35 cm from the incisors.      A small hiatal hernia was present.      Normal mucosa was found in the entire examined stomach. Biopsies were       taken with a cold forceps for Helicobacter pylori testing.      The examined duodenum was normal. Impression:               - Normal esophagus.                           - Z-line regular, 35 cm from the incisors.                           - Small hiatal hernia.                           - Normal mucosa was found in the entire stomach.                            Biopsied.                           - Normal examined duodenum. Moderate Sedation:      .  Recommendation:           - Resume regular diet.                           - Continue present medications.                           - Return to referring physician PRN. Procedure Code(s):        --- Professional ---                           [id number], Esophagogastroduodenoscopy, flexible,                            transoral; with biopsy, single or multiple Diagnosis Code(s):        --- Professional ---                           K44.9, Diaphragmatic hernia without obstruction or                            gangrene                           R10.10, Upper abdominal pain, unspecified CPT copyright 2016 American Medical Association. All rights reserved. The codes documented in this report are preliminary and upon coder review may  be revised to meet current compliance requirements. Wonda Horner,  MD 01/04/2017 11:25:16 AM This report has been signed electronically. Number of Addenda: 0

## 2017-01-04 NOTE — Op Note (Signed)
[facility] Patient Name: Stephanie Padilla Procedure Date : 01/04/2017 MRN: [id number] Attending MD: Wonda Horner , MD Date of Birth: 11-28-1965 CSN: [id number] Age: 51 Admit Type: Outpatient Procedure:                Colonoscopy Indications:              Rectal bleeding Providers:                Wonda Horner, MD, Cleda Daub, RN, Tinnie Gens,                            Technician, Rhae Lerner, CRNA Referring MD:              Medicines:                Propofol per Anesthesia Complications:            No immediate complications. Estimated Blood Loss:     Estimated blood loss: none. Procedure:                Pre-Anesthesia Assessment:                           - Prior to the procedure, a History and Physical                            was performed, and patient medications and                            allergies were reviewed. The patient's tolerance of                            previous anesthesia was also reviewed. The risks                            and benefits of the procedure and the sedation                            options and risks were discussed with the patient.                            All questions were answered, and informed consent                            was obtained. Prior Anticoagulants: The patient has                            taken no previous anticoagulant or antiplatelet                            agents. ASA Grade Assessment: II - A patient with                            mild systemic disease. After reviewing the risks  and benefits, the patient was deemed in                            satisfactory condition to undergo the procedure.                           - Prior to the procedure, a History and Physical                            was performed, and patient medications and                            allergies were reviewed. The patient's tolerance of                            previous anesthesia was  also reviewed. The risks                            and benefits of the procedure and the sedation                            options and risks were discussed with the patient.                            All questions were answered, and informed consent                            was obtained. Prior Anticoagulants: The patient has                            taken no previous anticoagulant or antiplatelet                            agents. ASA Grade Assessment: II - A patient with                            mild systemic disease. After reviewing the risks                            and benefits, the patient was deemed in                            satisfactory condition to undergo the procedure.                           After obtaining informed consent, the colonoscope                            was passed under direct vision. Throughout the                            procedure, the patient's blood pressure, pulse, and  oxygen saturations were monitored continuously. The                            Endoscope was introduced through the anus and                            advanced to the the cecum, identified by                            appendiceal orifice and ileocecal valve. The                            colonoscopy was performed without difficulty. The                            patient tolerated the procedure well. The quality                            of the bowel preparation was fair. The ileocecal                            valve, appendiceal orifice, and rectum were                            photographed. Scope In: 11:05:00 AM Scope Out: 11:19:36 AM Scope Withdrawal Time: 0 hours 4 minutes 58 seconds  Total Procedure Duration: 0 hours 14 minutes 36 seconds  Findings:      The perianal and digital rectal examinations were normal.      Internal hemorrhoids were found during retroflexion. The hemorrhoids       were small.      The exam was otherwise  without abnormality. Impression:               - Preparation of the colon was fair.                           - Internal hemorrhoids.                           - The examination was otherwise normal.                           - No specimens collected. Moderate Sedation:      . Recommendation:           - Resume regular diet.                           - Continue present medications.                           - Repeat colonoscopy in 10 years for screening                            purposes. Procedure Code(s):        --- Professional ---  45378, Colonoscopy, flexible; diagnostic, including                            collection of specimen(s) by brushing or washing,                            when performed (separate procedure) Diagnosis Code(s):        --- Professional ---                           K64.8, Other hemorrhoids                           K62.5, Hemorrhage of anus and rectum CPT copyright 2016 American Medical Association. All rights reserved. The codes documented in this report are preliminary and upon coder review may  be revised to meet current compliance requirements. Wonda Horner, MD 01/04/2017 11:28:29 AM This report has been signed electronically. Number of Addenda: 0

## 2017-01-04 NOTE — Telephone Encounter (Signed)
Patient is requesting a refill on acid reflex

## 2017-01-04 NOTE — H&P (Signed)
The patient is a 51 year old female here today for EGD and colonoscopy because of upper abdominal pain and rectal bleeding.  Physical:  No distress  Heart regular rhythm  Lungs clear  Abdomen soft and nontender  Impression: Upper abdominal pain and rectal bleeding  Plan: EGD and colonoscopy

## 2017-01-04 NOTE — Anesthesia Postprocedure Evaluation (Signed)
Anesthesia Post Note  Patient: Unika R Kovich  Procedure(s) Performed: ESOPHAGOGASTRODUODENOSCOPY (EGD) WITH PROPOFOL (N/A ) COLONOSCOPY WITH PROPOFOL (N/A )     Patient location during evaluation: Endoscopy Anesthesia Type: MAC Level of consciousness: awake and sedated Pain management: pain level controlled Vital Signs Assessment: post-procedure vital signs reviewed and stable Respiratory status: spontaneous breathing, nonlabored ventilation, respiratory function stable and patient connected to nasal cannula oxygen Cardiovascular status: stable and blood pressure returned to baseline Postop Assessment: no apparent nausea or vomiting Anesthetic complications: no    Last Vitals:  Vitals:   01/04/17 1126 01/04/17 1136  BP: (!) 141/102 125/85  Pulse: 74 62  Resp: 19 (!) 21  Temp: 36.6 C   SpO2: 99% 100%    Last Pain:  Vitals:   01/04/17 1126  TempSrc: Oral  PainSc:                  Daire Okimoto,JAMES TERRILL

## 2017-01-04 NOTE — Anesthesia Procedure Notes (Signed)
Procedure Name: MAC Date/Time: 01/04/2017 10:46 AM Performed by: Kyung Rudd, CRNA Pre-anesthesia Checklist: Patient identified, Emergency Drugs available, Suction available and Patient being monitored Patient Re-evaluated:Patient Re-evaluated prior to induction Oxygen Delivery Method: Nasal cannula Induction Type: IV induction Placement Confirmation: positive ETCO2 Dental Injury: Teeth and Oropharynx as per pre-operative assessment

## 2017-01-04 NOTE — Discharge Instructions (Signed)

## 2017-01-06 ENCOUNTER — Ambulatory Visit (HOSPITAL_COMMUNITY)
Admission: RE | Admit: 2017-01-06 | Discharge: 2017-01-06 | Disposition: A | Discharge status: Home / Self Care | Payer: No Typology Code available for payment source | Source: Ambulatory Visit | Attending: Internal Medicine | Admitting: Internal Medicine

## 2017-01-06 DIAGNOSIS — D259 Leiomyoma of uterus, unspecified: Secondary | ICD-10-CM | POA: Insufficient documentation

## 2017-01-06 DIAGNOSIS — R102 Pelvic and perineal pain: Secondary | ICD-10-CM | POA: Insufficient documentation

## 2017-01-06 LAB — CREATININE, SERUM
Creatinine, Ser: 0.57 mg/dL (ref 0.44–1.00)
GFR calc Af Amer: >60 mL/min (ref >60)

## 2017-01-06 MED ORDER — GADOBENATE DIMEGLUMINE 529 MG/ML IV SOLN
10.0000 mL | Freq: Once | INTRAVENOUS | Status: AC
  Administered 2017-01-06: 10 mL via INTRAVENOUS

## 2017-01-07 ENCOUNTER — Ambulatory Visit: Payer: No Typology Code available for payment source

## 2017-02-04 MED FILL — PANTOPRAZOLE SOD DR 40 MG T: 40 | 30 days supply | Qty: 60 | Fill #1

## 2017-02-12 ENCOUNTER — Telehealth: Payer: Self-pay | Admitting: Internal Medicine

## 2017-02-12 NOTE — Telephone Encounter (Signed)
PT NEEDS REFILL ON EYE DROPS, ZYLET, TO [facility]

## 2017-02-12 NOTE — Telephone Encounter (Signed)
Called pt, eye dr gave her the eye drops, they were samples, she is ask to call eye dr office for refill or samples Pt also c/o hemorrhoidal pain, wants referral for surgery, appt 1/29 at 1315

## 2017-02-15 NOTE — Telephone Encounter (Signed)
We can address her hemorrhoidal pain during next appointment. Eye drops will be provided by her eye doctor.

## 2017-02-15 NOTE — Telephone Encounter (Signed)
Next appt 1/29 in [facility] per Epic.

## 2017-02-16 ENCOUNTER — Encounter: Payer: Self-pay | Admitting: Internal Medicine

## 2017-02-16 ENCOUNTER — Other Ambulatory Visit: Payer: Self-pay | Admitting: *Deleted

## 2017-02-16 ENCOUNTER — Telehealth: Payer: Self-pay

## 2017-02-16 ENCOUNTER — Other Ambulatory Visit: Payer: Self-pay

## 2017-02-16 ENCOUNTER — Ambulatory Visit (INDEPENDENT_AMBULATORY_CARE_PROVIDER_SITE_OTHER): Payer: No Typology Code available for payment source | Admitting: Internal Medicine

## 2017-02-16 VITALS — BP 113/79 | HR 109 | Temp 98.6°F | Ht 64.0 in | Wt 140.0 lb

## 2017-02-16 DIAGNOSIS — K219 Gastro-esophageal reflux disease without esophagitis: Secondary | ICD-10-CM

## 2017-02-16 DIAGNOSIS — K648 Other hemorrhoids: Secondary | ICD-10-CM

## 2017-02-16 DIAGNOSIS — K5900 Constipation, unspecified: Secondary | ICD-10-CM

## 2017-02-16 DIAGNOSIS — K625 Hemorrhage of anus and rectum: Secondary | ICD-10-CM

## 2017-02-16 DIAGNOSIS — F329 Major depressive disorder, single episode, unspecified: Secondary | ICD-10-CM

## 2017-02-16 DIAGNOSIS — G8929 Other chronic pain: Secondary | ICD-10-CM

## 2017-02-16 DIAGNOSIS — R102 Pelvic and perineal pain: Secondary | ICD-10-CM

## 2017-02-16 DIAGNOSIS — K589 Irritable bowel syndrome without diarrhea: Secondary | ICD-10-CM

## 2017-02-16 DIAGNOSIS — D259 Leiomyoma of uterus, unspecified: Secondary | ICD-10-CM

## 2017-02-16 DIAGNOSIS — K649 Unspecified hemorrhoids: Secondary | ICD-10-CM

## 2017-02-16 DIAGNOSIS — R3989 Other symptoms and signs involving the genitourinary system: Secondary | ICD-10-CM

## 2017-02-16 DIAGNOSIS — K439 Ventral hernia without obstruction or gangrene: Secondary | ICD-10-CM

## 2017-02-16 DIAGNOSIS — R159 Full incontinence of feces: Secondary | ICD-10-CM

## 2017-02-16 DIAGNOSIS — Z970 Presence of artificial eye: Secondary | ICD-10-CM

## 2017-02-16 MED ORDER — HYDROCORTISONE 2.5 % RE CREA: TOPICAL_CREAM | RECTAL | 1 refills | Status: AC

## 2017-02-16 MED FILL — PROCTOZONE-HC 2.5 % CREA: 2.5 | 15 days supply | Qty: 30 | Fill #0

## 2017-02-16 NOTE — Progress Notes (Signed)
   CC: hemorrhoid pain  HPI:  [name] is a 52 y.o. female with PMH significant for chronic abdominal pain, hemorrhoids, GERD, depression, and IBS presents for hemorrhoidal pain.  Hemorrhoids: She has had hemorrhoids for years. She endorses fecal incontinence that causes irritation of her bottom and progressively worsening pain recently. She endorses intermittent BRBPR on the toilet paper when she wipes and on the outside of the stool. She denies fevers or abdominal pain. Colonoscopy done for rectal bleeding in 12/2016 showed internal hemorrhoids. She has tried creams and a tablet that her gastroenterologist gave her, but this has not helped. She reports the only thing that has helped are soaks.  Chronic pelvic pain: She was seen by OB-GYN on 11/12/2016 with complaints of chronic pelvic pain. Vaginal U/S showed a 2cm uterine fibroid, which was felt to be incidental and not the cause of her pain. UA dipstick showed microscopic hematuria, so a referral was placed to Urology at [facility] for evaluation of possible etiologies of bladder pain. She was then seen in our clinic on 12/16/2016 where MRI showed 2cm fibroid as well as questionable adenomyosis. Today, she has persistent pelvic pain/pressure. She states it is worth when she is on her feet. She denies dysuria or other urinary symptoms.  Past Medical History:  Diagnosis Date  . ALLERGIC RHINITIS 10/10/2009   Qualifier: Diagnosis of  By: Guy Sandifer DO, Darrick Penna    . CANNABIS ABUSE 04/17/2008   Qualifier: Diagnosis of  By: Redmond Pulling  MD, Mateo Flow    . Chronic abdominal pain    Chronic Abd distension / bloating. W/U includes CT ABD & pelvis 7/08 negative. EGD 5/09 Dr Penelope Coop :erythematous gastric mucosa and bx c/w chronic gastritis - was neg for H pylori. Duo polyp had path c/w peptic duodenitis  . Depression   . DEPRESSION 08/05/2006   Qualifier: Diagnosis of  By: Redmond Pulling  MD, Mateo Flow    . Depression   . GERD 08/05/2006   Qualifier: Diagnosis of  By: Redmond Pulling  MD, Mateo Flow    . Goiter, unspecified 10/16/2009   Qualifier: Diagnosis of  By: Guy Sandifer DO, Darrick Penna    . Hemorrhoids 01/18/2012  . Hernia of abdominal cavity   . INSOMNIA 10/15/2008   Qualifier: Diagnosis of  By: Redmond Pulling  MD, Mateo Flow    . Irritable bowel syndrome 08/06/2009   Qualifier: Diagnosis of  By: Criss Alvine    . NICOTINE ADDICTION 07/06/2008   Started on Chantix.    . 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 Spontaneous abortion    2   Review of Systems:   GEN: Negative for fevers CV: Negative for chest pain PULM: Negative for shortness of breath GI: Negative for abdominal pain. Positive for constipation, BRBPR and hemorrhoidal pain. GU: Negative for dysuria. Positive for pelvic pressure and pain.  Physical Exam:  Vitals:   02/16/17 1418  BP: 113/79  Pulse: (!) 109  Temp: 98.6 F (37 C)  TempSrc: Oral  SpO2: 100%  Weight: 140 lb (63.5 kg)  Height: 5\' 4"  (1.626 m)   GEN: Sitting in chair comfortably in NAD HEENT: Prosthetic left eye CV: NR & RR, no m/r/g PULM: CTAB, no wheezes or crackles ABDOMEN: Small ventral hernia. Nontender, soft, +BS MSK: No LE edema SKIN: Capillary refill < 2sec.  Assessment & Plan:   See Encounters Tab for problem based charting.  Patient discussed with Dr. Daryll Drown

## 2017-02-16 NOTE — Telephone Encounter (Signed)
Leila from [facility] patient called stating that pt's referral for Urology was not in [place]. Dr. Vivien Rota made the referral to [facility] in October 2018.I called [facility] and spoke with Kennyth Lose. Kennyth Lose stated that she will resubmit the patient's financial application because they weren't able to get in touch with the patient.

## 2017-02-16 NOTE — Patient Instructions (Addendum)
FOLLOW-UP INSTRUCTIONS When: 2 months For: health maintenance What to bring: medications   Stephanie Padilla,  It was a pleasure to meet you today.  For your hemorrhoids, please try Anusol cream twice daily. Please ask the pharmacists for the correct way to apply this cream. We are also sending a referral for [facility] to evaluate you for surgery for the hemorrhoids. The financial counselor will call you to start this process.  For your pelvic pain, the pain may be coming from your bladder or your uterus. To evaluate your bladder, a referral is in place for [facility]. For possible uterus pain, I would recommend scheduling a follow up appointment with the OB-GYN doctors to discuss this with them.  Please follow up in our clinic in 2 months with your primary doctor.

## 2017-02-16 NOTE — Assessment & Plan Note (Signed)
Assessment C/o worsening pain, fecal incontinence that causes irritation of her bottom, as well as intermittent BRBPR from hemorrhoids. She has tried creams with no relief, she is not sure which ones she tried. She states the only thing that has helped is soaking. She saw GI in 12/2016, who did a colonoscopy and found internal hemorrhoids. She requests referral for possible surgical intervention. She has the [employer], so will send an external referral request to [facility]. The financial counselor will speak to her about possibility of an appointment. Patient is agreeable to this plan.  Plan - Trial Anusol cream 2 times daily - Referral placed to General Surgery at [facility]

## 2017-02-16 NOTE — Assessment & Plan Note (Signed)
Assessment Seen by Gynecology on 11/12/2016 - UA dipstick showed microscopic hematuria and 2cm fibroid visualized on pelvic ultrasound was felt to be incidental. Thus, referral was placed at that time to Urology at [facility] for evaluation of possible etiologies for pain arising from the bladder. This has not yet been completed due to miscommunication issues. We have contacted their office and a financial counselor will call patient to start this process.  She was then seen in our clinic on 12/16/2016 where MRI showed 2cm fibroid as well as questionable adenomyosis. Given her persistent pelvic pain/pressure, I think it is reasonable to consider endometriosis as a possible etiology for her chronic pain. I recommended patient contact her gynecologist's office to schedule a follow-up appointment for further evaluation. Patient was agreeable to this plan  Plan - Recommended patient contact GYN office to schedule follow-up appointment for evaluation of possible endometriosis - Patient to follow up with [facility] (we have contacted their office and the process is being initiated by financial counselor)

## 2017-02-22 ENCOUNTER — Ambulatory Visit: Payer: Self-pay

## 2017-02-22 NOTE — Progress Notes (Signed)
Internal Medicine Clinic Attending  Case discussed with Dr. Huang at the time of the visit.  We reviewed the resident's history and exam and pertinent patient test results.  I agree with the assessment, diagnosis, and plan of care documented in the resident's note. 

## 2017-05-19 ENCOUNTER — Telehealth: Payer: Self-pay

## 2017-05-19 DIAGNOSIS — K219 Gastro-esophageal reflux disease without esophagitis: Secondary | ICD-10-CM

## 2017-05-19 NOTE — Telephone Encounter (Signed)
Returned call to patient. Patient is not at home and does not have med list with her. Prozac, remeron and zyprexa have been prescribed by [facility]. Patient states they have run out of funding and are no longer able to fill these meds for her. Patient will call back when she gets home for complete list of med refills needed. Hubbard Hartshorn, RN, BSN

## 2017-05-19 NOTE — Telephone Encounter (Signed)
Requesting all meds to be filled at Outpatient pharmacy. Please call pt back.

## 2017-05-19 NOTE — Telephone Encounter (Signed)
Patient returned call. Requesting meds that are not on her med list. States she was getting these from different doctor. Appt made in [facility] tomorrow to discuss refill requests. Instructed to bring all medications with her. States she will L. Silvano Rusk, RN, BSN

## 2017-05-19 NOTE — Telephone Encounter (Signed)
Attempted to reach patient. No answer. Message left on VM requesting return call. Hubbard Hartshorn, RN, BSN

## 2017-05-19 NOTE — Telephone Encounter (Signed)
Thanks for taking care of her.

## 2017-05-19 NOTE — Telephone Encounter (Signed)
FLUoxetine (PROZAC) 20 MG capsule Pantoprazole, prazosin, mirtazapine (REMERON) 30 MG tablet, refill requeset @ outpatient pharmacy. Requesting to speak with a nurse about med.

## 2017-05-20 ENCOUNTER — Encounter: Payer: Self-pay | Admitting: Internal Medicine

## 2017-05-20 ENCOUNTER — Ambulatory Visit (INDEPENDENT_AMBULATORY_CARE_PROVIDER_SITE_OTHER): Payer: Self-pay | Admitting: Internal Medicine

## 2017-05-20 VITALS — BP 115/75 | HR 83 | Temp 98.4°F | Wt 144.0 lb

## 2017-05-20 DIAGNOSIS — F330 Major depressive disorder, recurrent, mild: Secondary | ICD-10-CM

## 2017-05-20 DIAGNOSIS — K219 Gastro-esophageal reflux disease without esophagitis: Secondary | ICD-10-CM

## 2017-05-20 DIAGNOSIS — Z598 Other problems related to housing and economic circumstances: Secondary | ICD-10-CM

## 2017-05-20 DIAGNOSIS — R51 Headache: Secondary | ICD-10-CM

## 2017-05-20 DIAGNOSIS — F515 Nightmare disorder: Secondary | ICD-10-CM | POA: Insufficient documentation

## 2017-05-20 DIAGNOSIS — F329 Major depressive disorder, single episode, unspecified: Secondary | ICD-10-CM

## 2017-05-20 DIAGNOSIS — Z79899 Other long term (current) drug therapy: Secondary | ICD-10-CM

## 2017-05-20 DIAGNOSIS — G4700 Insomnia, unspecified: Secondary | ICD-10-CM

## 2017-05-20 MED ORDER — PANTOPRAZOLE SODIUM 40 MG PO TBEC: 40.0000 mg | DELAYED_RELEASE_TABLET | Freq: Two times a day (BID) | ORAL | 0 refills | Status: DC

## 2017-05-20 MED ORDER — PRAZOSIN HCL 1 MG PO CAPS: 1.0000 mg | ORAL_CAPSULE | Freq: Every day | ORAL | 0 refills | Status: DC

## 2017-05-20 MED ORDER — FLUOXETINE HCL 20 MG PO CAPS: 40.0000 mg | ORAL_CAPSULE | Freq: Every day | ORAL | 0 refills | Status: DC

## 2017-05-20 MED ORDER — OLANZAPINE 10 MG PO TABS: 10.0000 mg | ORAL_TABLET | Freq: Every day | ORAL | 0 refills | Status: DC

## 2017-05-20 MED ORDER — MIRTAZAPINE 30 MG PO TABS: 30.0000 mg | ORAL_TABLET | Freq: Every day | ORAL | 0 refills | Status: DC

## 2017-05-20 MED FILL — PRAZOSIN 1 MG CAPSULE: 1 | 30 days supply | Qty: 30 | Fill #0

## 2017-05-20 MED FILL — MIRTAZAPINE 30 MG TABLET: 30 | 30 days supply | Qty: 30 | Fill #0

## 2017-05-20 MED FILL — OLANZapine 10 MG TABS: 10 | 30 days supply | Qty: 30 | Fill #0

## 2017-05-20 MED FILL — FLUoxetine HCL 20 MG CAPS: 20 | 30 days supply | Qty: 30 | Fill #0

## 2017-05-20 MED FILL — PANTOPRAZOLE SOD DR 40 MG T: 40 | 30 days supply | Qty: 60 | Fill #0

## 2017-05-20 NOTE — Progress Notes (Signed)
   CC: Unable to afford current psychiatry medications  HPI:  [name] is a 52 y.o. female with PMHx detailed below presenting because she is currently unable to afford refills for her psychiatric medications prescribed through [facility].  She previously was getting these medicines for 3 but has been informed [facility] finances are not allowing them to offer that at this time.  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  See problem based assessment and plan below for additional details.  Depression She appears to be doing significantly better on her current regimen than she had been last year.  Out-of-pocket cost for her are too high for her current psychiatric medicines.  She would be at significant risk with discontinuing these abruptly given the severity of her depression previously.  Her current medications include Zyprexa, Prozac, and Remeron.  She is additionally taking prazosin for nightmares disorder.  Plan: I consulted with our clinical pharmacist Dr. Maudie Mercury to help coordinate her medication access.  I have sent 1 month prescriptions to the [facility] outpatient pharmacy under the IM program hope fund as she reports being able to afford the associated for dollar price.  Pharmacy was also very helpful in coordinating application for [place] [facility].   Past Medical History:  Diagnosis Date  . ALLERGIC RHINITIS 10/10/2009   Qualifier: Diagnosis of  By: Guy Sandifer DO, Darrick Penna    . CANNABIS ABUSE 04/17/2008   Qualifier: Diagnosis of  By: Redmond Pulling  MD, Mateo Flow    . Chronic abdominal pain    Chronic Abd distension / bloating. W/U includes CT ABD & pelvis 7/08 negative. EGD 5/09 Dr Penelope Coop :erythematous gastric mucosa and bx c/w chronic gastritis - was neg for H pylori. Duo polyp had path c/w peptic duodenitis  . Depression   . DEPRESSION 08/05/2006   Qualifier: Diagnosis of  By: Redmond Pulling  MD,  Mateo Flow    . Depression   . GERD 08/05/2006   Qualifier: Diagnosis of  By: Redmond Pulling  MD, Mateo Flow    . Goiter, unspecified 10/16/2009   Qualifier: Diagnosis of  By: Guy Sandifer DO, Darrick Penna    . Hemorrhoids 01/18/2012  . Hernia of abdominal cavity   . INSOMNIA 10/15/2008   Qualifier: Diagnosis of  By: Redmond Pulling  MD, Mateo Flow    . Irritable bowel syndrome 08/06/2009   Qualifier: Diagnosis of  By: Criss Alvine    . NICOTINE ADDICTION 07/06/2008   Started on Chantix.    . 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 Spontaneous abortion    2    Review of Systems: Review of Systems  Constitutional: Negative for weight loss.  Skin: Negative for rash.  Neurological: Positive for headaches.  Psychiatric/Behavioral: The patient has insomnia.      Physical Exam: Vitals:   05/20/17 1433  BP: 115/75  Pulse: 83  Temp: 98.4 F (36.9 C)  TempSrc: Oral  SpO2: 100%  Weight: 144 lb (65.3 kg)   GENERAL- alert, co-operative, NAD HEENT-prosthetic left eye CARDIAC- RRR, no murmurs, rubs or gallops. RESP- CTAB, no wheezes or crackles. SKIN- Warm, dry, No rash or lesion. PSYCH- flat affect, appropriate thought content and speech.   Assessment & Plan:   See encounters tab for problem based medical decision making.   Patient discussed with Dr. Rebeca Alert

## 2017-05-25 NOTE — Assessment & Plan Note (Signed)
She appears to be doing significantly better on her current regimen than she had been last year.  Out-of-pocket cost for her are too high for her current psychiatric medicines.  She would be at significant risk with discontinuing these abruptly given the severity of her depression previously.  Her current medications include Zyprexa, Prozac, and Remeron.  She is additionally taking prazosin for nightmares disorder.  Plan: I consulted with our clinical pharmacist Dr. Maudie Mercury to help coordinate her medication access.  I have sent 1 month prescriptions to the [facility] outpatient pharmacy under the IM program hope fund as she reports being able to afford the associated for dollar price.  Pharmacy was also very helpful in coordinating application for [place] [facility].

## 2017-05-25 NOTE — Progress Notes (Signed)
Internal Medicine Clinic Attending  Case discussed with Dr. Rice  at the time of the visit.  We reviewed the resident's history and exam and pertinent patient test results.  I agree with the assessment, diagnosis, and plan of care documented in the resident's note.  Alexander N Raines, MD   

## 2017-06-25 ENCOUNTER — Ambulatory Visit (INDEPENDENT_AMBULATORY_CARE_PROVIDER_SITE_OTHER): Payer: Self-pay | Admitting: Internal Medicine

## 2017-06-25 ENCOUNTER — Other Ambulatory Visit: Payer: Self-pay

## 2017-06-25 ENCOUNTER — Encounter: Payer: Self-pay | Admitting: Internal Medicine

## 2017-06-25 VITALS — BP 124/85 | HR 66 | Temp 98.6°F | Ht 64.0 in | Wt 148.1 lb

## 2017-06-25 DIAGNOSIS — D259 Leiomyoma of uterus, unspecified: Secondary | ICD-10-CM

## 2017-06-25 DIAGNOSIS — R631 Polydipsia: Secondary | ICD-10-CM

## 2017-06-25 DIAGNOSIS — F330 Major depressive disorder, recurrent, mild: Secondary | ICD-10-CM

## 2017-06-25 DIAGNOSIS — R7309 Other abnormal glucose: Secondary | ICD-10-CM | POA: Insufficient documentation

## 2017-06-25 DIAGNOSIS — R358 Other polyuria: Secondary | ICD-10-CM

## 2017-06-25 DIAGNOSIS — G8929 Other chronic pain: Secondary | ICD-10-CM

## 2017-06-25 DIAGNOSIS — F329 Major depressive disorder, single episode, unspecified: Secondary | ICD-10-CM

## 2017-06-25 LAB — POCT GLYCOSYLATED HEMOGLOBIN (HGB A1C): Hemoglobin A1C: 5.5 % (ref 4.0–5.6)

## 2017-06-25 LAB — GLUCOSE, CAPILLARY: Glucose-Capillary: 97 mg/dL (ref 65–99)

## 2017-06-25 NOTE — Assessment & Plan Note (Signed)
A1c checked because of her complaint of polyuria and polydipsia. It was 5.5.  She is not diabetic. 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

## 2017-06-25 NOTE — Patient Instructions (Addendum)
Thank you for visiting clinic today. We are checking A1c to make sure that you are not having diabetes. Also giving you a referral to see a gynecologist for your fibroid, they will call you with appointment. Please follow-up as needed.

## 2017-06-25 NOTE — Assessment & Plan Note (Signed)
Was recently seen in the clinic because of her inability to afford her psych medication. She can now afford it from [place] with IM program. She was also provided assistan to enroll with [facility].

## 2017-06-25 NOTE — Progress Notes (Signed)
   CC: To get a referral to see a gynecologist for her uterine fibroid.  HPI:  [name] is a 52 y.o. with past medical history as listed below came to the clinic to get a referral to see a gynecologist for her uterine fibroids as she has an orange card now.  Continue to experience chronic pelvic pain with no relief.  She denies any vaginal discharge or abnormal bleeding.  She is also having polyuria and polydipsia, denies any urinary urgency, painful micturition, or hematuria.  Please see assessment and plan for her chronic conditions.  Past Medical History:  Diagnosis Date  . ALLERGIC RHINITIS 10/10/2009   Qualifier: Diagnosis of  By: Guy Sandifer DO, Darrick Penna    . CANNABIS ABUSE 04/17/2008   Qualifier: Diagnosis of  By: Redmond Pulling  MD, Mateo Flow    . Chronic abdominal pain    Chronic Abd distension / bloating. W/U includes CT ABD & pelvis 7/08 negative. EGD 5/09 Dr Penelope Coop :erythematous gastric mucosa and bx c/w chronic gastritis - was neg for H pylori. Duo polyp had path c/w peptic duodenitis  . Depression   . DEPRESSION 08/05/2006   Qualifier: Diagnosis of  By: Redmond Pulling  MD, Mateo Flow    . Depression   . GERD 08/05/2006   Qualifier: Diagnosis of  By: Redmond Pulling  MD, Mateo Flow    . Goiter, unspecified 10/16/2009   Qualifier: Diagnosis of  By: Guy Sandifer DO, Darrick Penna    . Hemorrhoids 01/18/2012  . Hernia of abdominal cavity   . INSOMNIA 10/15/2008   Qualifier: Diagnosis of  By: Redmond Pulling  MD, Mateo Flow    . Irritable bowel syndrome 08/06/2009   Qualifier: Diagnosis of  By: Criss Alvine    . NICOTINE ADDICTION 07/06/2008   Started on Chantix.    . 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 Spontaneous abortion    2   Review of Systems: Negative except mentioned in HPI.  Physical Exam:  Vitals:   06/25/17 1548  BP: 124/85  Pulse: 66  Temp: 98.6 F (37 C)  TempSrc: Oral  SpO2: 100%  Weight: 148 lb 1.6 oz (67.2 kg)  Height: 5\' 4"  (1.626 m)   Vitals:   06/25/17 1548  BP: 124/85  Pulse: 66  Temp: 98.6 F (37 C)  TempSrc: Oral  SpO2: 100%  Weight: 148 lb 1.6 oz (67.2 kg)  Height: 5\' 4"  (1.626 m)   General: Vital signs reviewed.  Patient is well-developed and well-nourished, in no acute distress and cooperative with exam.  Head: Normocephalic and atraumatic. Eyes: EOMI, conjunctivae normal, no scleral icterus.  Cardiovascular: RRR, S1 normal, S2 normal, no murmurs, gallops, or rubs. Pulmonary/Chest: Clear to auscultation bilaterally, no wheezes, rales, or rhonchi. Abdominal: Soft, non-tender, non-distended, BS +, no masses, organomegaly, or guarding present.  Extremities: No lower extremity edema bilaterally,  pulses symmetric and intact bilaterally. No cyanosis or clubbing. Neurological: A&O x3, Strength is normal and symmetric bilaterally, cranial nerve II-XII are grossly intact, no focal motor deficit, sensory intact to light touch bilaterally.  Skin: Warm, dry and intact. No rashes or erythema. Psychiatric: Flat and irritable affect.  Assessment & Plan:   See Encounters Tab for problem based charting.  Patient discussed with Dr. Evette Doffing.

## 2017-06-25 NOTE — Assessment & Plan Note (Signed)
She denies any vaginal discharge or abnormal bleeding. Continue to experience pelvic pain. She has pelvic pain for a long time and on investigation found to have uterine fibroids.  She was asking for a referral to see a gynecologist as she has orange card now. She was given a referral to see a gynecologist in the past, unable to make up for that appointment because of financial reasons.  -New referral to see a gynecologist was provided.

## 2017-06-28 NOTE — Progress Notes (Signed)
Internal Medicine Clinic Attending  Case discussed with Dr. Amin at the time of the visit.  We reviewed the resident's history and exam and pertinent patient test results.  I agree with the assessment, diagnosis, and plan of care documented in the resident's note.    

## 2017-07-05 ENCOUNTER — Encounter: Payer: Self-pay | Admitting: Obstetrics & Gynecology

## 2017-07-27 ENCOUNTER — Other Ambulatory Visit: Payer: Self-pay | Admitting: Internal Medicine

## 2017-07-27 DIAGNOSIS — K219 Gastro-esophageal reflux disease without esophagitis: Secondary | ICD-10-CM

## 2017-07-27 MED ORDER — PANTOPRAZOLE SODIUM 40 MG PO TBEC: 40.0000 mg | DELAYED_RELEASE_TABLET | Freq: Two times a day (BID) | ORAL | 0 refills | Status: DC

## 2017-07-27 NOTE — Telephone Encounter (Signed)
Needs to have Protonix 40mg  tablet sent to [facility] (pt doesn't have the information phone/faxed) pt contact # [phone]

## 2017-08-03 ENCOUNTER — Other Ambulatory Visit: Payer: Self-pay | Admitting: Internal Medicine

## 2017-08-03 DIAGNOSIS — K219 Gastro-esophageal reflux disease without esophagitis: Secondary | ICD-10-CM

## 2017-08-03 MED ORDER — PANTOPRAZOLE SODIUM 40 MG PO TBEC: 40.0000 mg | DELAYED_RELEASE_TABLET | Freq: Two times a day (BID) | ORAL | 3 refills | Status: DC

## 2017-08-03 NOTE — Telephone Encounter (Signed)
NEEDS REFILL ON PROTONIX 40 MG TO [facility]

## 2017-08-04 MED FILL — PANTOPRAZOLE SOD DR 40 MG T: 40 | 30 days supply | Qty: 60 | Fill #0

## 2017-08-16 ENCOUNTER — Encounter: Payer: Self-pay | Admitting: Obstetrics & Gynecology

## 2017-08-16 ENCOUNTER — Ambulatory Visit (INDEPENDENT_AMBULATORY_CARE_PROVIDER_SITE_OTHER): Payer: Self-pay | Admitting: Obstetrics & Gynecology

## 2017-08-16 VITALS — BP 133/89 | HR 76 | Ht 64.0 in | Wt 146.8 lb

## 2017-08-16 DIAGNOSIS — R102 Pelvic and perineal pain: Secondary | ICD-10-CM

## 2017-08-16 DIAGNOSIS — G8929 Other chronic pain: Secondary | ICD-10-CM

## 2017-08-16 DIAGNOSIS — D219 Benign neoplasm of connective and other soft tissue, unspecified: Secondary | ICD-10-CM
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
## 2017-08-16 NOTE — Patient Instructions (Signed)
Return to clinic for any scheduled appointments or for any gynecologic concerns as needed.   

## 2017-08-19 ENCOUNTER — Encounter (HOSPITAL_COMMUNITY): Payer: Self-pay

## 2017-08-19 ENCOUNTER — Ambulatory Visit (HOSPITAL_COMMUNITY)
Admission: RE | Admit: 2017-08-19 | Discharge: 2017-08-19 | Disposition: A | Discharge status: Home / Self Care | Payer: Self-pay | Source: Ambulatory Visit | Attending: Obstetrics & Gynecology | Admitting: Obstetrics & Gynecology

## 2017-08-19 DIAGNOSIS — D219 Benign neoplasm of connective and other soft tissue, unspecified: Secondary | ICD-10-CM

## 2017-08-19 DIAGNOSIS — G8929 Other chronic pain: Secondary | ICD-10-CM | POA: Insufficient documentation

## 2017-08-19 DIAGNOSIS — R102 Pelvic and perineal pain: Secondary | ICD-10-CM | POA: Insufficient documentation

## 2017-08-19 DIAGNOSIS — D259 Leiomyoma of uterus, unspecified: Secondary | ICD-10-CM | POA: Insufficient documentation

## 2017-09-01 ENCOUNTER — Ambulatory Visit: Payer: Self-pay

## 2017-12-07 ENCOUNTER — Ambulatory Visit (INDEPENDENT_AMBULATORY_CARE_PROVIDER_SITE_OTHER): Payer: Self-pay | Admitting: Internal Medicine

## 2017-12-07 ENCOUNTER — Encounter: Payer: Self-pay | Admitting: Internal Medicine

## 2017-12-07 ENCOUNTER — Other Ambulatory Visit: Payer: Self-pay

## 2017-12-07 VITALS — BP 98/76 | HR 80 | Temp 98.6°F | Ht 64.0 in | Wt 122.9 lb

## 2017-12-07 DIAGNOSIS — Z79899 Other long term (current) drug therapy: Secondary | ICD-10-CM

## 2017-12-07 DIAGNOSIS — Z87891 Personal history of nicotine dependence: Secondary | ICD-10-CM

## 2017-12-07 DIAGNOSIS — M25511 Pain in right shoulder: Secondary | ICD-10-CM | POA: Insufficient documentation

## 2017-12-07 DIAGNOSIS — F339 Major depressive disorder, recurrent, unspecified: Secondary | ICD-10-CM

## 2017-12-07 DIAGNOSIS — F330 Major depressive disorder, recurrent, mild: Secondary | ICD-10-CM

## 2017-12-07 DIAGNOSIS — K219 Gastro-esophageal reflux disease without esophagitis: Secondary | ICD-10-CM

## 2017-12-07 MED ORDER — NAPROXEN 500 MG PO TABS: 500.0000 mg | ORAL_TABLET | Freq: Two times a day (BID) | ORAL | 2 refills | Status: DC | PRN

## 2017-12-07 MED ORDER — PANTOPRAZOLE SODIUM 40 MG PO TBEC: 40.0000 mg | DELAYED_RELEASE_TABLET | Freq: Two times a day (BID) | ORAL | 3 refills | Status: DC

## 2017-12-07 MED FILL — PANTOPRAZOLE SOD DR 40 MG T: 40 | 30 days supply | Qty: 60 | Fill #0

## 2017-12-07 MED FILL — NAPROXEN 500 MG TABLET: 500 | 30 days supply | Qty: 60 | Fill #0

## 2017-12-07 NOTE — Assessment & Plan Note (Signed)
She reports that the pain has been going on for a few weeks now, she had hit it against a wall a few months ago and thinks that she injured it at that time. She states it's a constant, aching pain. She reports that the pain is worse when she is sitting and improved when she lifts it at a 90 degree angle. She has tried tylenol which helps ease the pain. She also reports some numbness and tingling going down her arm to her first 3 fingers and some weakness in her arm. She reports that it is not related to the time of day. Denies any fevers, chills, nausea, vomiting, diaphoresis, stiffness of her joint, or limited mobility.   On exam she has some point tenderness over the biceps tendon, positive beer can test and scratch test. Had some numbness and tingling in digits 1, 2 and 3. She has normal strength, full ROM, biceps and triceps reflexes 2+ and symmetric. It appears that this is a biceps tendinopathy. We discussed that we will have a trial of NSAIDs for a week, we discussed that if her pain does not improve we will likely do a steroid injection.   Plan: -7 Days of naproxen  -Provided information about shoulder pain -Advised patient to contact us in 7 days if symptoms do not improve, if pain has not improved she will need to be scheduled when an attending that does shoulder joint injections is on

## 2017-12-07 NOTE — Assessment & Plan Note (Signed)
Reports that the pantoprazole is working well, requesting refill.

## 2017-12-07 NOTE — Patient Instructions (Addendum)
Thank you for allowing [place] to provide your care today. Today we discussed your shoulder pain. I am sorry that you are feeling bad. I think that your pain is related to inflammation of the tendons in your shoulder.   I have sent in a prescription of Naproxen to the pharmacy, please take this for the next 7 days and call us if your pain does not improve.  I have also sent in a referral for physical therapy.  I am sorry that you are feeling depressed, please follow up with [facility].   Please follow-up as needed.    Should you have any questions or concerns please call the internal medicine clinic at [phone].    Shoulder Pain Many things can cause shoulder pain, including:  An injury to the area.  Overuse of the shoulder.  Arthritis.  The source of the pain can be:  Inflammation.  An injury to the shoulder joint.  An injury to a tendon, ligament, or bone.  Follow these instructions at home: Take these actions to help with your pain:  Squeeze a soft ball or a foam pad as much as possible. This helps to keep the shoulder from swelling. It also helps to strengthen the arm.  Take over-the-counter and prescription medicines only as told by your health care provider.  If directed, apply ice to the area: ? Put ice in a plastic bag. ? Place a towel between your skin and the bag. ? Leave the ice on for 20 minutes, 2-3 times per day. Stop applying ice if it does not help with the pain.  If you were given a shoulder sling or immobilizer: ? Wear it as told. ? Remove it to shower or bathe. ? Move your arm as little as possible, but keep your hand moving to prevent swelling.  Contact a health care provider if:  Your pain gets worse.  Your pain is not relieved with medicines.  New pain develops in your arm, hand, or fingers. Get help right away if:  Your arm, hand, or fingers: ? Tingle. ? Become numb. ? Become swollen. ? Become painful. ? Turn white or blue. This  information is not intended to replace advice given to you by your health care provider. Make sure you discuss any questions you have with your health care provider. Document Released: 10/15/2004 Document Revised: 09/01/2015 Document Reviewed: 04/30/2014 Elsevier Interactive Patient Education  [employer].

## 2017-12-07 NOTE — Assessment & Plan Note (Signed)
Reports that she has been feeling more depressed recently, she has been taking her medications with no issues. PHQ-9 was 18 today. She denied any SI or MI. She follows with [facility] and is going to make an appointment with them soon.

## 2017-12-07 NOTE — Progress Notes (Signed)
CC: Shoulder pain  HPI:  Stephanie Padilla is a 52 y.o.  with a PMH listed below presenting for right shoulder pain.    She reports that the pain has been going on for a few weeks now, she had hit it against a wall a few months ago and thinks that she injured it at that time. She states it's a constant, aching pain. She reports that the pain is worse when she is sitting and improved when she lifts it at a 90 degree angle. She has tried tylenol which helps ease the pain. She also reports some numbness and tingling going down her arm to her first 3 fingers and some weakness in her arm. She reports that it is not related to the time of day. Denies any fevers, chills, nausea, vomiting, diaphoresis, stiffness of her joint, or limited mobility.   Please see A&P for status of the patient's chronic medical conditions  Past Medical History:  Diagnosis Date  . ALLERGIC RHINITIS 10/10/2009   Qualifier: Diagnosis of  By: Guy Sandifer DO, Darrick Penna    . CANNABIS ABUSE 04/17/2008   Qualifier: Diagnosis of  By: Redmond Pulling  MD, Mateo Flow    . Chronic abdominal pain    Chronic Abd distension / bloating. W/U includes CT ABD & pelvis 7/08 negative. EGD 5/09 Dr Penelope Coop :erythematous gastric mucosa and bx c/w chronic gastritis - was neg for H pylori. Duo polyp had path c/w peptic duodenitis  . Depression   . DEPRESSION 08/05/2006   Qualifier: Diagnosis of  By: Redmond Pulling  MD, Mateo Flow    . Depression   . GERD 08/05/2006   Qualifier: Diagnosis of  By: Redmond Pulling  MD, Mateo Flow    . Goiter, unspecified 10/16/2009   Qualifier: Diagnosis of  By: Guy Sandifer DO, Darrick Penna    . Hemorrhoids 01/18/2012  . Hernia of abdominal cavity   . INSOMNIA 10/15/2008   Qualifier: Diagnosis of  By: Redmond Pulling  MD, Mateo Flow    . Irritable bowel syndrome 08/06/2009   Qualifier: Diagnosis of  By: Criss Alvine    . NICOTINE ADDICTION 07/06/2008   Started on Chantix.    . 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 Spontaneous  abortion    2   Review of Systems: Refer to history of present illness and assessment and plans for pertinent review of systems, all others reviewed and negative.  Physical Exam:  Vitals:   12/07/17 0853  BP: 98/76  Pulse: 80  Temp: 98.6 F (37 C)  TempSrc: Oral  SpO2: 100%  Weight: 122 lb 14.4 oz (55.7 kg)  Height: 5\' 4"  (1.626 m)   Physical Exam  Constitutional: She is oriented to person, place, and time and well-developed, well-nourished, and in no distress.  HENT:  Head: Normocephalic and atraumatic.  Neck: Normal range of motion. Neck supple. No thyromegaly present.  Cardiovascular: Normal rate, regular rhythm and normal heart sounds. Exam reveals no gallop and no friction rub.  No murmur heard. Pulmonary/Chest: Effort normal and breath sounds normal. No respiratory distress. She has no wheezes.  Abdominal: Soft. Bowel sounds are normal. She exhibits no distension.  Musculoskeletal: Normal range of motion. She exhibits tenderness. She exhibits no edema.  Point tenderness over the biceps tendon, positive beer can test and apley scratch test. Full ROM of both shoulder joints.  Neurological: She is alert and oriented to person, place, and time. Gait normal.  Numbness and tingling in digits 1, 2 and 3. 5/5 strength in upper  and lower extremity, biceps and triceps reflexes 2+ and symmetric.  Skin: Skin is warm and dry. No erythema.  Psychiatric: Mood normal.  Flat affect    Social History   Socioeconomic History  . Marital status: Single    Spouse name: Not on file  . Number of children: Not on file  . Years of education: Not on file  . Highest education level: Not on file  Occupational History  . Not on file  Social Needs  . Financial resource strain: Not on file  . Food insecurity:    Worry: Not on file    Inability: Not on file  . Transportation needs:    Medical: Not on file    Non-medical: Not on file  Tobacco Use  . Smoking status: Former Smoker     Packs/day: 0.50    Types: Cigarettes    Last attempt to quit: 01/27/2013    Years since quitting: 4.8  . Smokeless tobacco: Current User  . Tobacco comment: quit x 5 months.  Substance and Sexual Activity  . Alcohol use: No    Alcohol/week: 0.0 standard drinks  . Drug use: Not Currently    Comment: Marijuana.; former  . Sexual activity: Not Currently    Birth control/protection: None  Lifestyle  . Physical activity:    Days per week: Not on file    Minutes per session: Not on file  . Stress: Not on file  Relationships  . Social connections:    Talks on phone: Not on file    Gets together: Not on file    Attends religious service: Not on file    Active member of club or organization: Not on file    Attends meetings of clubs or organizations: Not on file    Relationship status: Not on file  . Intimate partner violence:    Fear of current or ex partner: Not on file    Emotionally abused: Not on file    Physically abused: Not on file    Forced sexual activity: Not on file  Other Topics Concern  . Not on file  Social History Narrative   Lives with son. 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 In relationship. Has medicaid. Got GED. Smoked cigs 10-15 yrs 1PPD> Restarted 3 months in 2011 but quit after 3 months.    Family History  Problem Relation Age of Onset  . Cancer Mother 41       presumed breast ca  . Cancer Paternal Grandmother        colon cancer  . Cancer - Colon Unknown        Uncle     Assessment & Plan:   See Encounters Tab for problem based charting.  Patient seen with Dr. Daryll Drown

## 2017-12-08 ENCOUNTER — Ambulatory Visit: Payer: Self-pay

## 2017-12-13 ENCOUNTER — Ambulatory Visit: Payer: Self-pay

## 2017-12-15 ENCOUNTER — Ambulatory Visit: Payer: Self-pay | Admitting: Physical Therapy

## 2017-12-15 NOTE — Progress Notes (Signed)
Internal Medicine Clinic Attending  I saw and evaluated the patient.  I personally confirmed the key portions of the history and exam documented by Dr. Krienke and I reviewed pertinent patient test results.  The assessment, diagnosis, and plan were formulated together and I agree with the documentation in the resident's note.    

## 2017-12-28 ENCOUNTER — Encounter: Payer: Self-pay | Admitting: Internal Medicine

## 2017-12-28 ENCOUNTER — Other Ambulatory Visit: Payer: Self-pay

## 2017-12-28 ENCOUNTER — Ambulatory Visit (INDEPENDENT_AMBULATORY_CARE_PROVIDER_SITE_OTHER): Payer: Self-pay | Admitting: Internal Medicine

## 2017-12-28 VITALS — BP 103/76 | HR 79 | Temp 98.4°F | Ht 64.0 in | Wt 127.6 lb

## 2017-12-28 DIAGNOSIS — M25511 Pain in right shoulder: Secondary | ICD-10-CM

## 2017-12-28 DIAGNOSIS — G8911 Acute pain due to trauma: Secondary | ICD-10-CM

## 2017-12-28 DIAGNOSIS — M7551 Bursitis of right shoulder: Secondary | ICD-10-CM

## 2017-12-28 DIAGNOSIS — F330 Major depressive disorder, recurrent, mild: Secondary | ICD-10-CM

## 2017-12-28 DIAGNOSIS — F329 Major depressive disorder, single episode, unspecified: Secondary | ICD-10-CM

## 2017-12-28 NOTE — Patient Instructions (Signed)
Ms. Dykes,  I am sorry to hear that your right shoulder pain did not improve since your last visit.  Today we did a steroid injection for your right shoulder pain.  You should hopefully feel some relief and improvement within the next few days; however, if your symptoms do not improve please give [place] a call by Friday or Monday and we will order some imaging of that area.

## 2017-12-28 NOTE — Progress Notes (Signed)
   CC: Right shoulder pain  HPI:  [name] is a 52 y.o. with a PMHx listed below presenting to the clinic today for worsening of her right shoulder pain.  For details of today's visit and the status of his chronic medical issues please refer to the assessment and plan.  Past Medical History:  Diagnosis Date  . ALLERGIC RHINITIS 10/10/2009   Qualifier: Diagnosis of  By: Guy Sandifer DO, Darrick Penna    . CANNABIS ABUSE 04/17/2008   Qualifier: Diagnosis of  By: Redmond Pulling  MD, Mateo Flow    . Chronic abdominal pain    Chronic Abd distension / bloating. W/U includes CT ABD & pelvis 7/08 negative. EGD 5/09 Dr Penelope Coop :erythematous gastric mucosa and bx c/w chronic gastritis - was neg for H pylori. Duo polyp had path c/w peptic duodenitis  . Depression   . DEPRESSION 08/05/2006   Qualifier: Diagnosis of  By: Redmond Pulling  MD, Mateo Flow    . Depression   . GERD 08/05/2006   Qualifier: Diagnosis of  By: Redmond Pulling  MD, Mateo Flow    . Goiter, unspecified 10/16/2009   Qualifier: Diagnosis of  By: Guy Sandifer DO, Darrick Penna    . Hemorrhoids 01/18/2012  . Hernia of abdominal cavity   . INSOMNIA 10/15/2008   Qualifier: Diagnosis of  By: Redmond Pulling  MD, Mateo Flow    . Irritable bowel syndrome 08/06/2009   Qualifier: Diagnosis of  By: Criss Alvine    . NICOTINE ADDICTION 07/06/2008   Started on Chantix.    . 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 Spontaneous abortion    2   Review of Systems:   Review of Systems  Musculoskeletal: Positive for joint pain and myalgias.       Right shoulder pain  Neurological: Positive for tingling, sensory change and weakness.       Numbness and tingling in her right hand, tingling in her right forearm     Physical Exam:  Vitals:   12/28/17 0935  BP: 103/76  Pulse: 79  Temp: 98.4 F (36.9 C)  TempSrc: Oral  SpO2: 98%  Weight: 127 lb 9.6 oz (57.9 kg)  Height: 5\' 4"  (1.626 m)   Physical Exam  Constitutional: She is oriented to person, place,  and time and well-developed, well-nourished, and in no distress.  Musculoskeletal: She exhibits tenderness.  Point tenderness over biceps tendon, + beer can test, + apley's scratch test, full active and passive ROM   Neurological: She is alert and oriented to person, place, and time.  Grip strength intact, bilateral UE strength 5/5  Skin: Skin is warm and dry.  Psychiatric: Memory and judgment normal.    Assessment & Plan:   See Encounters Tab for problem based charting.  Patient seen with Dr. Rebeca Alert

## 2017-12-28 NOTE — Assessment & Plan Note (Addendum)
She was initially seen for this right shoulder pain on 11/19 after hitting her shoulder against a wall and it appeared to be a biceps tendinopathy. She reports that the pain is worse and her numbness and tingling has expanded from her first three digits to her entire hand now. She was given 7 days of naproxen during her last visit and reported that this provided no relief.   On exam she continues to have point tenderness over the biceps tendon, positive beer can test and Apley's scratch test. Had some numbness and tingling in her forearm and now entire hand. She has normal strength, full ROM, biceps and triceps reflexes 2+ and symmetric. Although this is concerning for biceps tendinopathy, her worsening numbness and tingling is worrisome for cervical radiculopathy vs other neurological injuries.  If her symptoms do not improve by the end of the week we will consider imaging and referral to sports med or Ortho.  Plan: - Steroid injection from right shoulder today - If symptoms persist or worsen we will consider MRI imaging and referral to sports medicine or Ortho   PROCEDURE NOTE  PROCEDURE: right shoulder joint steroid injection.  PREOPERATIVE DIAGNOSIS: Bursitis of the right shoulder.  POSTOPERATIVE DIAGNOSIS: Bursitis of the right shoulder.  PROCEDURE: The patient was apprised of the risks and the benefits of the procedure and informed consent was obtained, as witnessed by Dr. Harlow Ohms. Time-out procedure was performed, with confirmation of the patient's name, date of birth, and correct identification of the right shoulder to be injected. The patient's shoulder was then marked at the appropriate site for injection placement. The shoulder was sterilely prepped with Betadine. A 1 mg solution of Kenalog was drawn up into a 3 mL syringe with a 2 mL of 1% lidocaine. The patient was injected with a 25 gauge needle at the lateral  aspect of her  right shoulder by Dr. Kalman Shan. There were no  complications. The patient tolerated the procedure well. There was no bleeding. The patient was instructed to ice her shoulder upon leaving clinic and refrain from overuse over the next 3 days. The patient was instructed to return to the clinic with any usual pain, swelling, or redness occurred in the injected area.   The procedure was supervised by attending physician, Dr. Angelia Mould.

## 2017-12-28 NOTE — Assessment & Plan Note (Signed)
PHQ-9 was 18 again today, he missed 2 weeks ago.  She reported she was supposed to follow-up with [facility] however her therapist no longer works there and she is waiting to make an appointment with a new female therapist.  No suicidal ideations at this time.  She was informed that there is an Optician, dispensing however she prefers to go to [place].

## 2017-12-30 NOTE — Progress Notes (Signed)
Internal Medicine Clinic Attending  I saw and evaluated the patient.  I personally confirmed the key portions of the history and exam documented by Dr. Laural Golden and I reviewed pertinent patient test results.  The assessment, diagnosis, and plan were formulated together and I agree with the documentation in the resident's note.  Here with worsening right shoulder pain, mostly localized to [name], started after fall on that shoulder. She has worsening numbness and tingling of her hand, suggestive of brachial plexus irritation, likely as it passes through the shoulder joint. It's path is typically between the rotator cuff and the overlying pectoralis and biceps short head insertions. Possible that inflammation in this area is contributing to nerve irritation, she may benefit from steroid injection. No evidence of rotator cuff tear requiring more urgent referral.   Lenice Pressman, M.D., Ph.D.

## 2018-01-17 ENCOUNTER — Telehealth: Payer: Self-pay | Admitting: Internal Medicine

## 2018-01-17 ENCOUNTER — Telehealth: Payer: Self-pay

## 2018-01-17 ENCOUNTER — Ambulatory Visit (HOSPITAL_COMMUNITY)
Admission: RE | Admit: 2018-01-17 | Discharge: 2018-01-17 | Disposition: A | Discharge status: Home / Self Care | Payer: Medicaid Other | Source: Ambulatory Visit | Attending: Internal Medicine | Admitting: Internal Medicine

## 2018-01-17 DIAGNOSIS — M25511 Pain in right shoulder: Secondary | ICD-10-CM

## 2018-01-17 MED ORDER — MELOXICAM 15 MG PO TABS: 15.0000 mg | ORAL_TABLET | Freq: Every day | ORAL | 0 refills | Status: AC

## 2018-01-17 MED FILL — MELOXICAM 15 MG TABLET: 15 | 30 days supply | Qty: 30 | Fill #0

## 2018-01-17 NOTE — Telephone Encounter (Signed)
Pt called and notified to stop taking Naproxen and start taking Meloxicam.  Medication purpose, dosage, desired effects, and potential side effects discussed, pt verbalized understanding.  Pt also informed MD recommends Xray of Right shoulder before deciding on POC, pt verbalizes understanding and states she will have Xray in next couple of days. Thanks, SChaplin, RN,BSN

## 2018-01-17 NOTE — Telephone Encounter (Signed)
Called [place], reviewed Xray- no concerning findings, previous concern from Dr Rebeca Alert was possible nerve entrapment at brachial plexus.  Overall I discussed we will change Ibuprofen to Meloxicam.  I will place referral to sports medicine for evaluation.

## 2018-01-17 NOTE — Telephone Encounter (Signed)
I have placed an order for Xray of her right shoulder, this will likely need to be obtained before MRI would be approved.  Depending on results of Xray she could be referred to Sports medicine versus MRI. Could you please call and let her know to get the xray done as the next step.

## 2018-01-17 NOTE — Telephone Encounter (Signed)
Also D/C Naproxen and she may try Meloxicam 15mg  daily instead.

## 2018-01-17 NOTE — Telephone Encounter (Signed)
Needs to speak with a nurse about shoulder pain, please call pt back.

## 2018-01-17 NOTE — Telephone Encounter (Signed)
Returned call to patient.  She states no relief after steroid injection to shoulder on 12/28/17, c/o worsening pain in right shoulder, neck, with numbness/tingling in right fingers .  Per last visit note, pt to call [facility] if she is still having shoulder pain for possible MRI and/or sports medicine referral.  Pt states she does have an orange card.  Pt also requesting different pain med, taking Naproxen BID and tylenol, not helping.   SChaplin, RN,BSN

## 2018-01-26 ENCOUNTER — Ambulatory Visit: Payer: Self-pay

## 2018-01-26 ENCOUNTER — Ambulatory Visit: Payer: Self-pay | Admitting: Sports Medicine

## 2018-02-02 ENCOUNTER — Ambulatory Visit: Payer: Self-pay | Admitting: Sports Medicine

## 2018-03-03 NOTE — Addendum Note (Signed)
Addended by: Hulan Fray on: 03/03/2018 07:28 AM   Modules accepted: Orders

## 2018-07-17 ENCOUNTER — Encounter: Payer: Self-pay | Admitting: *Deleted

## 2018-07-18 ENCOUNTER — Other Ambulatory Visit: Payer: Self-pay

## 2018-07-18 ENCOUNTER — Ambulatory Visit: Payer: Medicaid Other | Admitting: Internal Medicine

## 2018-07-18 DIAGNOSIS — B351 Tinea unguium: Secondary | ICD-10-CM

## 2018-07-18 DIAGNOSIS — H44002 Unspecified purulent endophthalmitis, left eye: Secondary | ICD-10-CM

## 2018-07-18 DIAGNOSIS — Z970 Presence of artificial eye: Secondary | ICD-10-CM | POA: Diagnosis not present

## 2018-07-18 DIAGNOSIS — H44009 Unspecified purulent endophthalmitis, unspecified eye: Secondary | ICD-10-CM | POA: Insufficient documentation

## 2018-07-18 MED ORDER — EFINACONAZOLE 10 % EX SOLN: 1.0000 | Freq: Every day | CUTANEOUS | 2 refills | Status: DC

## 2018-07-18 MED ORDER — ERYTHROMYCIN 5 MG/GM OP OINT: 1.0000 "application " | TOPICAL_OINTMENT | Freq: Every day | OPHTHALMIC | 0 refills | Status: AC

## 2018-07-18 MED FILL — ERYTHROMYCIN EYE OINTMENT: 5 | 7 days supply | Qty: 4 | Fill #0

## 2018-07-18 NOTE — Assessment & Plan Note (Addendum)
Patient reports pain of her right 1st toenail with tighter fitting shoes. On exam, Mild onychomycosis of all toenails, Moderate to severe at R great toe with tenderness to palpation. Will begin treatment with antifungals. Consider referral to podiatry if no response at follow up. - Efinaconazole solution, Apply daily to affected nails for 48 Days  ADDENDUM: Rx changed to Ciclopirox 8% Daily for 48 days due to insurance preference list

## 2018-07-18 NOTE — Patient Instructions (Addendum)
Thank you for allowing [place] to care for you  For your eye drainage and matting - We will refer you back to [facility] eye center  For your toenail fungal infection - Will start antifungal - Efinaconazole Daily to affected toenails

## 2018-07-18 NOTE — Assessment & Plan Note (Addendum)
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 She cleans the prosthesis daily and has noted the discharge as well as her eye crusting shut at times. There is increased lacrimation and trace discharge noted on exam, but not significant erythema nor edema. Patient has not been to an eye doctor in over a year. Will treat for eye socket / Prosthetic eye infection and refer to opthalmology. She has previously been seen at [facility]. - Erythromycin oitment Daily for 7 Days - Referral to Opthalmology

## 2018-07-18 NOTE — Progress Notes (Signed)
   CC: Left prosthetic eye drainage, Right great toenail pain   HPI:  [name] is a 53 y.o. F with PMHx listed below presenting for Left prosthetic eye drainage, Right great toenail pain. Please see the A&P for the status of the patient's chronic medical problems.  Past Medical History:  Diagnosis Date  . ALLERGIC RHINITIS 10/10/2009   Qualifier: Diagnosis of  By: Guy Sandifer DO, Darrick Penna    . CANNABIS ABUSE 04/17/2008   Qualifier: Diagnosis of  By: Redmond Pulling  MD, Mateo Flow    . Chronic abdominal pain    Chronic Abd distension / bloating. W/U includes CT ABD & pelvis 7/08 negative. EGD 5/09 Dr Penelope Coop :erythematous gastric mucosa and bx c/w chronic gastritis - was neg for H pylori. Duo polyp had path c/w peptic duodenitis  . Depression   . DEPRESSION 08/05/2006   Qualifier: Diagnosis of  By: Redmond Pulling  MD, Mateo Flow    . Depression   . GERD 08/05/2006   Qualifier: Diagnosis of  By: Redmond Pulling  MD, Mateo Flow    . Goiter, unspecified 10/16/2009   Qualifier: Diagnosis of  By: Guy Sandifer DO, Darrick Penna    . Hemorrhoids 01/18/2012  . Hernia of abdominal cavity   . INSOMNIA 10/15/2008   Qualifier: Diagnosis of  By: Redmond Pulling  MD, Mateo Flow    . Irritable bowel syndrome 08/06/2009   Qualifier: Diagnosis of  By: Criss Alvine    . NICOTINE ADDICTION 07/06/2008   Started on Chantix.    . 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 Spontaneous abortion    2   Review of Systems:  Performed and all others negative.  Physical Exam:  Vitals:   07/18/18 1454  BP: 99/77  Pulse: 81  Temp: 98.3 F (36.8 C)  TempSrc: Oral  SpO2: 99%  Weight: 128 lb 14.4 oz (58.5 kg)   Physical Exam Constitutional:      General: She is not in acute distress.    Appearance: Normal appearance.  Eyes:     Comments: Left Eye prothesis in place. Tearing noted with trace yellowish discharge. No significant periorbital erthyema or edema.  Cardiovascular:     Rate and Rhythm: Normal rate and regular  rhythm.     Pulses: Normal pulses.     Heart sounds: Normal heart sounds.  Pulmonary:     Effort: Pulmonary effort is normal. No respiratory distress.     Breath sounds: Normal breath sounds.  Abdominal:     General: Bowel sounds are normal. There is no distension.     Palpations: Abdomen is soft.     Tenderness: There is no abdominal tenderness.  Musculoskeletal:        General: No swelling or deformity.  Feet:     Comments: Mild onychomycosis of all toenails, Moderate to severe at R great toe with tenderness to palpation. Skin:    General: Skin is warm and dry.  Neurological:     General: No focal deficit present.     Mental Status: Mental status is at baseline.    Assessment & Plan   See Encounters Tab for problem based charting.  Patient discussed with Dr. Rebeca Alert

## 2018-07-19 ENCOUNTER — Telehealth: Payer: Self-pay | Admitting: *Deleted

## 2018-07-19 MED ORDER — CICLOPIROX 8 % EX KIT: 1.0000 | PACK | Freq: Every day | CUTANEOUS | 1 refills | Status: AC

## 2018-07-19 NOTE — Telephone Encounter (Signed)
PA for Jublia 10% not covered by Medicaid. Formulary meds are Ciclopirox cream and solution, Clotrimazole Rx Cream, Clotrimazole-Betamethasone Cream, Ketoconazole Cream, Nyamye Powder, Or Nystop Powder.  Kist was sent to Dr. Trilby Drummer to consider a change.Sander Nephew, RN 07/19/2018 10:58 AM.

## 2018-07-19 NOTE — Progress Notes (Signed)
Internal Medicine Clinic Attending  Case discussed with Dr. Melvin at the time of the visit.  We reviewed the resident's history and exam and pertinent patient test results.  I agree with the assessment, diagnosis, and plan of care documented in the resident's note.  Alexander Raines, M.D., Ph.D.  

## 2018-07-19 NOTE — Addendum Note (Signed)
Addended by: Neva Seat B on: 07/19/2018 08:20 PM   Modules accepted: Orders

## 2018-07-19 NOTE — Telephone Encounter (Signed)
Thank you for letting me know. Her prescription has been changed to Ciclopirox and sent to the pharmacy.

## 2018-07-20 MED FILL — CICLOPIROX 8% SOLUTION: 8 | 6 days supply | Qty: 7 | Fill #0

## 2019-03-31 ENCOUNTER — Other Ambulatory Visit: Payer: Self-pay

## 2019-03-31 ENCOUNTER — Ambulatory Visit: Payer: Medicaid Other | Admitting: Internal Medicine

## 2019-03-31 VITALS — BP 105/78 | HR 97 | Temp 99.3°F | Ht 64.0 in | Wt 117.7 lb

## 2019-03-31 DIAGNOSIS — H44002 Unspecified purulent endophthalmitis, left eye: Secondary | ICD-10-CM

## 2019-03-31 MED ORDER — MOXIFLOXACIN HCL 0.5 % OP SOLN: 1.0000 [drp] | Freq: Three times a day (TID) | OPHTHALMIC | 0 refills | Status: AC

## 2019-03-31 MED FILL — MOXIFLOXACIN HCL 0.5 % SOLN: 0.5 | 20 days supply | Qty: 3 | Fill #0

## 2019-03-31 NOTE — Progress Notes (Signed)
Internal Medicine Clinic Attending  Case discussed with Dr. MacLean at the time of the visit.  We reviewed the resident's history and exam and pertinent patient test results.  I agree with the assessment, diagnosis, and plan of care documented in the resident's note.    

## 2019-03-31 NOTE — Patient Instructions (Signed)
You were seen for an infection of your left eye. I have sent you a prescription for eye drops and a referral to an ophthalmologist. They will call you to setup an appointment   Thank you for allowing [place] to be part of your medical care!

## 2019-03-31 NOTE — Assessment & Plan Note (Signed)
Patient reports that approximately 1 month ago she noticed increased yellow drainage from her left eye, crustiness upon awakening.  Patient denies pain, fever, chills.  Patient states she previously had an ophthalmologist, but he retired. *Referral sent to ophthalmology *Moxifloxacin eyedrops prescribed-1 drop 3 times a day to be applied with prosthesis removed, for duration of 7 days

## 2019-03-31 NOTE — Progress Notes (Addendum)
   CC: Infection of left eye  HPI: Patient is a 54 year old female with past medical history as below who presents for evaluation of left eye discharge.  [name] is a 54 y.o.   Past Medical History:  Diagnosis Date  . ALLERGIC RHINITIS 10/10/2009   Qualifier: Diagnosis of  By: Guy Sandifer DO, Darrick Penna    . CANNABIS ABUSE 04/17/2008   Qualifier: Diagnosis of  By: Redmond Pulling  MD, Mateo Flow    . Chronic abdominal pain    Chronic Abd distension / bloating. W/U includes CT ABD & pelvis 7/08 negative. EGD 5/09 Dr Penelope Coop :erythematous gastric mucosa and bx c/w chronic gastritis - was neg for H pylori. Duo polyp had path c/w peptic duodenitis  . Depression   . DEPRESSION 08/05/2006   Qualifier: Diagnosis of  By: Redmond Pulling  MD, Mateo Flow    . Depression   . GERD 08/05/2006   Qualifier: Diagnosis of  By: Redmond Pulling  MD, Mateo Flow    . Goiter, unspecified 10/16/2009   Qualifier: Diagnosis of  By: Guy Sandifer DO, Darrick Penna    . Hemorrhoids 01/18/2012  . Hernia of abdominal cavity   . INSOMNIA 10/15/2008   Qualifier: Diagnosis of  By: Redmond Pulling  MD, Mateo Flow    . Irritable bowel syndrome 08/06/2009   Qualifier: Diagnosis of  By: Criss Alvine    . NICOTINE ADDICTION 07/06/2008   Started on Chantix.    . 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 Spontaneous abortion    2   Review of Systems:   Review of Systems  Constitutional: Negative for chills and fever.  Eyes: Positive for discharge (yellow). Negative for pain.  All other systems reviewed and are negative.   Physical Exam:  Vitals:   03/31/19 0927  BP: 105/78  Pulse: 97  Temp: 99.3 F (37.4 C)  TempSrc: Oral  SpO2: 99%  Weight: 117 lb 11.2 oz (53.4 kg)  Height: 5\' 4"  (1.626 m)   Physical Exam: General Well developed, well-nourished, no acute distress  Pulmonary Breathing comfortably on room air, no cough, no distress   ENT No tracheal deviation, head is normocephalic, atraumatic Right eye without  discahrge Patient with no pain to palpation around bilateral orbits. Patient without pain with extraocular movements. No discharge seen. After left prosthetic eye removed, underlying sclera and conjunctiva visualized. Conjunctiva is mildly erythematous. No frank drainage visualized but small amount of yellow discharge is evident on posterior aspect of prosthetic.   Psychiatry Memory and judgement normal, affect normal  Skin Warm, dry, no rash, diaphoresis, erythema  Neurology Alert and answers questions appropriately, no gross deficit   Select prior notes and labs reviewed   Assessment & Plan:   See Encounters Tab for problem based charting.  Patient discussed with Dr. Dareen Piano

## 2019-03-31 NOTE — Addendum Note (Signed)
Addended byJeanmarie Hubert T on: 03/31/2019 10:19 AM   Modules accepted: Level of Service

## 2019-04-10 ENCOUNTER — Encounter: Payer: Medicaid Other | Admitting: Internal Medicine

## 2019-04-10 ENCOUNTER — Telehealth: Payer: Self-pay

## 2019-04-10 NOTE — Telephone Encounter (Addendum)
Pt was a no show at appt today, RN placed telephone call to patient and she states she is out of town.  Telehealth appt offered, she states in person appt would be better and asked if someone could call her at the end of the week to arrange reschedule w/ Dr. Sheppard Coil. Will forward to front desk to assist patient in rescheduling appt. Thank you, SChaplin, RN,BSN

## 2019-04-10 NOTE — Progress Notes (Deleted)
   CC: L eye drainage follow up   HPI:  Ms.Hang R Brunetto is a 54 y.o. with a hx as noted below who presents for L eye drainage follow up. Please refer to the problem based charting for details.  Mammogram: Due now, last one 2017 was normal Colonoscopy: Up to date. Normal in 2018 Pap smear:Due now,  Last one 2017 was normal   Past Medical History:  Diagnosis Date  . ALLERGIC RHINITIS 10/10/2009   Qualifier: Diagnosis of  By: Guy Sandifer DO, Darrick Penna    . CANNABIS ABUSE 04/17/2008   Qualifier: Diagnosis of  By: Redmond Pulling  MD, Mateo Flow    . Chronic abdominal pain    Chronic Abd distension / bloating. W/U includes CT ABD & pelvis 7/08 negative. EGD 5/09 Dr Penelope Coop :erythematous gastric mucosa and bx c/w chronic gastritis - was neg for H pylori. Duo polyp had path c/w peptic duodenitis  . Depression   . DEPRESSION 08/05/2006   Qualifier: Diagnosis of  By: Redmond Pulling  MD, Mateo Flow    . Depression   . GERD 08/05/2006   Qualifier: Diagnosis of  By: Redmond Pulling  MD, Mateo Flow    . Goiter, unspecified 10/16/2009   Qualifier: Diagnosis of  By: Guy Sandifer DO, Darrick Penna    . Hemorrhoids 01/18/2012  . Hernia of abdominal cavity   . INSOMNIA 10/15/2008   Qualifier: Diagnosis of  By: Redmond Pulling  MD, Mateo Flow    . Irritable bowel syndrome 08/06/2009   Qualifier: Diagnosis of  By: Criss Alvine    . NICOTINE ADDICTION 07/06/2008   Started on Chantix.    . 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 Spontaneous abortion    2   Review of Systems:  All systems were reviewed and are otherwise negative unless mentioned in the HPI.  Physical Exam:  There were no vitals filed for this visit.  Physical Exam   Assessment & Plan:   See Encounters Tab for problem based charting.  Patient discussed with Dr. {NAMES:[id number]::"Butcher","Guilloud","Hoffman","Mullen","Narendra","Raines","Vincent"}

## 2019-04-12 NOTE — Telephone Encounter (Signed)
Spoke with the patient. She has resch her appt for 04/17/2019 with Dr Sheppard Coil.

## 2019-04-17 ENCOUNTER — Telehealth: Payer: Self-pay | Admitting: *Deleted

## 2019-04-17 ENCOUNTER — Other Ambulatory Visit: Payer: Self-pay | Admitting: Internal Medicine

## 2019-04-17 ENCOUNTER — Ambulatory Visit: Payer: Medicaid Other | Admitting: Internal Medicine

## 2019-04-17 ENCOUNTER — Encounter: Payer: Self-pay | Admitting: Internal Medicine

## 2019-04-17 VITALS — BP 117/88 | HR 75 | Wt 118.5 lb

## 2019-04-17 DIAGNOSIS — Z87891 Personal history of nicotine dependence: Secondary | ICD-10-CM

## 2019-04-17 DIAGNOSIS — H5462 Unqualified visual loss, left eye, normal vision right eye: Secondary | ICD-10-CM

## 2019-04-17 DIAGNOSIS — H547 Unspecified visual loss: Secondary | ICD-10-CM

## 2019-04-17 DIAGNOSIS — K068 Other specified disorders of gingiva and edentulous alveolar ridge: Secondary | ICD-10-CM | POA: Diagnosis not present

## 2019-04-17 DIAGNOSIS — Z970 Presence of artificial eye: Secondary | ICD-10-CM | POA: Diagnosis not present

## 2019-04-17 DIAGNOSIS — F17210 Nicotine dependence, cigarettes, uncomplicated: Secondary | ICD-10-CM

## 2019-04-17 DIAGNOSIS — Z8669 Personal history of other diseases of the nervous system and sense organs: Secondary | ICD-10-CM

## 2019-04-17 DIAGNOSIS — Z0000 Encounter for general adult medical examination without abnormal findings: Secondary | ICD-10-CM

## 2019-04-17 DIAGNOSIS — H44002 Unspecified purulent endophthalmitis, left eye: Secondary | ICD-10-CM

## 2019-04-17 MED ORDER — VARENICLINE TARTRATE 0.5 MG PO TABS: ORAL_TABLET | ORAL | 0 refills | Status: DC

## 2019-04-17 MED ORDER — VARENICLINE TARTRATE 0.5 MG PO TABS: ORAL_TABLET | ORAL | 0 refills | Status: AC

## 2019-04-17 MED FILL — CHANTIX 0.5 MG TABLET: 0.5 | 30 days supply | Qty: 49 | Fill #0

## 2019-04-17 NOTE — Assessment & Plan Note (Addendum)
Pt states she wants to discuss smoking cessation.  The patient states that she was able to quit smoking for the past 3 years, but 3 or 4 months ago she started smoking about a half a pack a day again.  She has been smoking cigarettes on and off since she was a teenager.  She states at the time, she was under a lot of stress, and would prefer not to talk about it.  At this time, she is interested in finding ways to stop smoking.  Previously, she stopped by using nicotine patches.  We discussed the different modes of smoking cessation, including nicotine patches, and gum, lozenges, and Chantix.  The patient stated that she wanted to use Chantix this time around.  Risks and benefits were discussed, and the patient is aware of the potential side effects of vivid dreams and nausea. -Chantix taper prescribed -[employer] stop date 4/12

## 2019-04-17 NOTE — Progress Notes (Signed)
Internal Medicine Clinic Attending  Case discussed with Dr. Alexander at the time of the visit.  We reviewed the resident's history and exam and pertinent patient test results.  I agree with the assessment, diagnosis, and plan of care documented in the resident's note.  

## 2019-04-17 NOTE — Telephone Encounter (Signed)
Per pharmacy the next refill for chantix needs to be the 1mg . In order for medicaid to pay

## 2019-04-17 NOTE — Assessment & Plan Note (Signed)
Patient is overdue for her mammogram.  She states that she would like to get a referral to get it done. -Mammogram ordered -Patient refused flu shot

## 2019-04-17 NOTE — Assessment & Plan Note (Signed)
Patient states she has a nodule on her gum that has recently appeared.  She states that it is nonpainful, it is hard, and does not bleed.  It is annoying, but does not necessarily affect her very much.  On exam, she has a 2-3 cm round that is pearly in appearance.  Patient's dentist is Dr. Nyoka Cowden, and she states that she has not seen him since before the pandemic started.  Given the fact that the patient has an extensive history of smoking, she is at an increased risk for oral cancer.  I want her to get this evaluated soon as possible. -Advised the patient to schedule an appointment with Dr. Nyoka Cowden immediately for further evaluation

## 2019-04-17 NOTE — Assessment & Plan Note (Signed)
Patient states she has been experiencing blurred vision in her left eye for the last year.  The patient's been referred to ophthalmology several times in the past, but has either no showed or canceled the appointment.  She currently has an appointment with [facility] on 05/24/19 -F/u [facility] appointment

## 2019-04-17 NOTE — Patient Instructions (Addendum)
Thank you for allowing [place] to take care of you today.  Below is a summary of what we discussed:  1.  Smoking cessation -I prescribed medication called Chantix to help you stop smoking.  Your official quit date is on April 12.  It is okay to smoke while taking Chantix.  You may have some nausea or vivid dreams while taking this medication.  If you do, call us so we can discuss it.  2.  Gum nodule -Please see your dentist, Dr. Nyoka Cowden as soon as possible to get your gums looked at.  Please do not delay getting this appointment.  3.  Mammogram -I put in a referral for you to get your mammogram.  If the facility does not call you within 1 to 2 weeks, call us so we can help you get it set up.  4.  Follow-up -Please schedule a follow-up appointment in 2 to 3 months  If you have any questions or concerns, please feel free to reach out to [place].

## 2019-04-17 NOTE — Progress Notes (Signed)
   CC: Smoking cessation counseling  HPI:  [name] is a 54 y.o. with a hx as noted below who presents today for smoking cessation counseling. Please refer to the problem based charting for further details.   Mammogram: Due now, last one 2017 was normal Colonoscopy: Up to date. Normal in 2018 Pap smear: Due now, Last one 2017 was normal   Past Medical History:  Diagnosis Date  . ALLERGIC RHINITIS 10/10/2009   Qualifier: Diagnosis of  By: Guy Sandifer DO, Darrick Penna    . CANNABIS ABUSE 04/17/2008   Qualifier: Diagnosis of  By: Redmond Pulling  MD, Mateo Flow    . Chronic abdominal pain    Chronic Abd distension / bloating. W/U includes CT ABD & pelvis 7/08 negative. EGD 5/09 Dr Penelope Coop :erythematous gastric mucosa and bx c/w chronic gastritis - was neg for H pylori. Duo polyp had path c/w peptic duodenitis  . Depression   . DEPRESSION 08/05/2006   Qualifier: Diagnosis of  By: Redmond Pulling  MD, Mateo Flow    . Depression   . GERD 08/05/2006   Qualifier: Diagnosis of  By: Redmond Pulling  MD, Mateo Flow    . Goiter, unspecified 10/16/2009   Qualifier: Diagnosis of  By: Guy Sandifer DO, Darrick Penna    . Hemorrhoids 01/18/2012  . Hernia of abdominal cavity   . INSOMNIA 10/15/2008   Qualifier: Diagnosis of  By: Redmond Pulling  MD, Mateo Flow    . Irritable bowel syndrome 08/06/2009   Qualifier: Diagnosis of  By: Criss Alvine    . NICOTINE ADDICTION 07/06/2008   Started on Chantix.    . 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 Spontaneous abortion    2   Review of Systems:  All systems reviewed and are negative unless mentioned in the HPI.  Physical Exam:  Vitals:   04/17/19 1315  BP: 117/88  Pulse: 75  SpO2: 99%  Weight: 118 lb 8 oz (53.8 kg)   Physical Exam Vitals reviewed.  Constitutional:      General: She is not in acute distress.    Appearance: Normal appearance. She is not ill-appearing, toxic-appearing or diaphoretic.  HENT:     Head: Normocephalic and atraumatic.   Mouth/Throat:     Mouth: Mucous membranes are moist.     Pharynx: Oropharynx is clear.     Comments: 2 cm nontender, non mobile, pearly appearing mass on the upper anterior R portion of the patient's gums. Cardiovascular:     Rate and Rhythm: Normal rate and regular rhythm.     Pulses: Normal pulses.     Heart sounds: Normal heart sounds. No murmur. No friction rub. No gallop.   Pulmonary:     Effort: Pulmonary effort is normal. No respiratory distress.     Breath sounds: Normal breath sounds. No wheezing or rales.  Abdominal:     General: Bowel sounds are normal.     Palpations: Abdomen is soft.  Musculoskeletal:     Right lower leg: No edema.     Left lower leg: No edema.  Skin:    General: Skin is warm.  Neurological:     General: No focal deficit present.     Mental Status: She is alert and oriented to person, place, and time.  Psychiatric:        Mood and Affect: Mood normal.    Assessment & Plan:   See Encounters Tab for problem based charting.  Patient discussed with Dr. Philipp Ovens

## 2019-04-17 NOTE — Addendum Note (Signed)
Addended by: Earlene Plater on: 04/17/2019 02:33 PM   Modules accepted: Orders

## 2019-04-17 NOTE — Assessment & Plan Note (Addendum)
Resolved. 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 Pt was recently seen on 3/12 for the same complaint and was prescribed a 7 day course of Moxifloxacin eye drops which resolved the discharge.  Patient has no pain or discomfort in her eye at this time.  She has an appointment at the eye care center on 05/24/2019. -F/u at next visit

## 2019-04-17 NOTE — Telephone Encounter (Signed)
Sounds good, thank you 

## 2019-04-29 ENCOUNTER — Ambulatory Visit: Payer: Self-pay

## 2019-05-09 ENCOUNTER — Ambulatory Visit
Admission: RE | Admit: 2019-05-09 | Discharge: 2019-05-09 | Disposition: A | Discharge status: Home / Self Care | Payer: Medicaid Other | Source: Ambulatory Visit | Attending: Internal Medicine | Admitting: Internal Medicine

## 2019-05-09 ENCOUNTER — Other Ambulatory Visit: Payer: Self-pay

## 2019-05-09 DIAGNOSIS — Z0000 Encounter for general adult medical examination without abnormal findings: Secondary | ICD-10-CM

## 2019-05-18 ENCOUNTER — Ambulatory Visit: Payer: Self-pay | Attending: Internal Medicine

## 2019-05-18 DIAGNOSIS — Z23 Encounter for immunization: Secondary | ICD-10-CM

## 2019-05-18 NOTE — Progress Notes (Signed)
   Covid-19 Vaccination Clinic  Name:  Stephanie Padilla    MRN: [id number] DOB: Dec 02, 1965  05/18/2019  Ms. Stephanie Padilla was observed post Covid-19 immunization for 15 minutes without incident. She was provided with Vaccine Information Sheet and instruction to access the V-Safe system.   Ms. Stephanie Padilla was instructed to call 911 with any severe reactions post vaccine: Marland Kitchen Difficulty breathing  . Swelling of face and throat  . A fast heartbeat  . A bad rash all over body  . Dizziness and weakness   Immunizations Administered    Name Date Dose VIS Date Route   Pfizer COVID-19 Vaccine 05/18/2019 12:05 PM 0.3 mL 03/15/2018 Intramuscular   Manufacturer: [place]   Lot: [id number]   [place]: [id number]

## 2019-05-29 ENCOUNTER — Encounter: Payer: Self-pay | Admitting: *Deleted

## 2019-06-12 ENCOUNTER — Ambulatory Visit: Payer: Medicaid Other | Attending: Internal Medicine

## 2019-06-12 DIAGNOSIS — Z23 Encounter for immunization: Secondary | ICD-10-CM

## 2019-06-12 NOTE — Progress Notes (Signed)
   Covid-19 Vaccination Clinic  Name:  Stephanie Padilla    MRN: [id number] DOB: 1965/11/20  06/12/2019  [name] was observed post Covid-19 immunization for 15 minutes without incident. She was provided with Vaccine Information Sheet and instruction to access the V-Safe system.   [name] was instructed to call 911 with any severe reactions post vaccine: Marland Kitchen Difficulty breathing  . Swelling of face and throat  . A fast heartbeat  . A bad rash all over body  . Dizziness and weakness   Immunizations Administered    Name Date Dose VIS Date Route   Pfizer COVID-19 Vaccine 06/12/2019 10:12 AM 0.3 mL 03/15/2018 Intramuscular   Manufacturer: [employer]   Lot: [id number]   [place]: [id number]

## 2019-07-17 ENCOUNTER — Other Ambulatory Visit: Payer: Self-pay | Admitting: Oral Surgery

## 2019-09-26 ENCOUNTER — Ambulatory Visit (INDEPENDENT_AMBULATORY_CARE_PROVIDER_SITE_OTHER): Payer: Medicaid Other | Admitting: Student

## 2019-09-26 ENCOUNTER — Encounter: Payer: Self-pay | Admitting: Student

## 2019-09-26 ENCOUNTER — Other Ambulatory Visit: Payer: Self-pay

## 2019-09-26 VITALS — BP 127/91 | HR 98 | Temp 99.2°F | Ht 64.0 in | Wt 124.0 lb

## 2019-09-26 DIAGNOSIS — Z0000 Encounter for general adult medical examination without abnormal findings: Secondary | ICD-10-CM | POA: Diagnosis not present

## 2019-09-26 DIAGNOSIS — R251 Tremor, unspecified: Secondary | ICD-10-CM

## 2019-09-26 DIAGNOSIS — K068 Other specified disorders of gingiva and edentulous alveolar ridge: Secondary | ICD-10-CM

## 2019-09-26 DIAGNOSIS — E049 Nontoxic goiter, unspecified: Secondary | ICD-10-CM

## 2019-09-26 DIAGNOSIS — F331 Major depressive disorder, recurrent, moderate: Secondary | ICD-10-CM

## 2019-09-26 DIAGNOSIS — Z87891 Personal history of nicotine dependence: Secondary | ICD-10-CM

## 2019-09-26 DIAGNOSIS — H44002 Unspecified purulent endophthalmitis, left eye: Secondary | ICD-10-CM | POA: Diagnosis not present

## 2019-09-26 DIAGNOSIS — K098 Other cysts of oral region, not elsewhere classified: Secondary | ICD-10-CM | POA: Diagnosis not present

## 2019-09-26 LAB — POCT GLYCOSYLATED HEMOGLOBIN (HGB A1C): Hemoglobin A1C: 5.1 % (ref 4.0–5.6)

## 2019-09-26 LAB — GLUCOSE, CAPILLARY: Glucose-Capillary: 96 mg/dL (ref 70–99)

## 2019-09-26 NOTE — Assessment & Plan Note (Addendum)
Resolved.  Pathology showed benign cyst with negative for malignancy

## 2019-09-26 NOTE — Patient Instructions (Signed)
Ms. Boulier,   It was a pleasure getting to know you today.  Here is a summary what we have talked about today  1.  Depression: Please continue taking all your depressive medications.  Please continue to follow-up with your psychiatrist and therapist as scheduled.  Please call your psychiatrist to tell him about your hand shaking.  2.  Smoking: Congratulations on quitting smoking  3.  Thyroid goiter: I will check some lab work today.  We will talk about getting an ultrasound of your thyroid on the next visit.   Please contact us to be having questions or concerns.  Take care, [name], DO

## 2019-09-26 NOTE — Assessment & Plan Note (Signed)
Patient states that she quit smoking about 1 month ago.

## 2019-09-26 NOTE — Assessment & Plan Note (Addendum)
Patient states that she never had a thyroid ultrasound done before.    Plan:  -Obtain TSH, free T4 and T3 today.   -We will discuss thyroid ultrasound on the next visit.

## 2019-09-26 NOTE — Assessment & Plan Note (Signed)
Resolved

## 2019-09-26 NOTE — Progress Notes (Signed)
   CC: Routine visit  HPI:  [name] is a 54 y.o. with past medical history of depression, smoking, who is here for routine visit.  Please see problem based charting for further details.  Past Medical History:  Diagnosis Date  . ALLERGIC RHINITIS 10/10/2009   Qualifier: Diagnosis of  By: Guy Sandifer DO, Darrick Penna    . CANNABIS ABUSE 04/17/2008   Qualifier: Diagnosis of  By: Redmond Pulling  MD, Mateo Flow    . Chronic abdominal pain    Chronic Abd distension / bloating. W/U includes CT ABD & pelvis 7/08 negative. EGD 5/09 Dr Penelope Coop :erythematous gastric mucosa and bx c/w chronic gastritis - was neg for H pylori. Duo polyp had path c/w peptic duodenitis  . Depression   . DEPRESSION 08/05/2006   Qualifier: Diagnosis of  By: Redmond Pulling  MD, Mateo Flow    . Depression   . GERD 08/05/2006   Qualifier: Diagnosis of  By: Redmond Pulling  MD, Mateo Flow    . Goiter, unspecified 10/16/2009   Qualifier: Diagnosis of  By: Guy Sandifer DO, Darrick Penna    . Hemorrhoids 01/18/2012  . Hernia of abdominal cavity   . INSOMNIA 10/15/2008   Qualifier: Diagnosis of  By: Redmond Pulling  MD, Mateo Flow    . Irritable bowel syndrome 08/06/2009   Qualifier: Diagnosis of  By: Criss Alvine    . NICOTINE ADDICTION 07/06/2008   Started on Chantix.    . 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 Spontaneous abortion    2   Review of Systems: As per HPI  Physical Exam:  Physical Exam Constitutional:      General: She is not in acute distress. HENT:     Head: Normocephalic.  Eyes:     Comments: Prosthetic left eye.  No discharge  Cardiovascular:     Rate and Rhythm: Normal rate and regular rhythm.     Heart sounds: No murmur heard.   Pulmonary:     Effort: No respiratory distress.     Breath sounds: Normal breath sounds. No wheezing.  Musculoskeletal:     Cervical back: Normal range of motion.     Right lower leg: No edema.     Left lower leg: No edema.     Comments: +2 reflexes upper and lower  extremities bilaterally Negative for lower extremity rigidity No resting tremor or essential tremor of upper extremities  Skin:    General: Skin is warm.     Coloration: Skin is not jaundiced.  Neurological:     Mental Status: She is alert.  Psychiatric:     Comments: Sad affect     Vitals:   09/26/19 0851  BP: (!) 127/91  Pulse: 98  Temp: 99.2 F (37.3 C)  TempSrc: Oral  SpO2: 97%  Weight: 123 lb 15.4 oz (56.2 kg)  Height: 5\' 4"  (1.626 m)     Assessment & Plan:   See Encounters Tab for problem based charting.  Patient seen with Dr. Daryll Drown

## 2019-09-26 NOTE — Assessment & Plan Note (Addendum)
PHQ-9 score of 18 suggest moderately severe depression.  Patient states that she has been seeing a psychiatrist over at [facility] and the last visit 1 month ago.  She is also seeing a therapist monthly and the next appointment is tomorrow.  She said the meetings are helpful.  Her medications are prescribed by her psychiatrist.  Regarding her mood she said her mood has been up and down, mostly down.  She has poor sleep and low energy in the morning.  She said her appetite has been poor but her weights is up compared to last visit.  She denies suicidal thoughts.  She complains of hand jittery that started 1 month ago.  She states that no new medication was started at that time by the psychiatrist.  She denies flushing, extremity rigidity.  She has normal reflexes upper and lower extremity bilaterally.  No resting tremor or essential tremor noted during examination.  No lower extremity rigidity on examination.  This is unlikely to be serotonin syndrome but I advised her to call her psychiatrist to let him know.  She voiced understand.  Plan: -Continue Prozac 20 mg -Continue mirtazapine 30 mg -Continue prazosin 1 mg -Continue olanzapine 10 mg -Continue to follow-up with psychiatrist and therapist. -Call psychiatrist to report hand tremor symptoms

## 2019-09-27 ENCOUNTER — Other Ambulatory Visit: Payer: Self-pay | Admitting: Student

## 2019-09-27 DIAGNOSIS — E876 Hypokalemia: Secondary | ICD-10-CM

## 2019-09-27 LAB — LIPID PANEL
Chol/HDL Ratio: 2.9 ratio (ref 0.0–4.4)
Cholesterol, Total: 252 mg/dL — ABNORMAL HIGH (ref 100–199)
HDL: 86 mg/dL (ref >39)
LDL Chol Calc (NIH): 97 mg/dL (ref 0–99)
Triglycerides: 423 mg/dL — ABNORMAL HIGH (ref 0–149)
VLDL Cholesterol Cal: 69 mg/dL — ABNORMAL HIGH (ref 5–40)

## 2019-09-27 LAB — BMP8+ANION GAP
Anion Gap: 20 mmol/L — ABNORMAL HIGH (ref 10.0–18.0)
BUN/Creatinine Ratio: 10 (ref 9–23)
BUN: 6 mg/dL (ref 6–24)
CO2: 27 mmol/L (ref 20–29)
Calcium: 10 mg/dL (ref 8.7–10.2)
Chloride: 96 mmol/L (ref 96–106)
Creatinine, Ser: 0.58 mg/dL (ref 0.57–1.00)
GFR calc Af Amer: 123 mL/min/{1.73_m2} (ref >59)
GFR calc non Af Amer: 107 mL/min/{1.73_m2} (ref >59)
Glucose: 99 mg/dL (ref 65–99)
Potassium: 3.2 mmol/L — ABNORMAL LOW (ref 3.5–5.2)
Sodium: 143 mmol/L (ref 134–144)

## 2019-09-27 LAB — T3: T3, Total: 105 ng/dL (ref 71–180)

## 2019-09-27 LAB — T4, FREE: Free T4: 1.05 ng/dL (ref 0.82–1.77)

## 2019-09-27 LAB — TSH: TSH: 0.542 u[IU]/mL (ref 0.450–4.500)

## 2019-09-27 MED ORDER — POTASSIUM CHLORIDE ER 10 MEQ PO TBCR: 40.0000 meq | EXTENDED_RELEASE_TABLET | Freq: Once | ORAL | 0 refills | Status: DC

## 2019-09-27 MED FILL — POTASSIUM CHLORIDE CRYS ER: 10 | 1 days supply | Qty: 4 | Fill #0

## 2019-09-27 NOTE — Progress Notes (Signed)
I called and spoke to patient about her lab results.  Her TSH, T4 and T3 are within normal limits.  Her lipid profile remarkable for elevated triglycerides and total cholesterol.  No medication were initiated given 10 years ASCVD risk of 1.4%.  I advised her to choose healthier diet options.  Her potassium is low at 3.2 with an unclear etiology elevated anion gap of 20.  This hypokalemia is likely secondary to poor potassium intake because patient states that she does not eat a good amount of fruits or vegetables.  She has no know medications that caused hypokalemia.  I prescribed 40 mEq of K and encouraged patient to eat more fruit and vegetables that are rich in potassium.  She voiced understand.  We will recheck potassium on her next visit.

## 2019-09-27 NOTE — Progress Notes (Signed)
Internal Medicine Clinic Attending  I saw and evaluated the patient.  I personally confirmed the key portions of the history and exam documented by Dr. Alfonse Spruce and I reviewed pertinent patient test results.  The assessment, diagnosis, and plan were formulated together and I agree with the documentation in the resident's note.   Would consider ordering thyroid ultrasound for goiter noted in chart.  Will need documentation of size of nodules, etc and possibly yearly follow up.  Would recommend yearly TFTs.

## 2019-12-26 ENCOUNTER — Ambulatory Visit (INDEPENDENT_AMBULATORY_CARE_PROVIDER_SITE_OTHER): Payer: Medicaid Other | Admitting: Student

## 2019-12-26 ENCOUNTER — Other Ambulatory Visit: Payer: Self-pay

## 2019-12-26 ENCOUNTER — Encounter: Payer: Self-pay | Admitting: Student

## 2019-12-26 DIAGNOSIS — R630 Anorexia: Secondary | ICD-10-CM

## 2019-12-27 ENCOUNTER — Encounter: Payer: Self-pay | Admitting: Student

## 2019-12-27 NOTE — Progress Notes (Signed)
Internal Medicine Clinic Attending  Case discussed with Dr. Alfonse Spruce and f/u call placed together.   I agree with the assessment, diagnosis, and plan of care documented in the resident's note, including necessity of in person evaluation to address her concerning loss of weight.

## 2019-12-27 NOTE — Assessment & Plan Note (Signed)
Patient presents with a telehealth visit for chief complaint of poor appetite that has been going on for 2 weeks.  States that she is down to 100 pounds (was 123 pounds in September).  States that she is feeling hungry but does not feel in eating, expressed that the process of eating is impairing her appetite.  Patient is drinking 6 bottles of Ensure a day with water and Kool-Aid.  Patient states that she had some episodes of emesis last week but has stopped.  She still feeling nauseous but states that the nausea is not the cause of her poor appetite.  She denies palpitation, tremor, shortness of breath, abdominal pain, fever, cough, sore throat, sick contact, dysphagia, skin flushing.  Denies alcohol, cigarette smoke or drug use.  She endorses constipation, early satiety, chronic muscle stiffness, memory issue and poor concentration.  Patient denies any new medication added or social issue that was new in the last few weeks.  States that she is taking women One-A-Day vitamin.  When asking about her mood, state that she has been feeling more depressed moody and isolated.  Patient states that her poor appetite preceded her mood change.  She has talked to her behavioral health physician at [facility] 2 weeks ago and was told that she is already on a medication to help her appetite.  When asking about her medication list, patient stated that she is on fluoxetine, venlafaxine, mirtazapine, olanzapine and hydroxyzine.  Patient states that she has been on mirtazapine for over a year but it does not seem to help with appetite.  Her sleep is normal when she is taking the mirtazapine.  When asking about her goiter, patient states that her goiter has not changed in size.  Assessment and plan: The differential diagnoses for poor appetite are broad.  Her mood can certainly affect her appetite.  There is concern of her goiter that can cause issue with eating and swallowing food.  Cancer can also affect her appetite and  weight and causes weight loss.  Cannot completely exclude infectious cause.  Patient is advised to schedule an in person appointment for a thorough physical exam and lab works.  We will need CBC, CMP, order ultrasound, and up-to-date with annual cancer screening.  We will also assess her nutritional and fluid status.   I will also call her pharmacy to check on her medication list because patient is on both venlafaxine and fluoxetine at the same time.  Patient does not have symptoms of serotonin syndrome.

## 2019-12-27 NOTE — Progress Notes (Signed)
  [facility] Internal Medicine Residency Telephone Encounter Continuity Care Appointment  HPI:   This telephone encounter was created for Ms. Stephanie Padilla on 12/27/2019 for the following purpose/cc poor appetite.    Past Medical History:  Past Medical History:  Diagnosis Date  . ALLERGIC RHINITIS 10/10/2009   Qualifier: Diagnosis of  By: Guy Sandifer DO, Darrick Penna    . CANNABIS ABUSE 04/17/2008   Qualifier: Diagnosis of  By: Redmond Pulling  MD, Mateo Flow    . Chronic abdominal pain    Chronic Abd distension / bloating. W/U includes CT ABD & pelvis 7/08 negative. EGD 5/09 Dr Penelope Coop :erythematous gastric mucosa and bx c/w chronic gastritis - was neg for H pylori. Duo polyp had path c/w peptic duodenitis  . Depression   . DEPRESSION 08/05/2006   Qualifier: Diagnosis of  By: Redmond Pulling  MD, Mateo Flow    . Depression   . GERD 08/05/2006   Qualifier: Diagnosis of  By: Redmond Pulling  MD, Mateo Flow    . Goiter, unspecified 10/16/2009   Qualifier: Diagnosis of  By: Guy Sandifer DO, Darrick Penna    . Hemorrhoids 01/18/2012  . Hernia of abdominal cavity   . INSOMNIA 10/15/2008   Qualifier: Diagnosis of  By: Redmond Pulling  MD, Mateo Flow    . Irritable bowel syndrome 08/06/2009   Qualifier: Diagnosis of  By: Criss Alvine    . NICOTINE ADDICTION 07/06/2008   Started on Chantix.    . 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 Spontaneous abortion    2      ROS:  Review of Systems  Constitutional: Positive for weight loss. Negative for chills and fever.  HENT: Negative for sore throat.   Respiratory: Negative for sputum production.   Cardiovascular: Negative for chest pain.  Gastrointestinal: Positive for constipation, nausea and vomiting. Negative for abdominal pain.  Psychiatric/Behavioral: Positive for depression and memory loss.     Assessment / Plan / Recommendations:   Please see A&P under problem oriented charting for assessment of the patient's acute and chronic medical conditions.   As  always, pt is advised that if symptoms worsen or new symptoms arise, they should go to an urgent care facility or to to ER for further evaluation.   Consent and Medical Decision Making:   Patient seen with Dr. Jimmye Norman  This is a telephone encounter between [facility] and [name] on 12/27/2019 for Poor appetite . The visit was conducted with the patient located at home and [name] at [facility]. The patient's identity was confirmed using their DOB and current address. The patient has consented to being evaluated through a telephone encounter and understands the associated risks (an examination cannot be done and the patient may need to come in for an appointment) / benefits (allows the patient to remain at home, decreasing exposure to coronavirus). I personally spent 21 minutes on medical discussion.

## 2019-12-29 ENCOUNTER — Ambulatory Visit (INDEPENDENT_AMBULATORY_CARE_PROVIDER_SITE_OTHER): Payer: Medicaid Other | Admitting: Student

## 2019-12-29 ENCOUNTER — Encounter: Payer: Self-pay | Admitting: Student

## 2019-12-29 VITALS — BP 108/85 | HR 100 | Temp 98.3°F | Ht 64.0 in | Wt 108.9 lb

## 2019-12-29 DIAGNOSIS — E559 Vitamin D deficiency, unspecified: Secondary | ICD-10-CM | POA: Diagnosis not present

## 2019-12-29 DIAGNOSIS — E876 Hypokalemia: Secondary | ICD-10-CM | POA: Diagnosis not present

## 2019-12-29 DIAGNOSIS — F330 Major depressive disorder, recurrent, mild: Secondary | ICD-10-CM

## 2019-12-29 DIAGNOSIS — R630 Anorexia: Secondary | ICD-10-CM

## 2019-12-29 MED ORDER — MIRTAZAPINE 15 MG PO TABS: 15.0000 mg | ORAL_TABLET | Freq: Every day | ORAL | 2 refills | Status: DC

## 2019-12-29 NOTE — Progress Notes (Signed)
CC: Poor appetite evaluation  HPI:  [name] is a 54 y.o. with past medical history of depression, goiter, who presents to the clinic for poor appetite evaluation.  Please see problem based charting for further detail  Past Medical History:  Diagnosis Date  . ALLERGIC RHINITIS 10/10/2009   Qualifier: Diagnosis of  By: Guy Sandifer DO, Darrick Penna    . CANNABIS ABUSE 04/17/2008   Qualifier: Diagnosis of  By: Redmond Pulling  MD, Mateo Flow    . Chronic abdominal pain    Chronic Abd distension / bloating. W/U includes CT ABD & pelvis 7/08 negative. EGD 5/09 Dr Penelope Coop :erythematous gastric mucosa and bx c/w chronic gastritis - was neg for H pylori. Duo polyp had path c/w peptic duodenitis  . Depression   . DEPRESSION 08/05/2006   Qualifier: Diagnosis of  By: Redmond Pulling  MD, Mateo Flow    . Depression   . GERD 08/05/2006   Qualifier: Diagnosis of  By: Redmond Pulling  MD, Mateo Flow    . Goiter, unspecified 10/16/2009   Qualifier: Diagnosis of  By: Guy Sandifer DO, Darrick Penna    . Hemorrhoids 01/18/2012  . Hernia of abdominal cavity   . INSOMNIA 10/15/2008   Qualifier: Diagnosis of  By: Redmond Pulling  MD, Mateo Flow    . Irritable bowel syndrome 08/06/2009   Qualifier: Diagnosis of  By: Criss Alvine    . NICOTINE ADDICTION 07/06/2008   Started on Chantix.    . 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 Spontaneous abortion    2   Review of Systems:  Review of Systems  Constitutional: Positive for weight loss. Negative for chills and fever.       Night sweats  HENT: Negative for sore throat.   Respiratory: Negative for cough and shortness of breath.   Cardiovascular: Negative for chest pain.  Gastrointestinal: Positive for constipation. Negative for abdominal pain.  Genitourinary: Positive for frequency. Negative for dysuria.  Musculoskeletal: Negative for myalgias.  Neurological: Positive for weakness.  Psychiatric/Behavioral: Positive for memory loss. Negative for suicidal ideas. The  patient has insomnia.     Physical Exam:  Vitals:   12/29/19 1003  BP: 108/85  Pulse: 100  Temp: 98.3 F (36.8 C)  TempSrc: Oral  SpO2: 100%  Weight: 108 lb 14.4 oz (49.4 kg)  Height: 5\' 4"  (1.626 m)   Physical Exam Constitutional:      Appearance: She is ill-appearing.     Comments: Cachectic  Eyes:     General:        Right eye: No discharge.     Comments: Left prosthetic eye  Neck:     Comments: Negative for cervical and supra clavicular lymphadenopathy Cardiovascular:     Rate and Rhythm: Normal rate and regular rhythm.     Heart sounds: Normal heart sounds.  Pulmonary:     Effort: No respiratory distress.  Abdominal:     General: Bowel sounds are normal. There is no distension.     Tenderness: There is no abdominal tenderness. There is no guarding.  Musculoskeletal:        General: Normal range of motion.     Cervical back: Normal range of motion.  Skin:    General: Skin is warm.  Neurological:     General: No focal deficit present.     Mental Status: She is alert.  Psychiatric:     Comments: Flat affect     Assessment & Plan:   See Encounters Tab for problem based charting.  Patient seen with Dr. Jimmye Norman

## 2019-12-29 NOTE — Assessment & Plan Note (Addendum)
Patient reports ongoing poor appetite for 2 weeks.  States that she did not feel like eating food.  Her weight dropped from 123 to 108 pounds since last visit.  Patient states that she has had similar problem years ago which she was given a liquid medication which helped.  Patient could not remember who prescribed and what medication was prescribed.  She denies eating disorder in the past.  General: Denies fever, chills, sick contact.  Endorses night sweats HEENT: No cervical or supraclavicular adenopath, no dysphagia.  Had tooth removal surgery 3 weeks ago, denies dental pain or pain with chewing.  Prefer to chew on the left side of her mouth. Cardio: No chest pain Pulmonary: No shortness of breath cough or sore throat GI: No abdominal pain, diarrhea.  Endorses constipation, last bowel movement 2 days ago, does not remember the color of her stool. GU: Endorses frequency but no dysuria MSK: No body aches Psych: Endorses mood changes, poor sleep and concentration, feels more agitated, states that memory has worsened  Patient denies alcohol use. Quit smoking 2 months ago, used to smoke 1 pack a day on and off for 1 year.  Smoked marijuana on Thanksgiving.  Denies IV drug use  Assessment and plan There are broad differentials for poor appetite.  Differentials include cancer, infectious cause or cognitive cause.  Infectious cause: Patient had sweating with weight loss.  Even though she denies fever, there is suspicion for endocarditis with recent dental procedure.  Will obtain blood culture.  If positive can proceed with echocardiogram.    Cognitive cause: Patient had a MoCA score of 17, which is unusual for patient of her age.  Her cognitive decline can contribute to her poor appetite and declining function.  Can consider obtaining brain MRI after rile out other causes.   Cancer: She has 2/3 of B symptoms of night sweats and weight loss.  Patient is up-to-date with cancer screening of colonoscopy,  mammogram and Pap smear.  She has no cervical or supraclavicular lymphadenopathy.    Will obtain CBC with differential, CMP, B12, folate, vitamin D, hep C, HIV and blood culture today.  Change her mirtazapine to 15 mg for appetite stimulation.  Patient is encouraged to follow-up closely.  Addendum CBC shows normal WBC and hemoglobin with macrocytosis.  CMP shows hypokalemia 2.6, hypochloremic with metabolic alkalosis.  B12 and folate unremarkable.  HCV and HIV negative.  Vitamin D significantly low at 7.8.  Her low p.o. intake cannot alone because this severity of hypokalemia.  We will also check magnesium and repeat BMP next visit.  At this time, will replete with KCl 20 mEq daily for 6 days.  Her low vitamin D level can also affect her mental health.  Will replete vitamin D with D3 50,000 units weekly for 2 months.  Recheck vitamin D level in 3 months.  I called and spoke to the patient this morning.  I informed her of her lab values and sent prescription of potassium and vitamin D supplement to her pharmacy.  Patient states that she could not remember what she ate yesterday.  States that she drank 4 bottles of Ensure yesterday.  Denies any herbal supplements, or alcohol use.  She quit smoking and denies any recreational drug use.  Patient denies diarrhea or vomiting.  Endorses frequency's but no dysuria or polydipsia.  I also talked to her son and her friend Christa See on the phone.  They noticed her memory decline since last year.  Patient usually  goes to the grocery store and cook for herself.  She has no issue with food access.  Recently patient has been cooking less because of her poor appetite.  They state that patient can sometimes forget to eat.  She drinks Ensure for supplement and eating some snacks daily.  The also concerned about her behavioral health medications.  Overall, this seems to be low caloric intake weight loss and cognitive impairment is a big factor.  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  We will continue to rule out other causes of her poor appetite.  Ultimately patient will need a brain MRI to evaluate for the cause of her early onset dementia.  Will also repeat a Moca score after appropriately replete her vitamin D. After speaking with her behavioral health provider at [facility], we agree on d/c Hydroxyzine for reduce anticholinergic effect. Per their chart, patient is also on Benztropine 0.5 mg however patient reports not having the medication.  Plan: - KCl 20 mEq daily x 6 days - Vit D3 50000 units weekly for 2 months. Repeat Vitamin D level in 3 months. Consider repeat MOCA score after her Vit D level appropriately normalize.  - Repeat BMP and check Mag next visit.

## 2019-12-29 NOTE — Patient Instructions (Signed)
Stephanie Padilla,  I'm sorry that you not feeling well.  We will obtain some blood work today to find out to narrow down the causes of your poor appetite.  I sent a prescription of mirtazapine 15 mg to your pharmacy.  Please start taking 1 tablet daily at bedtime.  Please call the office if you have any questions or concern.  Take care  Dr. Alfonse Spruce

## 2019-12-30 LAB — CMP14 + ANION GAP
ALT: 11 IU/L (ref 0–32)
AST: 19 IU/L (ref 0–40)
Albumin/Globulin Ratio: 1.8 (ref 1.2–2.2)
Albumin: 4.2 g/dL (ref 3.8–4.9)
Alkaline Phosphatase: 106 IU/L (ref 44–121)
Anion Gap: 19 mmol/L — ABNORMAL HIGH (ref 10.0–18.0)
BUN/Creatinine Ratio: 12 (ref 9–23)
BUN: 6 mg/dL (ref 6–24)
Bilirubin Total: 0.4 mg/dL (ref 0.0–1.2)
CO2: 32 mmol/L — ABNORMAL HIGH (ref 20–29)
Calcium: 9.6 mg/dL (ref 8.7–10.2)
Chloride: 89 mmol/L — ABNORMAL LOW (ref 96–106)
Creatinine, Ser: 0.52 mg/dL — ABNORMAL LOW (ref 0.57–1.00)
GFR calc Af Amer: 127 mL/min/{1.73_m2} (ref >59)
GFR calc non Af Amer: 110 mL/min/{1.73_m2} (ref >59)
Globulin, Total: 2.3 g/dL (ref 1.5–4.5)
Glucose: 96 mg/dL (ref 65–99)
Potassium: 2.6 mmol/L — ABNORMAL LOW (ref 3.5–5.2)
Sodium: 140 mmol/L (ref 134–144)
Total Protein: 6.5 g/dL (ref 6.0–8.5)

## 2019-12-30 LAB — VITAMIN D 25 HYDROXY (VIT D DEFICIENCY, FRACTURES): Vit D, 25-Hydroxy: 7.8 ng/mL — ABNORMAL LOW (ref 30.0–100.0)

## 2019-12-30 LAB — HIV ANTIBODY (ROUTINE TESTING W REFLEX): HIV Screen 4th Generation wRfx: NONREACTIVE

## 2019-12-30 LAB — HEPATITIS C ANTIBODY: Hep C Virus Ab: <0.1 s/co ratio (ref 0.0–0.9)

## 2019-12-30 LAB — VITAMIN B12: Vitamin B-12: 625 pg/mL (ref 232–1245)

## 2019-12-31 LAB — CBC WITH DIFFERENTIAL/PLATELET
Basophils Absolute: 0 10*3/uL (ref 0.0–0.2)
Basos: 0 %
EOS (ABSOLUTE): 0 10*3/uL (ref 0.0–0.4)
Eos: 0 %
Hematocrit: 44.9 % (ref 34.0–46.6)
Hemoglobin: 15.9 g/dL (ref 11.1–15.9)
Immature Grans (Abs): 0 10*3/uL (ref 0.0–0.1)
Immature Granulocytes: 0 %
Lymphocytes Absolute: 1.8 10*3/uL (ref 0.7–3.1)
Lymphs: 35 %
MCH: 36.4 pg — ABNORMAL HIGH (ref 26.6–33.0)
MCHC: 35.4 g/dL (ref 31.5–35.7)
MCV: 103 fL — ABNORMAL HIGH (ref 79–97)
Monocytes Absolute: 0.4 10*3/uL (ref 0.1–0.9)
Monocytes: 8 %
Neutrophils Absolute: 2.9 10*3/uL (ref 1.4–7.0)
Neutrophils: 57 %
Platelets: 407 10*3/uL (ref 150–450)
RBC: 4.37 x10E6/uL (ref 3.77–5.28)
RDW: 16.8 % — ABNORMAL HIGH (ref 11.7–15.4)
WBC: 5.1 10*3/uL (ref 3.4–10.8)

## 2019-12-31 LAB — FOLATE RBC
Folate, Hemolysate: 189 ng/mL
Folate, RBC: 421 ng/mL — ABNORMAL LOW (ref >498)

## 2020-01-01 MED ORDER — POTASSIUM CHLORIDE CRYS ER 20 MEQ PO TBCR: 20.0000 meq | EXTENDED_RELEASE_TABLET | Freq: Every day | ORAL | 0 refills | Status: DC

## 2020-01-01 MED ORDER — VITAMIN D3 1.25 MG (50000 UT) PO CAPS: 50000.0000 [IU] | ORAL_CAPSULE | ORAL | 0 refills | Status: DC

## 2020-01-01 NOTE — Progress Notes (Signed)
Internal Medicine Clinic Attending  I saw and evaluated the patient.  I personally confirmed the key portions of the history and exam documented by Dr. Alfonse Spruce and I reviewed pertinent patient test results.  The assessment, diagnosis, and plan were formulated together and I agree with the documentation in the resident's note.  This is a very sarcopenic woman with unexpected and concerning (particularly at middle age) finding of cognitive impairment as assessed today with MoCA.  Differential for unintended weight loss is broad, but cognitive impairment may be a key clue.  I agree with Dr. Lamont Snowball plan for evaluation.  Close f/u needed.

## 2020-01-01 NOTE — Addendum Note (Signed)
Addended byGaylan Gerold on: 01/01/2020 11:17 AM   Modules accepted: Orders

## 2020-01-01 NOTE — Addendum Note (Signed)
Addended byGaylan Gerold on: 01/01/2020 12:06 PM   Modules accepted: Orders

## 2020-01-02 ENCOUNTER — Other Ambulatory Visit: Payer: Self-pay | Admitting: Internal Medicine

## 2020-01-02 DIAGNOSIS — R630 Anorexia: Secondary | ICD-10-CM

## 2020-01-02 DIAGNOSIS — E559 Vitamin D deficiency, unspecified: Secondary | ICD-10-CM

## 2020-01-02 DIAGNOSIS — E876 Hypokalemia: Secondary | ICD-10-CM

## 2020-01-02 MED ORDER — POTASSIUM CHLORIDE CRYS ER 20 MEQ PO TBCR: 20.0000 meq | EXTENDED_RELEASE_TABLET | Freq: Every day | ORAL | 0 refills | Status: DC

## 2020-01-02 MED ORDER — VITAMIN D3 1.25 MG (50000 UT) PO CAPS: 50000.0000 [IU] | ORAL_CAPSULE | ORAL | 0 refills | Status: AC

## 2020-01-02 MED ORDER — MIRTAZAPINE 15 MG PO TABS: 15.0000 mg | ORAL_TABLET | Freq: Every day | ORAL | 2 refills | Status: DC

## 2020-01-02 MED FILL — POTASSIUM CHLORIDE CRYS ER: 20 | 6 days supply | Qty: 6 | Fill #0

## 2020-01-02 MED FILL — MIRTAZAPINE 15 MG TABLET: 15 | 30 days supply | Qty: 30 | Fill #0

## 2020-01-04 LAB — CULTURE, BLOOD (SINGLE)

## 2020-01-05 ENCOUNTER — Encounter: Payer: Self-pay | Admitting: Dietician

## 2020-01-08 ENCOUNTER — Other Ambulatory Visit: Payer: Self-pay

## 2020-01-08 ENCOUNTER — Ambulatory Visit: Payer: Medicaid Other | Admitting: Student

## 2020-01-08 ENCOUNTER — Encounter: Payer: Self-pay | Admitting: Student

## 2020-01-08 VITALS — BP 101/81 | HR 97 | Temp 98.4°F | Ht 64.0 in | Wt 110.7 lb

## 2020-01-08 DIAGNOSIS — G309 Alzheimer's disease, unspecified: Secondary | ICD-10-CM

## 2020-01-08 DIAGNOSIS — E876 Hypokalemia: Secondary | ICD-10-CM | POA: Insufficient documentation

## 2020-01-08 DIAGNOSIS — R630 Anorexia: Secondary | ICD-10-CM

## 2020-01-08 NOTE — Assessment & Plan Note (Addendum)
CMP shows potassium of 2.6 with metabolic alkalosis.  Patient denies recent vomiting or diarrhea.  She also denied diuretic use or mixed up family members prescriptions drug.    Assessment and plan CMP shows hypokalemia with metabolic alkalosis.  Unsure of the underlying cause.  Low suspicion for hyperaldosteronism given low-normotensive.  No reported GI losses with diarrhea or vomiting.  Patient is not taking a diuretic and denies mixing up with family members prescriptions.  She also denies taking any herbal supplements.  We will recheck BMP and magnesium today. -Check BMP -Check Mag -Continue to replete if low potassium.  Addendum Repeat BMP shows improved potassium level 3.4.  Magnesium level is within normal limits.  Metabolic alkalosis also resolved.  Patient took her last dose of Kdur 20 mEq yesterday.  We will repeat BMP when patient come back in 2 weeks.

## 2020-01-08 NOTE — Progress Notes (Signed)
   CC: Poor appetite follow up  HPI:  [name] is a 54 y.o. with hospital history of depression who presents to the clinic for follow-up of for poor appetite work-up.  Please see problem based charting for further detail  Past Medical History:  Diagnosis Date  . ALLERGIC RHINITIS 10/10/2009   Qualifier: Diagnosis of  By: Guy Sandifer DO, Darrick Penna    . CANNABIS ABUSE 04/17/2008   Qualifier: Diagnosis of  By: Redmond Pulling  MD, Mateo Flow    . Chronic abdominal pain    Chronic Abd distension / bloating. W/U includes CT ABD & pelvis 7/08 negative. EGD 5/09 Dr Penelope Coop :erythematous gastric mucosa and bx c/w chronic gastritis - was neg for H pylori. Duo polyp had path c/w peptic duodenitis  . Depression   . DEPRESSION 08/05/2006   Qualifier: Diagnosis of  By: Redmond Pulling  MD, Mateo Flow    . Depression   . GERD 08/05/2006   Qualifier: Diagnosis of  By: Redmond Pulling  MD, Mateo Flow    . Goiter, unspecified 10/16/2009   Qualifier: Diagnosis of  By: Guy Sandifer DO, Darrick Penna    . Hemorrhoids 01/18/2012  . Hernia of abdominal cavity   . INSOMNIA 10/15/2008   Qualifier: Diagnosis of  By: Redmond Pulling  MD, Mateo Flow    . Irritable bowel syndrome 08/06/2009   Qualifier: Diagnosis of  By: Criss Alvine    . NICOTINE ADDICTION 07/06/2008   Started on Chantix.    . 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 Spontaneous abortion    2   Review of Systems: Positive for constipation, poor concentration.  Negative for vomiting, diarrhea, or suicidal thoughts.  Physical Exam:  Vitals:   01/08/20 0948  Weight: 110 lb 11.2 oz (50.2 kg)  Height: 5\' 4"  (1.626 m)   Physical Exam Constitutional:      General: She is not in acute distress. HENT:     Head: Normocephalic.  Eyes:     Comments: Left prosthetic eye  Neck:     Comments: No goiter or enlargement palpated of neck Cardiovascular:     Rate and Rhythm: Normal rate and regular rhythm.  Pulmonary:     Effort: Pulmonary effort is normal.  No respiratory distress.  Abdominal:     General: Bowel sounds are normal.     Tenderness: There is no abdominal tenderness.  Musculoskeletal:     Cervical back: No rigidity.     Right lower leg: No edema.     Left lower leg: No edema.  Skin:    General: Skin is warm.  Neurological:     Mental Status: She is alert.  Psychiatric:     Comments: Flat affect     Assessment & Plan:   See Encounters Tab for problem based charting.  Patient seen with Dr. Heber [place]

## 2020-01-08 NOTE — Patient Instructions (Signed)
Ms. Fregeau,  I am glad that you has gained 2 pounds since her last visit.  Please continue taking all your medications as instructed.  I will obtain blood work today to check for your potassium level.  I will also obtain a brain MRI to evaluate for your memory decline.  Please come back in 2 weeks for follow-up.  Take care  Dr. Alfonse Spruce

## 2020-01-08 NOTE — Assessment & Plan Note (Signed)
Patient presents to clinic for poor appetite follow-up.  Patient denies any significant change in her appetite since last visit however she gained 2 pounds.  She could not remember what she ate yesterday and reports drinking 3-4 bottles of Ensure daily.  States that her mood is stable however her concentration is poor.  She denies any suicidal thoughts.  Assessment and plan Infectious cause was ruled out with negative blood culture.   Cancer related causes is lower on the differential list.  Patient is up-to-date with colonoscopy, mammogram and Pap smear.  The next step will be obtaining goiter ultrasound. Her cognitive decline may be a key for her problem.  Her last MoCA score was 17.  Her son states that her memory has been declining since last year and report that patient sometimes forgets to eat.  This seems to be a low caloric intake weight loss.  Differentials for her early onset dementia include frontal temporal dementia, early onset Alzheimer, brain tumor or depression related.  Will obtain brain MRI for evaluation.  Repleted vitamin D and will recheck MoCA when vitamin D level is appropriately repleted. -Brain MRI -Continue mirtazapine 15 mg at bedtime -Referral to [facility] for high caloric food education -Follow-up in 2 weeks

## 2020-01-09 LAB — BMP8+ANION GAP
Anion Gap: 17 mmol/L (ref 10.0–18.0)
BUN/Creatinine Ratio: 12 (ref 9–23)
BUN: 5 mg/dL — ABNORMAL LOW (ref 6–24)
CO2: 27 mmol/L (ref 20–29)
Calcium: 9.4 mg/dL (ref 8.7–10.2)
Chloride: 100 mmol/L (ref 96–106)
Creatinine, Ser: 0.43 mg/dL — ABNORMAL LOW (ref 0.57–1.00)
GFR calc Af Amer: 135 mL/min/{1.73_m2} (ref >59)
GFR calc non Af Amer: 117 mL/min/{1.73_m2} (ref >59)
Glucose: 84 mg/dL (ref 65–99)
Potassium: 3.4 mmol/L — ABNORMAL LOW (ref 3.5–5.2)
Sodium: 144 mmol/L (ref 134–144)

## 2020-01-09 LAB — MAGNESIUM: Magnesium: 1.9 mg/dL (ref 1.6–2.3)

## 2020-01-09 NOTE — Progress Notes (Signed)
Internal Medicine Clinic Attending  I saw and evaluated the patient.  I personally confirmed the key portions of the history and exam documented by Dr. Nguyen and I reviewed pertinent patient test results.  The assessment, diagnosis, and plan were formulated together and I agree with the documentation in the resident's note.\  

## 2020-01-22 ENCOUNTER — Encounter: Payer: Medicaid Other | Admitting: Internal Medicine

## 2020-01-30 ENCOUNTER — Ambulatory Visit (HOSPITAL_COMMUNITY)
Admission: RE | Admit: 2020-01-30 | Discharge: 2020-01-30 | Disposition: A | Discharge status: Home / Self Care | Payer: Medicaid Other | Source: Ambulatory Visit | Attending: Internal Medicine | Admitting: Internal Medicine

## 2020-01-30 ENCOUNTER — Ambulatory Visit: Payer: Medicaid Other | Admitting: Student

## 2020-01-30 ENCOUNTER — Other Ambulatory Visit: Payer: Self-pay

## 2020-01-30 VITALS — BP 130/91 | HR 101 | Temp 98.2°F | Ht 64.0 in | Wt 104.4 lb

## 2020-01-30 DIAGNOSIS — E876 Hypokalemia: Secondary | ICD-10-CM | POA: Diagnosis not present

## 2020-01-30 DIAGNOSIS — R413 Other amnesia: Secondary | ICD-10-CM

## 2020-01-30 DIAGNOSIS — R000 Tachycardia, unspecified: Secondary | ICD-10-CM

## 2020-01-30 DIAGNOSIS — F515 Nightmare disorder: Secondary | ICD-10-CM

## 2020-01-30 DIAGNOSIS — K219 Gastro-esophageal reflux disease without esophagitis: Secondary | ICD-10-CM | POA: Diagnosis not present

## 2020-01-30 DIAGNOSIS — R1312 Dysphagia, oropharyngeal phase: Secondary | ICD-10-CM

## 2020-01-30 DIAGNOSIS — I10 Essential (primary) hypertension: Secondary | ICD-10-CM

## 2020-01-30 DIAGNOSIS — E873 Alkalosis: Secondary | ICD-10-CM | POA: Diagnosis not present

## 2020-01-30 DIAGNOSIS — F339 Major depressive disorder, recurrent, unspecified: Secondary | ICD-10-CM

## 2020-01-30 DIAGNOSIS — R634 Abnormal weight loss: Secondary | ICD-10-CM | POA: Diagnosis present

## 2020-01-30 DIAGNOSIS — Z87891 Personal history of nicotine dependence: Secondary | ICD-10-CM | POA: Diagnosis not present

## 2020-01-30 DIAGNOSIS — E559 Vitamin D deficiency, unspecified: Secondary | ICD-10-CM | POA: Diagnosis not present

## 2020-01-30 DIAGNOSIS — E049 Nontoxic goiter, unspecified: Secondary | ICD-10-CM

## 2020-01-30 LAB — URINALYSIS, ROUTINE W REFLEX MICROSCOPIC
Glucose, UA: NEGATIVE mg/dL
Hgb urine dipstick: NEGATIVE
Ketones, ur: NEGATIVE mg/dL
Nitrite: NEGATIVE
Protein, ur: 100 mg/dL — AB
Specific Gravity, Urine: 1.024 (ref 1.005–1.030)
pH: 5 (ref 5.0–8.0)

## 2020-01-30 LAB — NA AND K (SODIUM & POTASSIUM), RAND UR
Potassium Urine: 79 mmol/L
Sodium, Ur: 30 mmol/L

## 2020-01-30 NOTE — Patient Instructions (Addendum)
Stephanie Padilla,   I am sorry to hear that you are continuing to have difficulties eating and losing weight. Today, we will check additional blood work. I have also ordered a chest x-ray and referred you to speech therapy for evaluation of your swallowing.   You should receive calls from speech therapy and radiologist for scheduling soon.   You also have an MRI of your brain scheduled for 02/08/20. It is very important that you arrive on time to your appointment to see if there are any other reasons you may be having weight loss.   Please be sure to follow up with your [facility] appointment as scheduled.   I will call you with results when they are available.   Please schedule a follow up appointment to see me in [facility] clinic in 2 weeks from today or sooner if you experience any additional concerns such as fever, vomiting, abdominal pain new numbness or tingling, or any other concerns.   [phone].  Thank you and take care,  Dr. Konrad Penta    Dysphagia Eating Plan, Bite Size Food This eating plan is for people with moderate swallowing problems who have transitioned from pureed and minced foods. Bite size foods are soft and cut into small chunks so that they can be swallowed safely. On this eating plan, you may be instructed to drink liquids that are thickened. Work with your health care provider and your diet and nutrition specialist (dietitian) to make sure that you are following the eating plan safely and getting all the nutrients you need. What are tips for following this plan? General information  You may eat foods that are tender, soft, and moist.  Always test food texture before taking a bite. Poke food with a fork or spoon to make sure it is tender. The test sample should squash, break apart, or change shape, and it should not return to its original shape when the fork or spoon is removed.  Food should be easy to cut and chew. Avoid large pieces of food that require a lot of  chewing.  Take small bites. Each bite should be smaller than your thumb nail (about 15 mm by 15 mm).  If you were on a pureed or minced food eating plan, you may eat any of the foods included in those diets.  Avoid foods that are very dry, hard, sticky, chewy, coarse, or crunchy.  If instructed by your health care provider, thicken liquids. Follow your health care provider's instructions for what products to use, how to do this, and to what thickness. ? Your health care provider may recommend using a commercial thickener, rice cereal, or potato flakes. Ask your health care provider to recommend thickeners. ? Thickened liquids are usually a "pudding-like" consistency, or they may be as thick as honey or thick enough to eat with a spoon.   Cooking  To moisten foods, you may add liquids while you are blending, mashing, or grinding your foods to the right consistency. These liquids include gravies, sauces, vegetable or fruit juice, milk, half and half, or water.  Strain extra liquid from foods before eating.  Reheat foods slowly to prevent a tough crust from forming.  Prepare foods in advance. Meal planning  Eat a variety of foods to get all the nutrients you need.  Some foods may be tolerated better than others. Work with your dietitian to identify which foods are safest for you to eat.  Follow your meal plan as told by your dietitian. What  foods can I eat? Grains Moist breads without nuts or seeds. Biscuits, muffins, pancakes, and waffles that are well-moistened with syrup, jelly, margarine, or butter. Cooked cereals. Moist bread stuffing. Moist rice. Well-moistened cold cereal with small chunks. Well-cooked pasta, noodles, rice, and bread dressing in small pieces and thick sauce. Soft dumplings or spaetzle in small pieces and butter or gravy. Fruits Canned or cooked fruits that are soft or moist and do not have skin or seeds. Fresh, soft bananas. Thickened fruit  juices. Vegetables Soft, well-cooked vegetables in small pieces. Soft-cooked, mashed potatoes. Thickened vegetable juice. Meats and other proteins Tender, moist meats or poultry in small pieces. Moist meatballs or meatloaf. Fish without bones. Eggs or egg substitutes in small pieces. Tofu. Tempeh and meat alternatives in small pieces. Well-cooked, tender beans, peas, baked beans, and other legumes. Dairy Thickened milk. Cream cheese. Yogurt. Cottage cheese. Sour cream. Small pieces of soft cheese. Fats and oils Butter. Oils. Margarine. Mayonnaise. Gravy. Spreads. Sweets and desserts Soft, smooth, moist desserts. Pudding. Custard. Moist cakes. Jam. Jelly. Honey. Preserves. Ask your health care provider whether you can have frozen desserts. Seasonings and other foods All seasonings and sweeteners. All sauces with small chunks. Prepared tuna, egg, or chicken salad without raw fruits or vegetables. Moist casseroles with small, tender pieces of meat. Soups with tender meat. The items listed above may not be a complete list of foods and beverages you can eat. Contact a dietitian for more information. What foods must I avoid? Grains Coarse or dry cereals. Dry breads. Toast. Crackers. Tough, crusty breads, such as [place] bread and baguettes. Dry pancakes, waffles, and muffins. Sticky rice. Dry bread stuffing. Granola. Popcorn. Chips. Fruits Hard, crunchy, stringy, high-pulp, and juicy raw fruits such as apples, pineapple, papaya, and watermelon. Small, round fruits, such as grapes. Dried fruit and fruit leather. Vegetables All raw vegetables. Cooked corn. Rubbery or stiff cooked vegetables. Stringy vegetables, such as celery. Tough, crisp fried potatoes. Potato skins. Meats and other proteins Large pieces of meat. Dry, tough meats, such as bacon, sausage, and hot dogs. Chicken, [place], or fish with skin and bones. Crunchy peanut butter. Nuts. Seeds. Nut and seed butters. Dairy Yogurt with nuts,  seeds, or large chunks. Large chunks of cheese. Sweets and desserts Dry cakes. Chewy or dry cookies. Any desserts with nuts, seeds, dry fruits, coconut, pineapple, or anything dry, sticky, or hard. Chewy caramel. Licorice. Taffy-type candies. Ask your health care provider whether you can have frozen desserts. Seasonings and other foods Soups with tough or large chunks of meats, poultry, or vegetables. Corn or clam chowder. Smoothies with large chunks of fruit. The items listed above may not be a complete list of foods and beverages you should avoid. Contact a dietitian for more information. Summary  Bite size foods can be helpful for people with moderate swallowing problems.  On this dysphagia eating plan, you may eat foods that are soft, moist, and cut into pieces smaller than your thumb nail (about 15 mm by 15 mm).  You may be instructed to thicken liquids. Follow your health care provider's instructions about how to do this and to what consistency. This information is not intended to replace advice given to you by your health care provider. Make sure you discuss any questions you have with your health care provider. Document Revised: 09/25/2019 Document Reviewed: 09/25/2019 Elsevier Patient Education  2021 [employer].

## 2020-01-31 ENCOUNTER — Other Ambulatory Visit: Payer: Self-pay | Admitting: Student

## 2020-01-31 DIAGNOSIS — E559 Vitamin D deficiency, unspecified: Secondary | ICD-10-CM | POA: Insufficient documentation

## 2020-01-31 DIAGNOSIS — I10 Essential (primary) hypertension: Secondary | ICD-10-CM | POA: Insufficient documentation

## 2020-01-31 DIAGNOSIS — R413 Other amnesia: Secondary | ICD-10-CM | POA: Insufficient documentation

## 2020-01-31 DIAGNOSIS — R000 Tachycardia, unspecified: Secondary | ICD-10-CM | POA: Insufficient documentation

## 2020-01-31 DIAGNOSIS — R1310 Dysphagia, unspecified: Secondary | ICD-10-CM | POA: Insufficient documentation

## 2020-01-31 LAB — CBC WITH DIFFERENTIAL/PLATELET
Basophils Absolute: 0 10*3/uL (ref 0.0–0.2)
Basos: 0 %
EOS (ABSOLUTE): 0 10*3/uL (ref 0.0–0.4)
Eos: 0 %
Hematocrit: 41.5 % (ref 34.0–46.6)
Hemoglobin: 14.6 g/dL (ref 11.1–15.9)
Immature Grans (Abs): 0 10*3/uL (ref 0.0–0.1)
Immature Granulocytes: 0 %
Lymphocytes Absolute: 1.8 10*3/uL (ref 0.7–3.1)
Lymphs: 39 %
MCH: 35.6 pg — ABNORMAL HIGH (ref 26.6–33.0)
MCHC: 35.2 g/dL (ref 31.5–35.7)
MCV: 101 fL — ABNORMAL HIGH (ref 79–97)
Monocytes Absolute: 0.3 10*3/uL (ref 0.1–0.9)
Monocytes: 5 %
Neutrophils Absolute: 2.5 10*3/uL (ref 1.4–7.0)
Neutrophils: 56 %
Platelets: 368 10*3/uL (ref 150–450)
RBC: 4.1 x10E6/uL (ref 3.77–5.28)
RDW: 15.3 % (ref 11.7–15.4)
WBC: 4.6 10*3/uL (ref 3.4–10.8)

## 2020-01-31 LAB — CMP14 + ANION GAP
ALT: 11 IU/L (ref 0–32)
AST: 20 IU/L (ref 0–40)
Albumin/Globulin Ratio: 1.8 (ref 1.2–2.2)
Albumin: 3.9 g/dL (ref 3.8–4.9)
Alkaline Phosphatase: 109 IU/L (ref 44–121)
Anion Gap: 17 mmol/L (ref 10.0–18.0)
BUN/Creatinine Ratio: 7 — ABNORMAL LOW (ref 9–23)
BUN: 4 mg/dL — ABNORMAL LOW (ref 6–24)
Bilirubin Total: 0.4 mg/dL (ref 0.0–1.2)
CO2: 28 mmol/L (ref 20–29)
Calcium: 9.4 mg/dL (ref 8.7–10.2)
Chloride: 100 mmol/L (ref 96–106)
Creatinine, Ser: 0.56 mg/dL — ABNORMAL LOW (ref 0.57–1.00)
GFR calc Af Amer: 124 mL/min/{1.73_m2} (ref >59)
GFR calc non Af Amer: 108 mL/min/{1.73_m2} (ref >59)
Globulin, Total: 2.2 g/dL (ref 1.5–4.5)
Glucose: 94 mg/dL (ref 65–99)
Potassium: 3.1 mmol/L — ABNORMAL LOW (ref 3.5–5.2)
Sodium: 145 mmol/L — ABNORMAL HIGH (ref 134–144)
Total Protein: 6.1 g/dL (ref 6.0–8.5)

## 2020-01-31 LAB — SEDIMENTATION RATE: Sed Rate: 3 mm/hr (ref 0–40)

## 2020-01-31 LAB — C-REACTIVE PROTEIN: CRP: 1 mg/L (ref 0–10)

## 2020-01-31 LAB — T3, FREE: T3, Free: 2.9 pg/mL (ref 2.0–4.4)

## 2020-01-31 LAB — T4, FREE: Free T4: 0.96 ng/dL (ref 0.82–1.77)

## 2020-01-31 LAB — LACTATE DEHYDROGENASE: LDH: 206 IU/L (ref 119–226)

## 2020-01-31 LAB — TSH: TSH: 1.22 u[IU]/mL (ref 0.450–4.500)

## 2020-01-31 NOTE — Assessment & Plan Note (Signed)
Patient currently endorses what sounds like oropharyngeal dysphagia and denies globus sensation or sensation of food getting stuck in esophagus, nausea, vomiting, abdominal pain, although has not been eating any solid food over the past week or so. She has lost >20lbs over the last 2 months, which concerns her. She has had a history of similar weight loss in the past with decreased PO intake. She had CT abdomen / pelvis in 2008 which was negative and EGD in 2009 that showed erythematous gastric mucosa consistent with chronic gastritis and duodenal polyp with path consistent with peptic duodenitis. She has since had another EGD in 2018 that was remarkable only for small hiatal hernia. She is not currently on PPI.   - Referral sent for SLP evaluation  - Would likely benefit from repeat EGD / barium swallow - Will consider adding back PPI if swallowing mechanism is intact.

## 2020-01-31 NOTE — Assessment & Plan Note (Addendum)
Patient recently had PHQ9 of 18. 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 She endorses visual and auditory hallucinations and endorses increased anxiety recently and increased agitation. Denies SI or HI. States she follows regularly with a psychiatrist at [facility] and will continue to follow with them.   - Continue lower dose Mirtazapine 15mg  daily for now in hopes of improved appetite - Continue Prozac 20mg  daily - Fluoxetine increased to 40mg  daily  - Continue psychiatry and therapy follow up

## 2020-01-31 NOTE — Assessment & Plan Note (Signed)
BP Readings from Last 3 Encounters:  01/30/20 (!) 130/91  01/08/20 101/81  12/29/19 108/85   Blood pressure more elevated today than on previous visits. Consider pheochromocytoma in setting of new elevation despite weight loss, hypokalemia, and reports of increased anxiety.   - Consider urine metanephrines if pressures continue to be erratic or patient develops other concerning symptoms - For now, continue to monitor without medication

## 2020-01-31 NOTE — Assessment & Plan Note (Signed)
Patient states that she had a history of frequent nightmares, although this has improved recently.   - Continue to monitor

## 2020-01-31 NOTE — Assessment & Plan Note (Signed)
Patient continues to have hypokalemia since last visit although slightly improved. Also has metabolic alkalosis, ruling out metabolic alkalosis. Sodium just slightly elevated at 145. Hyperaldosteronism possible given increase in blood pressure and electrolyte derangements, although most likely in the setting of decreased PO intake. Patient denies history of body dysmorphia or purging / recent vomiting.   - Will replace as needed

## 2020-01-31 NOTE — Progress Notes (Addendum)
CC: Trouble Swallowing   HPI:  Stephanie Padilla is a 55 y.o. lady w/ PMHx notable for MDD with psychosis, nightmare disorder, goiter, GERD w/ chronic gastritis and peptic duodenitis, and TUD, presenting for follow up of weight loss which has contributed, which she attributes to trouble initiating swallowing. Please see problem-based assessment / plan for full details.  Past Medical History:  Diagnosis Date  . ALLERGIC RHINITIS 10/10/2009   Qualifier: Diagnosis of  By: Guy Sandifer DO, Darrick Penna    . CANNABIS ABUSE 04/17/2008   Qualifier: Diagnosis of  By: Redmond Pulling  MD, Mateo Flow    . Chronic abdominal pain    Chronic Abd distension / bloating. W/U includes CT ABD & pelvis 7/08 negative. EGD 5/09 Dr Penelope Coop :erythematous gastric mucosa and bx c/w chronic gastritis - was neg for H pylori. Duo polyp had path c/w peptic duodenitis  . Depression   . DEPRESSION 08/05/2006   Qualifier: Diagnosis of  By: Redmond Pulling  MD, Mateo Flow    . Depression   . GERD 08/05/2006   Qualifier: Diagnosis of  By: Redmond Pulling  MD, Mateo Flow    . Goiter, unspecified 10/16/2009   Qualifier: Diagnosis of  By: Guy Sandifer DO, Darrick Penna    . Hemorrhoids 01/18/2012  . Hernia of abdominal cavity   . INSOMNIA 10/15/2008   Qualifier: Diagnosis of  By: Redmond Pulling  MD, Mateo Flow    . Irritable bowel syndrome 08/06/2009   Qualifier: Diagnosis of  By: Criss Alvine    . NICOTINE ADDICTION 07/06/2008   Started on Chantix.    . 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 Spontaneous abortion    2   Review of Systems:  All others negative except as noted in assessment / plan.  Physical Exam:  Vitals:   01/30/20 0956 01/30/20 1032  BP: (!) 123/100 (!) 130/91  Pulse: (!) 109 (!) 101  Temp: 98.2 F (36.8 C)   TempSrc: Oral   SpO2: 100%   Weight: 104 lb 6.4 oz (47.4 kg)   Height: 5\' 4"  (1.626 m)    General: Patient appears thin and chronically ill, older than stated age. No acute distress.  Eyes: Patient has  false left eye. No eye drainage or conjunctivitis.  HENT: Neck is supple. No nasal discharge. No significant oropharyngeal erythema, swelling, or lesions. No palpable thyromegaly.  Respiratory: Lungs are CTA, bilaterally. No wheezes, rales, or rhonchi.  Cardiovascular: Regular rate and rhythm. No murmurs, rubs, or gallops. No lower extremity edema. Musculoskeletal: There is temporal wasting bilaterally with evidence of cachexia in bilateral lower extremities. No joint tenderness. Slowed gait. Neurological: Patient has impaired short term memory, with intact attention.  Abdominal: Soft and non-tender to palpation. Bowel sounds intact. No rebound or guarding. Skin: No lesions. No rashes. Tissue noted to be in place over bilateral breasts. Psych: Affect is flat. Does not appear to be responding to external stimuli.   Assessment & Plan:   See Encounters Tab for problem based charting.  Addendum 01/31/2018 @12 :00pm:  K+ low at 3.1. Prescribed 25mEq PO potassium until follow up appointment, which needs to be scheduled.   Also made SLP eval urgent and informed patient to return to clinic/ED sooner than 2 weeks if not able to tolerate PO.   Lab unable to add on urine microalb:creatinine or urine chloride. Will need these and possible hyperaldosteronism labs and folate next visit if abnormalities persist.   Patient seen with Dr. Rebeca Alert.  Jeralyn Bennett, MD 01/31/2020, 8:01 PM Pager: [phone]

## 2020-01-31 NOTE — Assessment & Plan Note (Signed)
Severe vitamin D deficiency diagnosed last visit.   - Continue 50,000 IU weekly for ~2 months then reassess

## 2020-01-31 NOTE — Assessment & Plan Note (Addendum)
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 Patient has had normal thyroid testing in the recent past. Repeat TSH, free T3/T4 were WNL. Unlikely contributing to her weight loss.   - Continue to assess on physical exams

## 2020-01-31 NOTE — Assessment & Plan Note (Addendum)
Patient describes trouble initiating swallowing. She states she cannot chew too much food at once then feels she requires extra effort in swallowing. She denies food getting stuck in her esophagus. Has not eaten food over the past week other than 1 orange, relying on Ensure for nutrition. Less difficulty swallowing fluids. Denies nausea, vomiting. Has lost >20 lbs in 2 weeks.   - Referral sent for SLP evaluation  - Would likely benefit from barium swallow and possible EGD - Parking pass given to patient for significant weight loss and fatigue.

## 2020-01-31 NOTE — Assessment & Plan Note (Signed)
Patient has had significant weight loss with long smoking history, concerning for cancer. Not yet eligible for lung cancer screening.   - Check CXR

## 2020-02-01 ENCOUNTER — Telehealth: Payer: Self-pay | Admitting: Student

## 2020-02-01 ENCOUNTER — Telehealth: Payer: Self-pay | Admitting: *Deleted

## 2020-02-01 ENCOUNTER — Other Ambulatory Visit: Payer: Self-pay | Admitting: Student

## 2020-02-01 DIAGNOSIS — N2589 Other disorders resulting from impaired renal tubular function: Secondary | ICD-10-CM

## 2020-02-01 DIAGNOSIS — R809 Proteinuria, unspecified: Secondary | ICD-10-CM

## 2020-02-01 DIAGNOSIS — E876 Hypokalemia: Secondary | ICD-10-CM

## 2020-02-01 MED ORDER — POTASSIUM CHLORIDE ER 20 MEQ PO TBCR: 40.0000 meq | EXTENDED_RELEASE_TABLET | Freq: Every day | ORAL | 0 refills | Status: DC

## 2020-02-01 MED FILL — POTASSIUM CHLORIDE CRYS ER: 20 | 20 days supply | Qty: 40 | Fill #0

## 2020-02-01 NOTE — Addendum Note (Signed)
Addended by: Jeralyn Bennett on: 02/01/2020 12:02 PM   Modules accepted: Orders

## 2020-02-01 NOTE — Telephone Encounter (Signed)
Patient called in. Relayed info below. She will p/u Rx for potassium tomorrow and begin taking daily. Patient previously scheduled f/u with [facility] for 02/15/2020 at [place]. Hubbard Hartshorn, BSN, RN-BC

## 2020-02-01 NOTE — Telephone Encounter (Signed)
Opened in error

## 2020-02-01 NOTE — Telephone Encounter (Signed)
Left voicemail for Stephanie Padilla, informing her that her potassium remains low. Is likely due to potassium wasting given relatively elevated urinary potassium. Will also check urinary microalbumin:creatinine and chloride for assessment of Barter's/Gittleman's given chronicity and albuminuria. May also consider hyperaldosteronism as cause given relatively elevated blood pressures in setting of poor PO intake, although lower on the differential. She does have persistent macrocytosis without anemia. B12 previously normal, hemosylate folate checked last visit. Urine did show trace leukocytes and some WBC although had squamous cells, likely contaminant. Left mychart message for patient and notified front desk to continue to call for appointment follow up.   - CXR and brain MRI ordered for later this month - Prescribed potassium 68mEq PO daily until follow up appointment (20 days' worth sent) - consider hyperaldosteronism labs next visit - encouraged continued PO and water intake as tolerated - instructed patient to call more emergently if unable to swallow  - consider folate check next visit

## 2020-02-06 ENCOUNTER — Other Ambulatory Visit: Payer: Self-pay | Admitting: Student

## 2020-02-06 DIAGNOSIS — R1312 Dysphagia, oropharyngeal phase: Secondary | ICD-10-CM

## 2020-02-08 ENCOUNTER — Other Ambulatory Visit: Payer: Self-pay

## 2020-02-08 ENCOUNTER — Ambulatory Visit (HOSPITAL_COMMUNITY)
Admission: RE | Admit: 2020-02-08 | Discharge: 2020-02-08 | Disposition: A | Discharge status: Home / Self Care | Payer: Medicaid Other | Source: Ambulatory Visit | Attending: Internal Medicine | Admitting: Internal Medicine

## 2020-02-08 DIAGNOSIS — G309 Alzheimer's disease, unspecified: Secondary | ICD-10-CM | POA: Insufficient documentation

## 2020-02-08 MED ORDER — GADOBUTROL 1 MMOL/ML IV SOLN
4.0000 mL | Freq: Once | INTRAVENOUS | Status: AC | PRN
  Administered 2020-02-08: 4 mL via INTRAVENOUS

## 2020-02-09 ENCOUNTER — Other Ambulatory Visit (HOSPITAL_COMMUNITY): Payer: Self-pay

## 2020-02-09 DIAGNOSIS — R1310 Dysphagia, unspecified: Secondary | ICD-10-CM

## 2020-02-12 NOTE — Progress Notes (Signed)
Internal Medicine Clinic Attending  I saw and evaluated the patient.  I personally confirmed the key portions of the history and exam documented by Dr. Konrad Penta and I reviewed pertinent patient test results.  The assessment, diagnosis, and plan were formulated together and I agree with the documentation in the resident's note.  Lenice Pressman, M.D., Ph.D.

## 2020-02-15 ENCOUNTER — Ambulatory Visit (HOSPITAL_COMMUNITY)
Admission: RE | Admit: 2020-02-15 | Discharge: 2020-02-15 | Disposition: A | Discharge status: Home / Self Care | Payer: Medicaid Other | Source: Ambulatory Visit | Attending: Internal Medicine | Admitting: Internal Medicine

## 2020-02-15 ENCOUNTER — Telehealth: Payer: Self-pay | Admitting: Student

## 2020-02-15 ENCOUNTER — Other Ambulatory Visit: Payer: Self-pay | Admitting: Internal Medicine

## 2020-02-15 ENCOUNTER — Encounter: Payer: Self-pay | Admitting: Student

## 2020-02-15 ENCOUNTER — Ambulatory Visit: Payer: Medicaid Other | Admitting: Student

## 2020-02-15 ENCOUNTER — Other Ambulatory Visit: Payer: Self-pay

## 2020-02-15 VITALS — BP 126/96 | HR 103 | Temp 98.4°F | Wt 101.8 lb

## 2020-02-15 DIAGNOSIS — E042 Nontoxic multinodular goiter: Secondary | ICD-10-CM | POA: Diagnosis not present

## 2020-02-15 DIAGNOSIS — F339 Major depressive disorder, recurrent, unspecified: Secondary | ICD-10-CM | POA: Diagnosis not present

## 2020-02-15 DIAGNOSIS — E559 Vitamin D deficiency, unspecified: Secondary | ICD-10-CM | POA: Diagnosis not present

## 2020-02-15 DIAGNOSIS — D7589 Other specified diseases of blood and blood-forming organs: Secondary | ICD-10-CM | POA: Diagnosis not present

## 2020-02-15 DIAGNOSIS — R634 Abnormal weight loss: Secondary | ICD-10-CM

## 2020-02-15 DIAGNOSIS — R1310 Dysphagia, unspecified: Secondary | ICD-10-CM | POA: Insufficient documentation

## 2020-02-15 DIAGNOSIS — R1312 Dysphagia, oropharyngeal phase: Secondary | ICD-10-CM | POA: Insufficient documentation

## 2020-02-15 DIAGNOSIS — R413 Other amnesia: Secondary | ICD-10-CM | POA: Diagnosis not present

## 2020-02-15 DIAGNOSIS — E876 Hypokalemia: Secondary | ICD-10-CM | POA: Diagnosis not present

## 2020-02-15 DIAGNOSIS — R000 Tachycardia, unspecified: Secondary | ICD-10-CM

## 2020-02-15 DIAGNOSIS — E049 Nontoxic goiter, unspecified: Secondary | ICD-10-CM

## 2020-02-15 LAB — CBC WITH DIFFERENTIAL/PLATELET
Abs Immature Granulocytes: 0.02 10*3/uL (ref 0.00–0.07)
Basophils Absolute: 0 10*3/uL (ref 0.0–0.1)
Basophils Relative: 0 %
Eosinophils Absolute: 0 10*3/uL (ref 0.0–0.5)
Eosinophils Relative: 0 %
HCT: 43.5 % (ref 36.0–46.0)
Hemoglobin: 14.7 g/dL (ref 12.0–15.0)
Immature Granulocytes: 0 %
Lymphocytes Relative: 26 %
Lymphs Abs: 1.5 10*3/uL (ref 0.7–4.0)
MCH: 35.6 pg — ABNORMAL HIGH (ref 26.0–34.0)
MCHC: 33.8 g/dL (ref 30.0–36.0)
MCV: 105.3 fL — ABNORMAL HIGH (ref 80.0–100.0)
Monocytes Absolute: 0.5 10*3/uL (ref 0.1–1.0)
Monocytes Relative: 8 %
Neutro Abs: 3.9 10*3/uL (ref 1.7–7.7)
Neutrophils Relative %: 66 %
Platelets: 517 10*3/uL — ABNORMAL HIGH (ref 150–400)
RBC: 4.13 MIL/uL (ref 3.87–5.11)
RDW: 15.4 % (ref 11.5–15.5)
WBC: 5.9 10*3/uL (ref 4.0–10.5)
nRBC: 0.3 % — ABNORMAL HIGH (ref 0.0–0.2)

## 2020-02-15 LAB — SAVE SMEAR(SSMR), FOR PROVIDER SLIDE REVIEW

## 2020-02-15 NOTE — Patient Instructions (Addendum)
Ms. Jutras,   Today, we discussed that you will need to mention your concerns to your therapist/physician at [facility] Friday. You will also need a CXR while you are down for your swallow study.   We will check additional blood work and a urine test to see why you may be having this weight loss.   I have ordered a thyroid ultrasound and you will receive a call to schedule this.   Please continue taking all of your medications as prescribed, and I will call you with results when available.   Please schedule follow up visit in clinic in about 2 weeks.   Thank you and take care!  Dr. Konrad Penta    Preventing Consequences of Unhealthy Weight Loss Behaviors, Adult Reaching and maintaining a healthy weight is important for your overall health. A healthy weight will vary from person to person. It is natural to want to lose weight quickly, using whatever methods seem fastest. However, losing weight in a healthy way is not a quick process. Instead, aim for slow, steady weight loss by making small changes and setting achievable goals. How can unhealthy weight loss behaviors affect me? Using unhealthy behaviors to try to lose weight can cause:  Tiredness, low heart rate, and low blood pressure.  Imbalances in your body. These may be imbalances in: ? Electrolytes. These are salts and minerals in your blood. ? Chemicals. These are needed so your body can work properly. ? Body fluids. Loss of fluids may lead to dehydration.  Organ damage or organ failure, especially affecting the kidneys.  Thin bones that break easily.  Staying away from others, or relationship problems with your friends and family.  Emotional problems, including depression and anxiety.  A greater risk of an eating disorder. If you develop an eating disorder, you could develop serious health problems and complications that affect your organs and bodily processes. Changing unhealthy weight loss behaviors through lifestyle  changes will improve your overall health. Maintaining a healthy weight also lowers your risk of certain conditions, such as:  Type 2 diabetes.  Heart disease, high cholesterol, and high blood pressure.  Osteoarthritis. This affects your joints.  Osteoporosis. This affects your bones.  Some cancers. What can increase my risk for unhealthy weight loss behaviors? Certain views or feelings about yourself and certain habits can increase your risk of unhealthy weight loss behaviors. These include:  Having depression and being overweight as an adult.  Attempting weight loss as a child or teen.  Using alcohol, drugs, or tobacco products. What actions can I take to prevent these behaviors? You can make certain lifestyle changes to help you lose weight in a healthy way. These include eating nutritious foods and exercising regularly. Nutrition  Eat a variety of healthy foods, including fruits and vegetables, whole grains, lean proteins, and low-fat dairy products.  Drink water instead of sugary drinks.  Drink enough fluid to keep your urine pale yellow.  Plan healthy, balanced meals. Work with a Financial planner (dietitian) to make a healthy meal plan that works for you.  Limit the following: ? Foods that are high in fat, salt (sodium), or sugar. These include candy, donuts, pizza, and fast foods. ? Maceo Pro or heavily processed foods. ? Drinks that contain a lot of sugar.   Lifestyle  Avoid these unhealthy eating habits: ? Following a diet that restricts entire types of food. This may be a popular diet that promises extreme results in a short time. ? Skipping meals to save calories. ?  Not eating anything for long periods of time (fasting). ? Restricting your calories to far fewer than the number that you need to lose or maintain a healthy weight. ? Taking laxative pills to make you have more frequent bowel movements. ? Taking medicines to make your body lose excess fluids  (diuretics). ? Eating an excessive amount of food and then making yourself vomit. This is known as bingeing and purging.  If you drink alcohol: ? Limit how much you use to:  0-1 drink a day for nonpregnant women.  0-2 drinks a day for men. ? Be aware of how much alcohol is in your drink. In the [place] one drink equals one 12 oz bottle of beer (355 mL), one 5 oz glass of wine (148 mL), or one 1 oz glass of hard liquor (44 mL).  Do not use any products that contain nicotine or tobacco, such as cigarettes, e-cigarettes, and chewing tobacco. If you need help quitting, ask your health care provider. Activity  Avoid compulsively getting an extreme amount of exercise.  Work with a Microbiologist to make a healthy exercise program. ? Include different types of exercise in your exercise program, such as strengthening, aerobic, and flexibility exercises. ? To maintain your weight, get at least 150 minutes of moderate-intensity exercise each week. Moderate-intensity exercise could be brisk walking or biking. ? To lose a healthy amount of weight, get 60 minutes of moderate-intensity exercise each day.  Find ways to reduce stress, such as regular exercise or meditation.  Find a hobby or other activity that you enjoy to distract you from eating when you feel stressed or bored.   Where to find support For more support, talk with:  Your health care provider or dietitian. Ask about support groups.  A mental health care provider.  Family and friends. Where to find more information Learn more about how to prevent complications from unhealthy weight loss behaviors from:  Centers for Disease Control and Prevention: http://www.wolf.info/  National Institute of Mental Health: https://carter.com/  National Eating Disorders Association: www.nationaleatingdisorders.org Contact a health care provider if:  You often feel very tired.  You notice changes in your skin or your hair.  You faint because of dehydration or  too much exercise.  You struggle to change your unhealthy weight loss behaviors on your own.  Unhealthy weight loss behaviors are affecting your daily life or your relationships.  You have signs or symptoms of an eating disorder.  You have major weight changes in a short period of time.  You feel guilty or ashamed about eating or exercising. Summary  Using unhealthy eating behaviors to try to lose weight can cause a variety of physical and emotional problems that affect your overall health and well-being.  Aim for slow, steady weight loss by choosing healthy foods, avoiding unhealthy eating habits, and exercising regularly.  Contact your health care provider if you struggle to change your behaviors on your own or if you think that you may have an eating disorder. This information is not intended to replace advice given to you by your health care provider. Make sure you discuss any questions you have with your health care provider. Document Revised: 11/29/2018 Document Reviewed: 11/29/2018 Elsevier Patient Education  2021 [employer].

## 2020-02-15 NOTE — Progress Notes (Signed)
d 

## 2020-02-15 NOTE — Telephone Encounter (Addendum)
Called Ms. Sudano multiple times this afternoon. 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 I called and informed her that this could be due to various etiologies, including stricture from chronic GERD, although concerned for possibility of cancer. She was scheduled for urgent GI follow up appointment at [facility] tomorrow morning at 8:30am. Transportation through [facility] was arranged.   However, upon communicating these findings to the patient, she states that she called to cancel her GI appointment tomorrow. She was concerned stating she already had an appointment with [facility] for telehealth appointment later in the morning and also has an [employer] appointment at 11:45am and didn't feel comfortable being dropped off by a stranger.  Given that she stated her depression is secondary to her troubles swallowing with significant impact on her physical health and inability to swallow medication currently that would help with her depression, informed her she should consider trying to reschedule her GI appointment. She was in agreement.   Called on call provider for [facility], Dr. Tarri Glenn, who was unable to tell if she had cancelled her appointment and unable to reschedule her, although advised patient go to her appointment tomorrow at scheduled time in hopes of being seen. Patient said she would do so and would get her friend to drive her to her appointment.   - Will follow up to see if patient was able to make her appointment  - Says she was rescheduled for 03/05/20, although if unable to make her appointment tomorrow, will see if more urgent evaluation could be scheduled - Will call [facility] in the morning to see if her appointment could be rescheduled sooner rather than later for follow up of depression  - All questions and concerns addressed  Jeralyn Bennett, PGY1 02/15/2020,  5:59 PM Pager: [phone]

## 2020-02-15 NOTE — Telephone Encounter (Signed)
°   Stephanie Padilla DOB: 08-02-1965 MRN: [id number]   RIDER WAIVER AND RELEASE OF LIABILITY  For purposes of improving physical access to our facilities, [facility] is pleased to partner with third parties to provide [facility] patients or other authorized individuals the option of convenient, on-demand ground transportation services (the [employer]) through use of the technology service that enables users to request on-demand ground transportation from independent third-party providers.  By opting to use and accept these [employer], I, the undersigned, hereby agree on behalf of myself, and on behalf of any minor child using the [employer] for whom I am the parent or legal guardian, as follows:  1. Government social research officer provided to me are provided by independent third-party transportation providers who are not [employer] or employees and who are unaffiliated with [employer]. 2. [facility] is neither a transportation carrier nor a common or public carrier. 3. [facility] has no control over the quality or safety of the transportation that occurs as a result of the [employer]. 4. [facility] cannot guarantee that any third-party transportation provider will complete any arranged transportation service. 5. [facility] makes no representation, warranty, or guarantee regarding the reliability, timeliness, quality, safety, suitability, or availability of any of the Transport Services or that they will be error free. 6. I fully understand that traveling by vehicle involves risks and dangers of serious bodily injury, including permanent disability, paralysis, and death. I agree, on behalf of myself and on behalf of any minor child using the Transport Services for whom I am the parent or legal guardian, that the entire risk arising out of my use of the [employer] remains solely with me, to the maximum extent permitted under applicable law. 7. The [employer] are provided as is and as available. [facility] disclaims all representations and warranties, express, implied or statutory, not expressly set out in these terms, including the implied warranties of merchantability and fitness for a particular purpose. 8. I hereby waive and release [place], its agents, employees, officers, directors, representatives, insurers, attorneys, assigns, successors, subsidiaries, and affiliates from any and all past, present, or future claims, demands, liabilities, actions, causes of action, or suits of any kind directly or indirectly arising from acceptance and use of the [employer]. 9. I further waive and release [facility] and its affiliates from all present and future liability and responsibility for any injury or death to persons or damages to property caused by or related to the use of the [employer]. 10. I have read this Waiver and Release of Liability, and I understand the terms used in it and their legal significance. This Waiver is freely and voluntarily given with the understanding that my right (as well as the right of any minor child for whom I am the parent or legal guardian using the [employer]) to legal recourse against [facility] in connection with the [employer] is knowingly surrendered in return for use of these services.   I attest that I read the consent document to [place], gave Ms. Bogacz the opportunity to ask questions and answered the questions asked (if any). I affirm that Stephanie Padilla then provided consent for she's participation in this program.     Legrand Pitts

## 2020-02-15 NOTE — Progress Notes (Signed)
Modified Barium Swallow Progress Note  Patient Details  Name: Stephanie Padilla MRN: [id number] Date of Birth: 12/15/1965  Today's Date: 02/15/2020  Modified Barium Swallow completed.  Full report located under Chart Review in the Imaging Section.  Brief recommendations include the following:  Clinical Impression  Pt demonstrates likely a primary esophageal dysphagia based on findings today. Pt able to orally manipulate thin liquids without difficulty and pharyngeal swallow is normal. When given purees and solids, oral phase is prolonged with pt rolling bolus around for an excessive amount of time prior to gathering and propelling bolus. Again pharyngeal phase WNL. WHen given a pill, the pt was able to initiate swallow, but pill passed into the mid esophagus and then refluxed back tto he proximal esophagus and lodged just below the UES with pt sensation. Seond later pt regurgitated the pilld and most of the solids given. No esophageal sweep was actually completed. Discontinued study and arranged for an Esophagram with MD.   Stephanie Padilla Evaluation Recommendations   Recommended Consults: Consider esophageal assessment;Consider GI evaluation   SLP Diet Recommendations: Thin liquid       Medication Administration: Crushed with puree   Supervision: Patient able to self feed                  Herbie Baltimore, MA CCC-SLP  Acute Rehabilitation Services Pager [phone] Office [phone]   Othelia Pulling, Katherene Ponto 02/15/2020,1:32 PM

## 2020-02-16 ENCOUNTER — Ambulatory Visit: Payer: Medicaid Other | Admitting: Physician Assistant

## 2020-02-16 ENCOUNTER — Other Ambulatory Visit: Payer: Self-pay | Admitting: Internal Medicine

## 2020-02-16 ENCOUNTER — Telehealth: Payer: Self-pay | Admitting: Student

## 2020-02-16 LAB — PATHOLOGIST SMEAR REVIEW

## 2020-02-16 LAB — BMP8+ANION GAP
Anion Gap: 18 mmol/L (ref 10.0–18.0)
BUN/Creatinine Ratio: 14 (ref 9–23)
BUN: 7 mg/dL (ref 6–24)
CO2: 29 mmol/L (ref 20–29)
Calcium: 9.5 mg/dL (ref 8.7–10.2)
Chloride: 96 mmol/L (ref 96–106)
Creatinine, Ser: 0.5 mg/dL — ABNORMAL LOW (ref 0.57–1.00)
GFR calc Af Amer: 129 mL/min/{1.73_m2} (ref >59)
GFR calc non Af Amer: 112 mL/min/{1.73_m2} (ref >59)
Glucose: 97 mg/dL (ref 65–99)
Potassium: 3.1 mmol/L — ABNORMAL LOW (ref 3.5–5.2)
Sodium: 143 mmol/L (ref 134–144)

## 2020-02-16 LAB — VITAMIN D 25 HYDROXY (VIT D DEFICIENCY, FRACTURES): Vit D, 25-Hydroxy: 20.4 ng/mL — ABNORMAL LOW (ref 30.0–100.0)

## 2020-02-16 MED ORDER — POTASSIUM CHLORIDE 20 MEQ/15ML (10%) PO SOLN: 20.0000 meq | Freq: Every day | ORAL | 2 refills | Status: DC

## 2020-02-16 NOTE — Assessment & Plan Note (Signed)
Most recent [place] score 17, lower than expected for age, with subjective short term memory loss and increased agitation / worsening depression. Concerning for Lewy Body dementia vs. Other dementia. MRI 02/08/20 shows T2 hyperintensities that are non-specific, most likely representing microvascular ischemic changes although could also represent demyelination, less likely infectious process given no fevers, rashes, exposure to likely infectious source.   - Continue to monitor closely - Consider neurology referral once GI evaluation complete

## 2020-02-16 NOTE — Addendum Note (Signed)
Addended by: Jeralyn Bennett on: 02/16/2020 01:47 PM   Modules accepted: Orders

## 2020-02-16 NOTE — Progress Notes (Signed)
CC: Weight Loss  HPI:  [name] is a 55 y.o. underweight lady w/ PMHx chronic gastritis with peptic duodenitis, TUD, multiple nodular goiter, MDD with psychosis, and worsening memory loss recently, presenting for follow up of weight loss that has persisted since September 2021. She states she continues to have difficulties swallowing solid foods more so than liquids. She denies choking episodes, or feelings of food getting stuck in her esophagus. Denies heartburn, throat pain, nausea, vomiting, abdominal pain, changes in stools including dark or bloody stools. She has had intermittent night sweats, diaphoresis, and headaches, which do not seem to occur at the same time. Endorses worsening of her depression, which she believes is secondary to her weight loss, and expresses frustration in not understanding why she continues to have troubles with weight loss. Continues to endorse auditory and visual hallucinations, hearing shadows, although denies any SI or HI and states she has a follow up appointment with [facility] 02/16/20. When asked if she has had similar weight loss in the past, she says "yes" although is unsure why. States she does not currently follow with endocrinology for multinodular goiter.   Past Medical History:  Diagnosis Date  . ALLERGIC RHINITIS 10/10/2009   Qualifier: Diagnosis of  By: Guy Sandifer DO, Darrick Penna    . CANNABIS ABUSE 04/17/2008   Qualifier: Diagnosis of  By: Redmond Pulling  MD, Mateo Flow    . Chronic abdominal pain    Chronic Abd distension / bloating. W/U includes CT ABD & pelvis 7/08 negative. EGD 5/09 Dr Penelope Coop :erythematous gastric mucosa and bx c/w chronic gastritis - was neg for H pylori. Duo polyp had path c/w peptic duodenitis  . Depression   . DEPRESSION 08/05/2006   Qualifier: Diagnosis of  By: Redmond Pulling  MD, Mateo Flow    . Depression   . GERD 08/05/2006   Qualifier: Diagnosis of  By: Redmond Pulling  MD, Mateo Flow    . Goiter, unspecified 10/16/2009   Qualifier:  Diagnosis of  By: Guy Sandifer DO, Darrick Penna    . Hemorrhoids 01/18/2012  . Hernia of abdominal cavity   . INSOMNIA 10/15/2008   Qualifier: Diagnosis of  By: Redmond Pulling  MD, Mateo Flow    . Irritable bowel syndrome 08/06/2009   Qualifier: Diagnosis of  By: Criss Alvine    . NICOTINE ADDICTION 07/06/2008   Started on Chantix.    . 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 Spontaneous abortion    2   Social Hx: Denies any sick contacts.   Review of Systems:    Also positive for: mild generalized weakness, fatigue Negative for: 10 point review of systems except as noted above in HPI. Specifically no SOB, cough.  Physical Exam:  Vitals:   02/15/20 1008  BP: (!) 126/96  Pulse: (!) 103  Temp: 98.4 F (36.9 C)  TempSrc: Oral  SpO2: 100%  Weight: 101 lb 12.8 oz (46.2 kg)   General: Patient appears thin and chronically ill, older than stated age. No acute distress.  Eyes: Patient has false left eye. No eye drainage or conjunctivitis.  HENT: Neck is supple, without palpable neck mass, lymphadenopathy, or thyromegaly. No nasal discharge.   Respiratory: There are mild, scattered rhonchi in upper lobes although lungs otherwise CTA, bilaterally without tachypnea or increased work of breathing.  Cardiovascular: Rate is borderline tachycardic. Rhythm is regular. No murmurs, rubs, or gallops. No lower extremity edema. Musculoskeletal: There is temporal wasting bilaterally with evidence of cachexia in bilateral lower extremities. No joint tenderness.  Neurological: Patient has mildly impaired short term memory, with intact attention.  Abdominal: Soft, not distended, and non-tender to palpation. Bowel sounds intact throughout. No rebound or guarding. Skin: No lesions. No rashes.  Psych: Affect is flat. Does not appear to be responding to external stimuli.   Assessment & Plan:   See Encounters Tab for problem based charting.  Patient discussed with Dr. Daryll Drown.  Jeralyn Bennett, MD 02/16/2020, 7:54 AM Pager: [phone]

## 2020-02-16 NOTE — Telephone Encounter (Addendum)
Spoke with Mr. Stephanie Padilla and informed him that [name] should stop taking oral potassium supplement and start taking potassium solution, 73mL once daily and increase to 8mL daily if tolerated. Advised him to mix this into soft foods or drinks if not tolerated alone.   He will check to see which other medications she is taking and we can see whether she may be able to take alternative or crushed forms.   Addendum 2:00pm: Called Mr. Stephanie Padilla back to tell him potassium will be ready today and to have her STOP taking Mirtazapine given it is for appetite stimulation and her trouble is mechanical. Advised him to continue her other medications as she is able to take them orally without crushing or breaking them, although notified him it is okay if she is not able to take these every day. If unable to take them at all, advised he call the clinic to see if she can crush alternative medications.   Jeralyn Bennett, MD 02/16/2020, 1:44 PM Pager: [phone]

## 2020-02-16 NOTE — Assessment & Plan Note (Signed)
Patient has history of MDD with psychosis. Endorses worsening of her depression, which she believes is secondary to her weight loss, and expresses frustration in not understanding why she continues to have troubles with weight loss. Continues to endorse auditory and visual hallucinations, hearing shadows, although denies any SI or HI and states she has a follow up appointment with [facility] 02/16/20.   - Encouraged patient to make her GI appointment 02/16/20 at 8:30am  - Will call [facility] if patient unable to make her appointment to try to schedule urgent follow up

## 2020-02-16 NOTE — Assessment & Plan Note (Signed)
Patient has multinodular thyroid goiter and had previously followed with endocrinology, but no longer. Last FNA was in 2019, proving benign. However, in setting of recent troubles swallowing and normal thyroid function:  - Check thyroid ultrasound

## 2020-02-16 NOTE — Assessment & Plan Note (Addendum)
Asymptomatic sinus tachycardia present. Likely in setting of dehydration and poor PO intake.   - Check urine metanephrines  - Repeat CBC with diff  Addendum 02/16/20: awaiting pathologist smear review for evaluation of new macrocytosis. May be secondary to malnutrition vs. Malignancy.

## 2020-02-16 NOTE — Assessment & Plan Note (Signed)
Patient has had ~25 pound weight loss since September. She states she continues to have difficulties swallowing solid foods more so than liquids. She denies choking episodes, or feelings of food getting stuck in her esophagus. Denies heartburn, throat pain, nausea, vomiting, abdominal pain, changes in stools including dark or bloody stools. She has had intermittent night sweats, diaphoresis, and headaches, which do not seem to occur at the same time. Endorses worsening of her depression, which she believes is secondary to her weight loss, and expresses frustration in not understanding why she continues to have troubles with weight loss.  Addendum 02/16/20: Esophagram showed narrowing of the proximal esophagus to 5-30mm with distal irregular fold thickening of the esophagus and stomach with esophageal stasis and evidence of reflux. Does have history of chronic gastritis/duodenitis noted on previous EGD, although no hx of stricture.   - concern for possible malignancy  - urgent referral to [facility] 02/16/20 (please see telephone communications)

## 2020-02-16 NOTE — Assessment & Plan Note (Signed)
Vitamin D levels repeated today which show improvement from 7.8 to ~20. Continues to take 50,000 IU once weekly.   - Continue until mid-February then switch to vitamin D 1000 units once daily - Repeat levels in 1-3 months

## 2020-02-16 NOTE — Assessment & Plan Note (Signed)
Patient has relatively new hypokalemia since weight loss started that has not fully corrected with oral daily replacement. Most likely due to continued poor PO intake although also consider hyperaldosteronism given relative increase in BP for patient despite decreased PO intake. Denies vomiting or diarrhea making GI losses unlikely. Has esophageal narrowing limiting ability to swallow pills.   - Urgent endoscopy with GI  - If patency allows, may require increase in KCl dosing to 40 mEq PO BID - Check aldosterone/renin

## 2020-02-20 LAB — ALDOSTERONE + RENIN ACTIVITY W/ RATIO
ALDOS/RENIN RATIO: 0.7 (ref 0.0–30.0)
ALDOSTERONE: 10.1 ng/dL (ref 0.0–30.0)
Renin: 14.902 ng/mL/hr — ABNORMAL HIGH (ref 0.167–5.380)

## 2020-02-20 NOTE — Progress Notes (Signed)
Internal Medicine Clinic Attending  Case discussed with Dr. Konrad Penta  At the time of the visit.  We reviewed the resident's history and exam and pertinent patient test results.  I agree with the assessment, diagnosis, and plan of care documented in the resident's note.   Patient rescheduled her GI evaluation for 02/29/20.  She will need EGD for esophageal narrowing.

## 2020-02-23 ENCOUNTER — Telehealth: Payer: Self-pay | Admitting: Student

## 2020-02-23 NOTE — Telephone Encounter (Signed)
Called patient to inform her her CXR returned unremarkable and brain MRI returned positive for multiple T2 hyperintense foci that are nonspecific although both did not show anything concerning for malignancy. She has been able to pick up liquid potassium and is tolerating it well. Denies trouble swallowing her other medications. Still has upcoming appointment scheduled with GI.   Informed her we can discuss dacryocystocele finding on MRI at future visit although currently denies any troubles with her false eye.   Jeralyn Bennett, MD 02/23/2020, 8:52 AM Pager: [phone]

## 2020-02-24 LAB — METANEPHRINES, URINE, TOTAL
Creatinine, Urine: 506 mg/dL
Total Metanephrines/Crt Ratio: 739 ug/g — ABNORMAL HIGH

## 2020-02-29 ENCOUNTER — Other Ambulatory Visit: Payer: Self-pay

## 2020-02-29 ENCOUNTER — Encounter: Payer: Self-pay | Admitting: Physician Assistant

## 2020-02-29 ENCOUNTER — Ambulatory Visit: Payer: Medicaid Other | Admitting: Physician Assistant

## 2020-02-29 VITALS — BP 88/62 | HR 95 | Ht 64.0 in | Wt 109.0 lb

## 2020-02-29 DIAGNOSIS — R1312 Dysphagia, oropharyngeal phase: Secondary | ICD-10-CM | POA: Diagnosis not present

## 2020-02-29 DIAGNOSIS — R634 Abnormal weight loss: Secondary | ICD-10-CM | POA: Diagnosis not present

## 2020-02-29 NOTE — Progress Notes (Addendum)
Subjective:    Patient ID: Stephanie Padilla, female    DOB: [birth year], 55 y.o.   MRN: [id number]  HPI Stephanie Padilla is a 55 year old African-American female, new to GI today referred by the [facility]/Dr. Konrad Penta for evaluation of weight loss and dysphagia. Patient has had prior GI evaluation with Dr. [facility] in 2018, with EGD and colonoscopy, I cannot see those reports and results will be requested. She has history of hypertension, depression. She states that she has been having difficulty swallowing over the past several months but does not recall exactly when this started.  Her symptoms are now occurring with everything that she eats.  Generally she does not have difficulty with liquids but is having symptoms with all solid food.  She says she is able to swallow and then has the sensation that the food is getting stuck and points to her neck as the area where she feels this.  She says it is uncomfortable.  Generally she will have to stop eating and eventually the food will go on down.  She denies any episodes requiring regurgitation/vomiting.  No regular heartburn or indigestion.  No current complaints of abdominal pain, changes in bowels, melena or hematochezia. Her weight is down 25 pounds over the past 6 months or so. Recent labs 02/15/2020 with normal CBC, sed rate 3, CRP 1.0, potassium 3.1 and LFTs within normal limits albumin 3.9 She was referred for modified swallowing study with speech therapy which was consistent with an esophageal dysphagia.  The pharyngeal swallow was felt to be normal, there was prolonged oral phase with some rolling around of food for an excessive period of time.  With the pill she was able to initiate the swallow which then went to the mid esophagus and then repeat blocks back up to the UES and lodged below the UES ,she then spit out  Subsequent barium swallow on 02/15/2020 shows an approximately 6 mm focal area of narrowing approximately  6 mm in length within the area of the upper esophageal sphincter likely just below the upper esophageal sphincter distal esophagus showed normal distensibility, there was mild irregular fold thickening suggested distally and signs of feline esophagus when in the upright position.  There is some stasis in the esophagus and GERD as described.    Review of Systems Pertinent positive and negative review of systems were noted in the above HPI section.  All other review of systems was otherwise negative.  Outpatient Encounter Medications as of 02/29/2020  Medication Sig  . FLUoxetine (PROZAC) 20 MG capsule Take 2 capsules (40 mg total) by mouth daily. IM Program  . mirtazapine (REMERON) 15 MG tablet Take 1 tablet (15 mg total) by mouth at bedtime.  . Multiple Vitamin (MULTIVITAMIN WITH MINERALS) TABS tablet Take 1 tablet by mouth daily.  Marland Kitchen OLANZapine (ZYPREXA) 20 MG tablet Take 20 mg by mouth at bedtime.  . potassium chloride 20 MEQ/15ML (10%) SOLN Take 15 mLs (20 mEq total) by mouth daily. Please start with 57mL by mouth daily and increase to twice daily (54mL/day) if able to tolerate.  . venlafaxine XR (EFFEXOR-XR) 37.5 MG 24 hr capsule Take 37.5 mg by mouth daily with breakfast.   No facility-administered encounter medications on file as of 02/29/2020.   No Known Allergies Patient Active Problem List   Diagnosis Date Noted  . High blood pressure 01/31/2020  . Dysphagia 01/31/2020  . Vitamin D deficiency 01/31/2020  . Memory loss 01/31/2020  . Tachycardia 01/31/2020  .  Hypokalemia 01/08/2020  . Padilla lesion 04/17/2019  . Onychomycosis 07/18/2018  . Infection of left eye 07/18/2018  . Acute pain of right shoulder 12/07/2017  . Other abnormal glucose 06/25/2017  . 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 Decreased vision 10/23/2016  . Uterine leiomyoma 10/13/2016  . Pelvic pain 08/25/2016  . Poor appetite 06/21/2013  . Preventative health care 04/21/2012  . Hemorrhoids 01/18/2012  . 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 Goiter 10/16/2009  . Weight loss 10/10/2009  . History of tobacco abuse 07/06/2008  . Depression 08/05/2006  . GERD 08/05/2006   Social History   Socioeconomic History  . Marital status: Single    Spouse name: Not on file  . Number of children: Not on file  . Years of education: Not on file  . Highest education level: Not on file  Occupational History  . Not on file  Tobacco Use  . Smoking status: Former Smoker    Packs/day: 0.25    Types: Cigarettes    Quit date: 10/28/2019    Years since quitting: 0.3  . Smokeless tobacco: Never Used  Vaping Use  . Vaping Use: Never used  Substance and Sexual Activity  . Alcohol use: No    Alcohol/week: 0.0 standard drinks  . Drug use: Not Currently    Comment: Marijuana.; former  . Sexual activity: Not Currently    Birth control/protection: None  Other Topics Concern  . Not on file  Social History Narrative   Lives with son. 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 In relationship. Has medicaid. Got GED. Smoked cigs 10-15 yrs 1PPD> Restarted 3 months in 2011 but quit after 3 months.   Social Determinants of Health   Financial Resource Strain: Not on file  Food Insecurity: Not on file  Transportation Needs: Not on file  Physical Activity: Not on file  Stress: Not on file  Social Connections: Not on file  Intimate Partner Violence: Not on file    Stephanie Padilla's family history includes Cancer in her paternal grandmother; Cancer (age of onset: 65) in her mother; Cancer - Colon in an other family member.      Objective:    Vitals:   02/29/20 0935  BP: (!) 88/62  Pulse: 95  SpO2: 95%    Physical Exam Well-developed well-nourished thin AA female  in no acute distress.  Height, Weight,109  BMI 18.7  HEENT; nontraumatic normocephalic, EOMI, PE R LA, sclera anicteric. Oropharynx; not examined Neck; supple, no JVD no palpable thyromegaly, no adenopathy  Cardiovascular; regular rate and rhythm with S1-S2, no  murmur rub or gallop Pulmonary; Clear bilaterally Abdomen; soft, nontender, nondistended, no palpable mass or hepatosplenomegaly, bowel sounds are active Rectal; not done Skin; benign exam, no jaundice rash or appreciable lesions Extremities; no clubbing cyanosis or edema skin warm and dry Neuro/Psych; alert and oriented x4, grossly nonfocal mood and affect appropriate       Assessment & Plan:   #82 55 year old African-American female with several month history of somewhat progressive solid food dysphagia and secondary 25 pound weight loss. Recent barium swallow concerning for stricture versus lesion in the proximal esophagus in the area of the UES.  There are also signs of dysmotility and mild irregular fold thickening in the distal esophagus and proximal stomach and some stasis in the esophagus with GERD. Pill became lodged at this area and maximal caliber with pill lodged 5 to 6 mm  Rule out esophageal neoplasm, rule out proximal esophageal stricture  Appears to also have  component of dysmotility.  #2 history of hypertension 3.  History of major depression #4 history of colon polyps by patient report, last colonoscopy 2018  Plan; patient was encouraged to follow a very soft/mechanical soft diet and sip liquids between bites.  Advised she may want to avoid meat altogether in the short-term.  Patient will be scheduled for upper endoscopy with possible esophageal dilation with Dr. Bryan Lemma.  Procedure was discussed in detail with the patient including indications risks and benefits and she is agreeable to proceed. She has completed COVID-19 vaccination. Patient is signed a release and we have requested her records from [facility]/Dr. Penelope Coop from 2018.  Addendum-records received and reviewed from Dr. Penelope Coop. Colonoscopy 01/04/2017-prep fair normal exam with exception of internal hemorrhoids  Colonoscopy 2014 Dr. Venia Minks external hemorrhoids, medium sized internal hemorrhoids  otherwise normal exam  EGD 01/04/2017 normal-appearing esophagus small hiatal hernia Gastric biopsy showed chronic inactive gastritis no evidence of H. pylori dysplasia or malignancy Records will be scanned into epic   Alfredia Ferguson PA-C 02/29/2020   Cc: [name], DO

## 2020-02-29 NOTE — Patient Instructions (Signed)
If you are age 53 or older, your body mass index should be between 23-30. Your Body mass index is 18.71 kg/m. If this is out of the aforementioned range listed, please consider follow up with your Primary Care Provider.  If you are age 81 or younger, your body mass index should be between 19-25. Your Body mass index is 18.71 kg/m. If this is out of the aformentioned range listed, please consider follow up with your Primary Care Provider.   You have been scheduled for an endoscopy. Please follow written instructions given to you at your visit today. If you use inhalers (even only as needed), please bring them with you on the day of your procedure.  Follow up pending the results of your Endoscopy or as needed.  Thank you for entrusting me with your care and choosing [facility].  Amy Esterwood, PA-C

## 2020-03-01 NOTE — Progress Notes (Signed)
Agree with the assessment and plan as outlined by Nicoletta Ba, PA-C.  Plan for EGD to evaluate for dysphagia and irregular finding on recent esophagram, with plan for esophageal dilation and/or biopsies as appropriate.  Can also evaluate abnormality in distal esophagus/proximal stomach at time of EGD.  If EGD otherwise unrevealing, likely plan for Esophageal Manometry and possibly CT chest/neck to look for extraluminal compression (dysphagia lusoria?).   Brittani Purdum, DO, [facility]

## 2020-03-08 ENCOUNTER — Ambulatory Visit (AMBULATORY_SURGERY_CENTER): Payer: Medicaid Other | Admitting: Gastroenterology

## 2020-03-08 ENCOUNTER — Other Ambulatory Visit: Payer: Self-pay

## 2020-03-08 ENCOUNTER — Encounter: Payer: Self-pay | Admitting: Gastroenterology

## 2020-03-08 VITALS — BP 146/100 | HR 99 | Temp 96.8°F | Resp 14 | Ht 64.0 in | Wt 109.0 lb

## 2020-03-08 DIAGNOSIS — R1310 Dysphagia, unspecified: Secondary | ICD-10-CM | POA: Diagnosis not present

## 2020-03-08 DIAGNOSIS — K2980 Duodenitis without bleeding: Secondary | ICD-10-CM | POA: Diagnosis not present

## 2020-03-08 DIAGNOSIS — K222 Esophageal obstruction: Secondary | ICD-10-CM

## 2020-03-08 DIAGNOSIS — K219 Gastro-esophageal reflux disease without esophagitis: Secondary | ICD-10-CM | POA: Diagnosis not present

## 2020-03-08 DIAGNOSIS — K449 Diaphragmatic hernia without obstruction or gangrene: Secondary | ICD-10-CM

## 2020-03-08 MED ORDER — OMEPRAZOLE 20 MG PO CPDR: 20.0000 mg | DELAYED_RELEASE_CAPSULE | Freq: Every day | ORAL | 3 refills | Status: DC

## 2020-03-08 MED ORDER — SODIUM CHLORIDE 0.9 % IV SOLN: 500.0000 mL | Freq: Once | INTRAVENOUS | Status: DC

## 2020-03-08 NOTE — Progress Notes (Signed)
Report to PACU, RN, vss, BBS= Clear.  

## 2020-03-08 NOTE — Patient Instructions (Signed)
Stay on a soft diet all day today.  Continue current medications Await pathology results Use prilosec 20mg  twice daily at least 30 min before eating .  Do this for 6 weeks Repeat EGD as needed for retreatment  YOU HAD AN ENDOSCOPIC PROCEDURE TODAY AT [facility]:   Refer to the procedure report that was given to you for any specific questions about what was found during the examination.  If the procedure report does not answer your questions, please call your gastroenterologist to clarify.  If you requested that your care partner not be given the details of your procedure findings, then the procedure report has been included in a sealed envelope for you to review at your convenience later.  YOU SHOULD EXPECT: Some feelings of bloating in the abdomen. Passage of more gas than usual.  Walking can help get rid of the air that was put into your GI tract during the procedure and reduce the bloating. If you had a lower endoscopy (such as a colonoscopy or flexible sigmoidoscopy) you may notice spotting of blood in your stool or on the toilet paper. If you underwent a bowel prep for your procedure, you may not have a normal bowel movement for a few days.  Please Note:  You might notice some irritation and congestion in your nose or some drainage.  This is from the oxygen used during your procedure.  There is no need for concern and it should clear up in a day or so.  SYMPTOMS TO REPORT IMMEDIATELY:   Following upper endoscopy (EGD)  Vomiting of blood or coffee ground material  New chest pain or pain under the shoulder blades  Painful or persistently difficult swallowing  New shortness of breath  Fever of 100F or higher  Black, tarry-looking stools  For urgent or emergent issues, a gastroenterologist can be reached at any hour by calling [phone]. Do not use MyChart messaging for urgent concerns.   DIET:  We do recommend a small meal at first, but then you may proceed to your  regular diet.  Drink plenty of fluids but you should avoid alcoholic beverages for 24 hours.  ACTIVITY:  You should plan to take it easy for the rest of today and you should NOT DRIVE or use heavy machinery until tomorrow (because of the sedation medicines used during the test).    FOLLOW UP: Our staff will call the number listed on your records 48-72 hours following your procedure to check on you and address any questions or concerns that you may have regarding the information given to you following your procedure. If we do not reach you, we will leave a message.  We will attempt to reach you two times.  During this call, we will ask if you have developed any symptoms of COVID 19. If you develop any symptoms (ie: fever, flu-like symptoms, shortness of breath, cough etc.) before then, please call [phone].  If you test positive for Covid 19 in the 2 weeks post procedure, please call and report this information to [place].    If any biopsies were taken you will be contacted by phone or by letter within the next 1-3 weeks.  Please call us at [phone] if you have not heard about the biopsies in 3 weeks.   SIGNATURES/CONFIDENTIALITY: You and/or your care partner have signed paperwork which will be entered into your electronic medical record.  These signatures attest to the fact that that the information above on your After  Visit Summary has been reviewed and is understood.  Full responsibility of the confidentiality of this discharge information lies with you and/or your care-partner. 

## 2020-03-08 NOTE — Progress Notes (Signed)
Pt's states no medical or surgical changes since previsit or office visit.  SB - vitals

## 2020-03-08 NOTE — Op Note (Signed)
[facility] Patient Name: Stephanie Padilla Procedure Date: 03/08/2020 10:21 AM MRN: [id number] Endoscopist: Gerrit Heck , MD Age: 55 Referring MD:  Date of Birth: December 23, 1965 Gender: Female Account #: [id number] Procedure:                Upper GI endoscopy Indications:              Dysphagia, Abnormal cine-esophagram, Weight loss Medicines:                Monitored Anesthesia Care Procedure:                Pre-Anesthesia Assessment:                           - Prior to the procedure, a History and Physical                            was performed, and patient medications and                            allergies were reviewed. The patient's tolerance of                            previous anesthesia was also reviewed. The risks                            and benefits of the procedure and the sedation                            options and risks were discussed with the patient.                            All questions were answered, and informed consent                            was obtained. Prior Anticoagulants: The patient has                            taken no previous anticoagulant or antiplatelet                            agents. ASA Grade Assessment: II - A patient with                            mild systemic disease. After reviewing the risks                            and benefits, the patient was deemed in                            satisfactory condition to undergo the procedure.                           After obtaining informed consent, the endoscope was  passed under direct vision. Throughout the                            procedure, the patient's blood pressure, pulse, and                            oxygen saturations were monitored continuously. The                            Endoscope was introduced through the mouth, and                            advanced to the second part of duodenum. The upper                            GI  endoscopy was accomplished without difficulty.                            The patient tolerated the procedure well. Scope In: Scope Out: Findings:                 One benign-appearing, intrinsic moderate stenosis                            was found 16 cm from the incisors. This stenosis                            measured 1 cm (in length). The stenosis was                            traversed. A guidewire was placed and the scope was                            withdrawn. Dilation was performed with a Savary                            dilator with mild resistance at 16 mm. The dilation                            site was examined following endoscope reinsertion                            and showed mild mucosal disruption and moderate                            improvement in luminal narrowing consistent with                            appropriate dilation of the stricture. Estimated                            blood loss was minimal.  A non-obstructing and mild Schatzki ring was found                            in the lower third of the esophagus. A guidewire                            was placed and the scope was withdrawn. Dilation                            was performed with a Savary dilator with mild                            resistance at 16 mm. The dilation site was examined                            following endoscope reinsertion and showed no                            bleeding, mucosal tear or perforation. This was                            then biopsied with a cold forceps for fracturing of                            the ring.                           A single area of ectopic gastric mucosa was found                            in the upper third of the esophagus.                           Mucosal changes including feline appearance were                            found in the lower third of the esophagus. 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 Estimated blood                            loss was minimal.                           A 2 cm hiatal hernia was present.  The entire examined stomach was normal.                           Localized moderate inflammation characterized by                            congestion (edema) and erythema was found in the                            duodenal bulb. Biopsies were taken with a cold                            forceps for histology. Estimated blood loss was                            minimal.                           The second portion of the duodenum was normal. Complications:            No immediate complications. Estimated Blood Loss:     Estimated blood loss was minimal. Impression:               - Benign-appearing esophageal stenosis. Dilated                            with 16 mm Savary with appropriate mucosal rent                            formation.                           - Non-obstructing and mild Schatzki ring. Dilated                            then fractured with cold forceps.                           - Ectopic gastric mucosa in the upper third of the                            esophagus.                           - Esophageal mucosal changes. Biopsied.                           - 2 cm hiatal hernia.                           - Normal stomach.                           - Duodenitis. Biopsied.                           - Normal second portion of the duodenum. Recommendation:           -  Patient has a contact number available for                            emergencies. The signs and symptoms of potential                            delayed complications were discussed with the                            patient. Return to normal activities tomorrow.                             Written discharge instructions were provided to the                            patient.                           - Soft diet today then advance slowly per post                            dilation protocol.                           - Continue present medications.                           - Await pathology results.                           - Use Prilosec (omeprazole) 20 mg PO BID for 6                            weeks.                           - Repeat upper endoscopy PRN for retreatment.                           - Return to GI office at appointment to be                            scheduled. Gerrit Heck, MD 03/08/2020 10:59:44 AM

## 2020-03-08 NOTE — Progress Notes (Signed)
Called to room to assist during endoscopic procedure.  Patient ID and intended procedure confirmed with present staff. Received instructions for my participation in the procedure from the performing physician.  

## 2020-03-11 ENCOUNTER — Ambulatory Visit (HOSPITAL_COMMUNITY): Payer: Medicaid Other | Attending: Internal Medicine

## 2020-03-12 ENCOUNTER — Telehealth: Payer: Self-pay

## 2020-03-12 NOTE — Telephone Encounter (Signed)
  Follow up Call-  Call back number 03/08/2020  Post procedure Call Back phone  # [phone]  Permission to leave phone message Yes  Some recent data might be hidden     Patient questions:  Do you have a fever, pain , or abdominal swelling? No. Pain Score  0 *  Have you tolerated food without any problems? Yes.    Have you been able to return to your normal activities? Yes.    Do you have any questions about your discharge instructions: Diet   Yes.   Medications  Yes.   Follow up visit  Yes.    Do you have questions or concerns about your Care? No.  Actions: * If pain score is 4 or above: No action needed, pain <4.  1. Have you developed a fever since your procedure? no  2.   Have you had an respiratory symptoms (SOB or cough) since your procedure? no  3.   Have you tested positive for COVID 19 since your procedure no  4.   Have you had any family members/close contacts diagnosed with the COVID 19 since your procedure?  no   If yes to any of these questions please route to Joylene John, RN and Joella Prince, RN

## 2020-03-14 ENCOUNTER — Encounter: Payer: Self-pay | Admitting: Gastroenterology

## 2020-03-15 ENCOUNTER — Encounter: Payer: Self-pay | Admitting: Student

## 2020-03-15 ENCOUNTER — Other Ambulatory Visit: Payer: Self-pay

## 2020-03-15 ENCOUNTER — Ambulatory Visit: Payer: Medicaid Other | Admitting: Student

## 2020-03-15 DIAGNOSIS — R1312 Dysphagia, oropharyngeal phase: Secondary | ICD-10-CM

## 2020-03-15 DIAGNOSIS — I10 Essential (primary) hypertension: Secondary | ICD-10-CM | POA: Diagnosis present

## 2020-03-15 DIAGNOSIS — R630 Anorexia: Secondary | ICD-10-CM | POA: Diagnosis not present

## 2020-03-15 NOTE — Assessment & Plan Note (Signed)
Assessment: Patient continues to endorse poor appetite and denies improvement since EGD and esophageal dilation procedure. She was also found to have peptic ulcer disease on the EGD. Believe both are contributing to poor appetite and she not waited long enough to determine if dilation will have positive effect on appetite.   That being said, patient is on many medications that will cause appetite suppression, particularly her psych meds of effexor and prozac. Discussed with patient need to express concern that these medications may be contributing to poor appetite. Patient agrees to bring this up at next psych appointment next week.   Will have patient continue to hold mirtazapine as she is on multiple serotonin elevating medications that place patient at higher risk for serotonin syndrome.  Plan: -patient to discuss with psych possibility of anti-depressants contributing to decrease in appetite -follow up in one month -d/c mirtazapine

## 2020-03-15 NOTE — Progress Notes (Signed)
CC: Decreased appetite, variable blood pressure  HPI:  Stephanie Padilla is a 55 y.o. female with a past medical history stated below and presents today for follow up on decreased appetite and for variable blood pressure. Please see problem based assessment and plan for additional details.  Past Medical History:  Diagnosis Date  . ALLERGIC RHINITIS 10/10/2009   Qualifier: Diagnosis of  By: Guy Sandifer DO, Darrick Penna    . CANNABIS ABUSE 04/17/2008   Qualifier: Diagnosis of  By: Redmond Pulling  MD, Mateo Flow    . Chronic abdominal pain    Chronic Abd distension / bloating. W/U includes CT ABD & pelvis 7/08 negative. EGD 5/09 Dr Penelope Coop :erythematous gastric mucosa and bx c/w chronic gastritis - was neg for H pylori. Duo polyp had path c/w peptic duodenitis  . Depression   . DEPRESSION 08/05/2006   Qualifier: Diagnosis of  By: Redmond Pulling  MD, Mateo Flow    . Depression   . GERD 08/05/2006   Qualifier: Diagnosis of  By: Redmond Pulling  MD, Mateo Flow    . Goiter, unspecified 10/16/2009   Qualifier: Diagnosis of  By: Guy Sandifer DO, Darrick Penna    . Hemorrhoids 01/18/2012  . Hernia of abdominal cavity   . INSOMNIA 10/15/2008   Qualifier: Diagnosis of  By: Redmond Pulling  MD, Mateo Flow    . Irritable bowel syndrome 08/06/2009   Qualifier: Diagnosis of  By: Criss Alvine    . NICOTINE ADDICTION 07/06/2008   Started on Chantix.    . 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 Spontaneous abortion    2    Current Outpatient Medications on File Prior to Visit  Medication Sig Dispense Refill  . FLUoxetine (PROZAC) 20 MG capsule Take 2 capsules (40 mg total) by mouth daily. IM Program 30 capsule 0  . mirtazapine (REMERON) 15 MG tablet Take 1 tablet (15 mg total) by mouth at bedtime. 30 tablet 2  . Multiple Vitamin (MULTIVITAMIN WITH MINERALS) TABS tablet Take 1 tablet by mouth daily.    Marland Kitchen OLANZapine (ZYPREXA) 20 MG tablet Take 20 mg by mouth at bedtime.    Marland Kitchen omeprazole (PRILOSEC) 20 MG capsule Take 1  capsule (20 mg total) by mouth daily. 90 capsule 3  . potassium chloride 20 MEQ/15ML (10%) SOLN Take 15 mLs (20 mEq total) by mouth daily. Please start with 35mL by mouth daily and increase to twice daily (33mL/day) if able to tolerate. 473 mL 2  . venlafaxine XR (EFFEXOR-XR) 37.5 MG 24 hr capsule Take 37.5 mg by mouth daily with breakfast.     No current facility-administered medications on file prior to visit.    Family History  Problem Relation Age of Onset  . Cancer Mother 83       presumed breast ca  . Cancer Paternal Grandmother        colon cancer  . Cancer - Colon Other        Uncle   . Colon cancer Maternal Grandmother   . Esophageal cancer Neg Hx   . Stomach cancer Neg Hx   . Rectal cancer Neg Hx     Social History   Socioeconomic History  . Marital status: Single    Spouse name: Not on file  . Number of children: Not on file  . Years of education: Not on file  . Highest education level: Not on file  Occupational History  . Not on file  Tobacco Use  . Smoking status: Former Smoker    Packs/day:  0.25    Types: Cigarettes    Quit date: 10/28/2019    Years since quitting: 0.3  . Smokeless tobacco: Never Used  Vaping Use  . Vaping Use: Never used  Substance and Sexual Activity  . Alcohol use: No    Alcohol/week: 0.0 standard drinks  . Drug use: Not Currently    Comment: Marijuana.; former  . Sexual activity: Not Currently    Birth control/protection: None  Other Topics Concern  . Not on file  Social History Narrative   Lives with son. 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 In relationship. Has medicaid. Got GED. Smoked cigs 10-15 yrs 1PPD> Restarted 3 months in 2011 but quit after 3 months.   Social Determinants of Health   Financial Resource Strain: Not on file  Food Insecurity: Not on file  Transportation Needs: Not on file  Physical Activity: Not on file  Stress: Not on file  Social Connections: Not on file  Intimate Partner Violence: Not on file     Review of Systems: ROS negative except for what is noted on the assessment and plan.  Vitals:   03/15/20 0947  BP: (!) 114/95  Pulse: 85  Temp: 98.7 F (37.1 C)  TempSrc: Oral  SpO2: 100%  Weight: 102 lb 12.8 oz (46.6 kg)  Height: 5\' 4"  (1.626 m)     Physical Exam: Constitutional: well-appearing, in no acute distress HENT: normocephalic atraumatic, Eyes: conjunctiva non-erythematous Neck: supple Cardiovascular: regular rate Pulmonary/Chest: normal work of breathing on room air MSK: normal bulk and tone Neurological: alert & oriented x 3 Skin: warm and dry Psych: normal mood   Assessment & Plan:   See Encounters Tab for problem based charting.  Patient discussed with Dr. Delano Metz, D.O. [facility], PGY-1 Pager: [phone], Phone: [phone] Date 03/15/2020 Time 2:28 PM

## 2020-03-15 NOTE — Patient Instructions (Signed)
Thank you, Ms.Ellyse Renee Lakatos for allowing [place] to provide your care today. Today we discussed  Decreased appetite - I believe we need more time to see if the procedure to open up your esophagus worked! We will see you in one month. I do believe that you are on multiple medications that may be causing you to have a decreased appetite. Please speak with your doctors at [facility] to see if any medication adjustments can be made.  I have ordered the following labs for you:  Lab Orders  No laboratory test(s) ordered today     Tests ordered today:  none  Referrals ordered today:   Referral Orders  No referral(s) requested today     I have ordered the following medication/changed the following medications:   Stop the following medications: There are no discontinued medications.   Start the following medications: No orders of the defined types were placed in this encounter.    Follow up: 1 month  Should you have any questions or concerns please call the internal medicine clinic at [phone].     Sanjuana Letters, D.O. [facility]

## 2020-03-15 NOTE — Assessment & Plan Note (Addendum)
Assessment: Patient with EGD 2/18, found to have hiatal hernia and non-obstructing schatzki ring. Area was dilated and fractured. Patient denies having any dysphagia since procedure occurred, however, continues to endorse decrease appetite. Suspect patient will need more time to evaluate if procedure was effective in decreasing poor appetite.  Plan: -post op esophageal dilation, dysphagia has resolved at this time

## 2020-03-15 NOTE — Assessment & Plan Note (Signed)
BP Readings from Last 3 Encounters:  03/15/20 (!) 114/95  03/08/20 (!) 146/100  02/29/20 (!) 88/62   Assessment: Patient with inconsistent blood pressure readings over the past month, at times is hypertensive, at others hypotensive. No currently on BP medications. Would suspect hypotension in setting of decreased PO intake. Seems to have isolated hypertensive episodes. Believe patient needs to be recording BP's at home and bring in log to have more accurate depiction as to what true readings are.   Prior visit patient had urine metanephrines ordered, appears 24 hr collection was not done. Will reach out to Dr. Konrad Penta to discuss further what her plans were.  Plan: -continue to monitor -encourage patient to check pressors at home -discussed for patient to return if she has lightheadedness and dizziness associated with low pressures -patient instructed to return to clinic in one month for follow up concerning hypotension

## 2020-03-19 NOTE — Progress Notes (Signed)
Internal Medicine Clinic Attending  Case discussed with Dr. Katsadouros  At the time of the visit.  We reviewed the resident's history and exam and pertinent patient test results.  I agree with the assessment, diagnosis, and plan of care documented in the resident's note.  

## 2020-04-29 ENCOUNTER — Encounter: Payer: Self-pay | Admitting: Student

## 2020-04-29 ENCOUNTER — Ambulatory Visit: Payer: Medicaid Other | Admitting: Student

## 2020-04-29 ENCOUNTER — Other Ambulatory Visit: Payer: Self-pay

## 2020-04-29 VITALS — BP 105/74 | HR 89 | Temp 98.7°F | Ht 64.0 in | Wt 98.4 lb

## 2020-04-29 DIAGNOSIS — G6289 Other specified polyneuropathies: Secondary | ICD-10-CM

## 2020-04-29 DIAGNOSIS — R413 Other amnesia: Secondary | ICD-10-CM | POA: Diagnosis not present

## 2020-04-29 DIAGNOSIS — F339 Major depressive disorder, recurrent, unspecified: Secondary | ICD-10-CM | POA: Diagnosis not present

## 2020-04-29 DIAGNOSIS — E049 Nontoxic goiter, unspecified: Secondary | ICD-10-CM | POA: Diagnosis not present

## 2020-04-29 DIAGNOSIS — E876 Hypokalemia: Secondary | ICD-10-CM

## 2020-04-29 DIAGNOSIS — E538 Deficiency of other specified B group vitamins: Secondary | ICD-10-CM

## 2020-04-29 DIAGNOSIS — Z1231 Encounter for screening mammogram for malignant neoplasm of breast: Secondary | ICD-10-CM

## 2020-04-29 DIAGNOSIS — E042 Nontoxic multinodular goiter: Secondary | ICD-10-CM | POA: Diagnosis not present

## 2020-04-29 DIAGNOSIS — D7589 Other specified diseases of blood and blood-forming organs: Secondary | ICD-10-CM | POA: Diagnosis not present

## 2020-04-29 DIAGNOSIS — R634 Abnormal weight loss: Secondary | ICD-10-CM | POA: Diagnosis not present

## 2020-04-29 DIAGNOSIS — E559 Vitamin D deficiency, unspecified: Secondary | ICD-10-CM | POA: Diagnosis not present

## 2020-04-29 DIAGNOSIS — R1310 Dysphagia, unspecified: Secondary | ICD-10-CM

## 2020-04-29 LAB — POCT GLYCOSYLATED HEMOGLOBIN (HGB A1C): Hemoglobin A1C: 4.7 % (ref 4.0–5.6)

## 2020-04-29 LAB — GLUCOSE, CAPILLARY: Glucose-Capillary: 95 mg/dL (ref 70–99)

## 2020-04-29 NOTE — Progress Notes (Signed)
CC: Distal Extremity Numbness   HPI:  [name] is a 55 y.o. underweight lady w/ PMHx GERD, dysphagia, chronic gastritis and duodenitis s/p esophageal dilation 03/08/20, multinodular goiter, MDD with psychosis, cognitive impairment, and TUD presenting for evaluation of distal extremity numbness. She states her bilateral hands and legs (up to the mid-shin) are painful, like "pins and needles" and feel numb. She states this has been a new development that's worsened over the past 3 weeks. She still feels her strength is intake in her upper extremities although feels her legs are getting weaker and says she has fallen several times recently. She does not use a cane at home and does not want to rely on using a walker. She has continued to struggle with persistent weight loss. She says she does feel a globus sensation in her throat that started about 4 days after her recent esophageal dilation; however, she is able to swallow solids, liquids, and medications. She finished her course of BID PPI. She attributes her weight loss to lack of PO intake secondary to loss of appetite, wondering whether she may benefit from an appetite stimulant. She does continue to struggle with depressed mood, although attributes this to her weight loss. She continues to have troubles with memory loss, especially with short term memory. She notes she has a chronic cough although denies any SOB, abdominal pain, nausea, vomiting, fevers, chills, CP, changes in bowel habits, night sweats, headaches, or any other symptoms.   Past Medical History:  Diagnosis Date  . ALLERGIC RHINITIS 10/10/2009   Qualifier: Diagnosis of  By: Guy Sandifer DO, Darrick Penna    . CANNABIS ABUSE 04/17/2008   Qualifier: Diagnosis of  By: Redmond Pulling  MD, Mateo Flow    . Chronic abdominal pain    Chronic Abd distension / bloating. W/U includes CT ABD & pelvis 7/08 negative. EGD 5/09 Dr Penelope Coop :erythematous gastric mucosa and bx c/w chronic gastritis - was  neg for H pylori. Duo polyp had path c/w peptic duodenitis  . Depression   . DEPRESSION 08/05/2006   Qualifier: Diagnosis of  By: Redmond Pulling  MD, Mateo Flow    . Depression   . GERD 08/05/2006   Qualifier: Diagnosis of  By: Redmond Pulling  MD, Mateo Flow    . Goiter, unspecified 10/16/2009   Qualifier: Diagnosis of  By: Guy Sandifer DO, Darrick Penna    . Hemorrhoids 01/18/2012  . Hernia of abdominal cavity   . INSOMNIA 10/15/2008   Qualifier: Diagnosis of  By: Redmond Pulling  MD, Mateo Flow    . Irritable bowel syndrome 08/06/2009   Qualifier: Diagnosis of  By: Criss Alvine    . NICOTINE ADDICTION 07/06/2008   Started on Chantix.    . 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 Spontaneous abortion    2   Social History:  Patient continues to smoke about a quarter pack of cigarettes per day.  She previously used marijuana.  Denies alcohol or other drug use.   Review of Systems:  All others negative except as noted above in HPI.   Physical Exam:  Vitals:   04/29/20 1548 04/29/20 1549  BP:  105/74  Pulse:  89  Temp:  98.7 F (37.1 C)  TempSrc:  Oral  SpO2:  100%  Weight: 98 lb 6.4 oz (44.6 kg)   Height: 5\' 4"  (1.626 m)    General: Patient is very thin. She appears chronically ill although otherwise well in no acute distress.  Eyes: Patient has a false left eye  with mild protrusion. No drainage. EOMI.  HENT: Neck is supple. MMM. No nasal discharge. Respiratory: There are rhonchi R > L which cleared after coughing. No tachypnea, respiratory distress, wheezing or rales.  Cardiovascular: Regular rate and rhythm. No murmurs, rubs, or gallops. No lower extremity edema. Radial, DP, and PT pulses are 2+ bilaterally. Extremities are warm.  MSK: Severe cachexia present throughout. Strength is 4/5 in all four extremities although equal bilaterally.  Neurological: Sensation on monofilament is absent from the tip to the base of the right 1st-4th toes and to the left tip to base of the left 2nd and  3rd toes. Otherwise, sensation is intact to monofilament testing of the bilateral feet and hands. Alert and oriented.  Abdominal: Soft and non-tender to palpation. Bowel sounds intact. No rebound or guarding. Skin: No lesions. No rashes.  Psych: Patient has a very flat affect. Normal tone of voice.   Assessment & Plan:   See Encounters Tab for problem based charting.  Patient discussed with Dr. Dareen Piano.  Jeralyn Bennett, MD 04/29/2020, 5:01 PM Pager: [phone]

## 2020-04-29 NOTE — Patient Instructions (Addendum)
Stephanie Padilla,   We will check additional blood work today to further rule out medical causes for your weight loss and new numbness in your feet.   I will also order a CT scan of your chest/abdomen/pelvis, a mammogram, and a thyroid ultrasound to rule out cancer or other pathology. You should receive a call from radiology to schedule this.   I will call you with results when they are available.   In the meantime, please be sure to call your psychologist (prescriber) to inform them your PHQ9 score is 21 and that you are still having severe depression symptoms with persistent loss of appetite. Please address whether you may benefit from adding back some of your medications.   Please also try to consume as many calories as you can. Note which foods you notice troubles swallowing with or any new symptoms you notice.   Please call [phone] to schedule a follow up appointment by next week or if you have any questions or concerns.   Take care,  Dr. Konrad Penta    Major Depressive Disorder, Adult Major depressive disorder is a mental health condition. This disorder affects feelings. It can also affect the body. Symptoms of this condition last most of the day, almost every day, for 2 weeks. This disorder can affect:  Relationships.  Daily activities, such as work and school.  Activities that you normally like to do. What are the causes? The cause of this condition is not known. The disorder is likely caused by a mix of things, including:  Your personality, such as being a shy person.  Your behavior, or how you act toward others.  Your thoughts and feelings.  Too much alcohol or drugs.  How you react to stress.  Health and mental problems that you have had for a long time.  Things that hurt you in the past (trauma).  Big changes in your life, such as divorce. What increases the risk? The following factors may make you more likely to develop this condition:  Having family  members with depression.  Being a woman.  Problems in the family.  Low levels of some brain chemicals.  Things that caused you pain as a child, especially if you lost a parent or were abused.  A lot of stress in your life, such as from: ? Living without basic needs of life, such as food and shelter. ? Being treated poorly because of race, sex, or religion (discrimination).  Health and mental problems that you have had for a long time. What are the signs or symptoms? The main symptoms of this condition are:  Being sad all the time.  Being grouchy all the time.  Loss of interest in things and activities. Other symptoms include:  Sleeping too much or too little.  Eating too much or too little.  Gaining or losing weight, without knowing why.  Feeling tired or having low energy.  Being restless and weak.  Feeling hopeless, worthless, or guilty.  Trouble thinking clearly or making decisions.  Thoughts of hurting yourself or others, or thoughts of ending your life.  Spending a lot of time alone.  Inability to complete common tasks of daily life. If you have very bad MDD, you may:  Believe things that are not true.  Hear, see, taste, or feel things that are not there.  Have mild depression that lasts for at least 2 years.  Feel very sad and hopeless.  Have trouble speaking or moving. How is this treated? This  condition may be treated with:  Talk therapy. This teaches you to know bad thoughts, feelings, and actions and how to change them. ? This can also help you to communicate with others. ? This can be done with members of your family.  Medicines. These can be used to treat worry (anxiety), depression, or low levels of chemicals in the brain.  Lifestyle changes. You may need to: ? Limit alcohol use. ? Limit drug use. ? Get regular exercise. ? Get plenty of sleep. ? Make healthy eating choices. ? Spend more time outdoors.  Brain stimulation. This  treatment excites the brain. This is done when symptoms are very bad or have not gotten better with other treatments. Follow these instructions at home: Activity  Get regular exercise as told.  Spend time outdoors as told.  Make time to do the things you enjoy.  Find ways to deal with stress. Try to: ? Meditate. ? Do deep breathing. ? Spend time in nature. ? Keep a journal.  Return to your normal activities as told by your doctor. Ask your doctor what activities are safe for you. Alcohol and drug use  If you drink alcohol: ? Limit how much you use to:  0-1 drink a day for women.  0-2 drinks a day for men. ? Be aware of how much alcohol is in your drink. In the [place] one drink equals one 12 oz bottle of beer (355 mL), one 5 oz glass of wine (148 mL), or one 1 oz glass of hard liquor (44 mL).  Talk to your doctor about: ? Alcohol use. Alcohol can affect some medicines. ? Any drug use. General instructions  Take over-the-counter and prescription medicines and herbal preparations only as told by your doctor.  Eat a healthy diet.  Get a lot of sleep.  Think about joining a support group. Your doctor may be able to suggest one.  Keep all follow-up visits as told by your doctor. This is important.   Where to find more information:  [employer] on Mental Illness: www.nami.[place]: https://carter.com/  American Psychiatric Association: www.psychiatry.org/patients-families/ Contact a doctor if:  Your symptoms get worse.  You get new symptoms. Get help right away if:  You hurt yourself.  You have serious thoughts about hurting yourself or others.  You see, hear, taste, smell, or feel things that are not there. If you ever feel like you may hurt yourself or others, or have thoughts about taking your own life, get help right away. Go to your nearest emergency department or:  Call your local emergency services (911 in the  [place]  Call a suicide crisis helpline, such as the [facility] at (520)155-6779. This is open 24 hours a day in the [place]  Text the Crisis Text Line at 317-126-6915 (in the [place].). Summary  Major depressive disorder is a mental health condition. This disorder affects feelings. Symptoms of this condition last most of the day, almost every day, for 2 weeks.  The symptoms of this disorder can cause problems with relationships and with daily activities.  There are treatments and support for people who get this disorder. You may need more than one type of treatment.  Get help right away if you have serious thoughts about hurting yourself or others. This information is not intended to replace advice given to you by your health care provider. Make sure you discuss any questions you have with your health care provider. Document Revised:  12/17/2018 Document Reviewed: 12/17/2018 Elsevier Patient Education  [place].

## 2020-04-30 ENCOUNTER — Telehealth: Payer: Self-pay

## 2020-04-30 ENCOUNTER — Other Ambulatory Visit: Payer: Self-pay | Admitting: Student

## 2020-04-30 ENCOUNTER — Telehealth: Payer: Self-pay | Admitting: Student

## 2020-04-30 DIAGNOSIS — G6289 Other specified polyneuropathies: Secondary | ICD-10-CM

## 2020-04-30 DIAGNOSIS — E559 Vitamin D deficiency, unspecified: Secondary | ICD-10-CM

## 2020-04-30 DIAGNOSIS — E876 Hypokalemia: Secondary | ICD-10-CM

## 2020-04-30 LAB — CBC WITH DIFFERENTIAL/PLATELET
Basophils Absolute: 0 10*3/uL (ref 0.0–0.2)
Basos: 0 %
EOS (ABSOLUTE): 0 10*3/uL (ref 0.0–0.4)
Eos: 0 %
Hematocrit: 48.2 % — ABNORMAL HIGH (ref 34.0–46.6)
Hemoglobin: 16.3 g/dL — ABNORMAL HIGH (ref 11.1–15.9)
Immature Grans (Abs): 0 10*3/uL (ref 0.0–0.1)
Immature Granulocytes: 0 %
Lymphocytes Absolute: 2.7 10*3/uL (ref 0.7–3.1)
Lymphs: 44 %
MCH: 33.3 pg — ABNORMAL HIGH (ref 26.6–33.0)
MCHC: 33.8 g/dL (ref 31.5–35.7)
MCV: 98 fL — ABNORMAL HIGH (ref 79–97)
Monocytes Absolute: 0.5 10*3/uL (ref 0.1–0.9)
Monocytes: 8 %
Neutrophils Absolute: 2.9 10*3/uL (ref 1.4–7.0)
Neutrophils: 48 %
Platelets: 338 10*3/uL (ref 150–450)
RBC: 4.9 x10E6/uL (ref 3.77–5.28)
RDW: 13.3 % (ref 11.7–15.4)
WBC: 6.2 10*3/uL (ref 3.4–10.8)

## 2020-04-30 LAB — VITAMIN D 25 HYDROXY (VIT D DEFICIENCY, FRACTURES): Vit D, 25-Hydroxy: 16.2 ng/mL — ABNORMAL LOW (ref 30.0–100.0)

## 2020-04-30 LAB — CMP14 + ANION GAP
ALT: 11 IU/L (ref 0–32)
AST: 23 IU/L (ref 0–40)
Albumin/Globulin Ratio: 2.6 — ABNORMAL HIGH (ref 1.2–2.2)
Albumin: 4.5 g/dL (ref 3.8–4.9)
Alkaline Phosphatase: 83 IU/L (ref 44–121)
Anion Gap: 20 mmol/L — ABNORMAL HIGH (ref 10.0–18.0)
BUN/Creatinine Ratio: 8 — ABNORMAL LOW (ref 9–23)
BUN: 5 mg/dL — ABNORMAL LOW (ref 6–24)
Bilirubin Total: 0.5 mg/dL (ref 0.0–1.2)
CO2: 22 mmol/L (ref 20–29)
Calcium: 9.7 mg/dL (ref 8.7–10.2)
Chloride: 98 mmol/L (ref 96–106)
Creatinine, Ser: 0.62 mg/dL (ref 0.57–1.00)
Globulin, Total: 1.7 g/dL (ref 1.5–4.5)
Glucose: 104 mg/dL — ABNORMAL HIGH (ref 65–99)
Potassium: 3.3 mmol/L — ABNORMAL LOW (ref 3.5–5.2)
Sodium: 140 mmol/L (ref 134–144)
Total Protein: 6.2 g/dL (ref 6.0–8.5)
eGFR: 107 mL/min/{1.73_m2} (ref >59)

## 2020-04-30 LAB — HIV ANTIBODY (ROUTINE TESTING W REFLEX): HIV Screen 4th Generation wRfx: NONREACTIVE

## 2020-04-30 LAB — VITAMIN B12: Vitamin B-12: 577 pg/mL (ref 232–1245)

## 2020-04-30 MED ORDER — POTASSIUM CHLORIDE ER 20 MEQ PO TBCR: 20.0000 meq | EXTENDED_RELEASE_TABLET | Freq: Every day | ORAL | 0 refills | Status: DC

## 2020-04-30 MED ORDER — GABAPENTIN 100 MG PO CAPS: ORAL_CAPSULE | ORAL | 0 refills | Status: DC

## 2020-04-30 MED ORDER — VITAMIN D 25 MCG (1000 UNIT) PO TABS: 1000.0000 [IU] | ORAL_TABLET | Freq: Every day | ORAL | 0 refills | Status: DC

## 2020-04-30 NOTE — Telephone Encounter (Signed)
Called Stephanie Padilla back to discuss her medications and her results. Her vitamin D was low at 16.2, consistent with insufficiency and her potassium was slightly low at 3.3, both chronic and likely due to malnutrition. Her HIV test was negative and her A1c was <5 so DM is not leading to her weight loss. She did have elevated Hgb and anion gap although this is likely in the setting of dehydration and poor PO intake. She is wondering if she can have an appetite stimulator. Her mirtazapine was discontinued due to concern for serotonin syndrome. She corrects that she is currently taking olanzapine, venlafaxine, and hydroxyzine. She does not ever recall taking Prozac. She says she told her psychiatrist she was told to stop taking her current medications as [facility] was concerned they were causing her loss of appetite. Informed her that this was our concern, although recommended she reach out to her psychiatrist @ [facility] for an urgent appointment to discuss which medications she may benefit from given her ongoing severe depression (PHQ9 of 21) and possible medication side effects.   Plan:  - Prescribed vitamin D 1000 units daily x 90 days - Will require repeat vitamin D-25OH testing in 3 months  - Prescribed K-Dur 59mEq PO daily; will repeat CMP next visit  - Prescribed gabapentin 100mg  nightly for neuropathy; may increase to TID PRN if she experiences symptomatic improvement - She has not yet received a call from radiology to schedule CT scans or thyroid ultrasound  - She states she will reach out to psychiatry to discuss possible medications changes   Jeralyn Bennett, MD 04/30/2020, 3:38 PM Pager: [phone]

## 2020-04-30 NOTE — Telephone Encounter (Signed)
Return pt's call who stated she was seen yesterday (by Dr Konrad Penta) and the doctor suppose to order med for the numbness in her hands/feet. She went to the pharmacy but it as not there. Send rx to [facility] on [address] Thanks

## 2020-04-30 NOTE — Telephone Encounter (Signed)
Requesting to speak with a nurse about meds, please call pt back.  

## 2020-05-01 NOTE — Assessment & Plan Note (Signed)
Thyroid ultrasound was ordered last visit to follow up multinodular goiter in the setting of dysphagia; however, patient says she did not receive a call to schedule this. Last FNA was in 2019 which was benign; however, she was lost to follow up with endocrinology after that time.   - Reordered thyroid ultrasound

## 2020-05-01 NOTE — Assessment & Plan Note (Signed)
EGD 03/08/20 showed moderate stenosis of the esophagus that was dilated (with mild mucosal tear and moderate improvement), mild Schatski's ring which was dilated and fractured, a 2cm hiatal hernia, and peptic duodenitis. She finished a 6 week course of Prilosec 20mg  BID. She continues to deny symptoms of heartburn or dysphagia since her procedure although notes she has had a globus sensation in her throat that started 4 days after the procedure.   - Will discuss restarting prilosec daily at follow up visit  - Symptoms may be in the setting of persistent esophageal stenosis - Will consider referring back to GI if symptoms persist

## 2020-05-01 NOTE — Assessment & Plan Note (Signed)
Pain and tingling of all four distal extremities with absent sensation to monofilament testing on medial aspects of the bilateral feet are consistent with peripheral neuropathy. Hgb A1c and vitamin B12 were normal. Recent thyroid testing was unremarkable and patient denies other systemic symptoms other than weakness in her legs with recent falls. HIV negative. Unclear cause of patient's symptoms at this time; however it is noted patient has had troubles with cervical radiculopathy in the past (despite acute nature of complaints).   - Will trial low dose gabapentin, 100mg  nightly titrated up to TID PRN  - Patient will require assistive walking device with monitoring of symptoms/falls

## 2020-05-01 NOTE — Progress Notes (Signed)
Internal Medicine Clinic Attending  Case discussed with Dr. Speakman  At the time of the visit.  We reviewed the resident's history and exam and pertinent patient test results.  I agree with the assessment, diagnosis, and plan of care documented in the resident's note.  

## 2020-05-01 NOTE — Assessment & Plan Note (Addendum)
Patient's MOCA was recently found by Dr. Alease Frame to be low at 17 with self-reported acute-onset memory troubles, especially with short term memory, noted by family. She has also had worsening agitation and depression with psychosis. Findings are concerning for dementia (Alzheimer's vs. Lewy Body dementia) - patient lacks tremor.   - Would benefit from neurological evaluation in the near future if physiologic cause (especially malignancy) cannot explain her symptoms

## 2020-05-01 NOTE — Assessment & Plan Note (Signed)
Patient continues to struggle with weight loss, now weighing only 98 lbs with severe cachexia. She did endorse frequent urination and thirst; however, Hgb A1c was on low end of normal. HIV testing was negative. She has had recent normal TSH testing. This may be in the setting of severe depression; however, need to rule out other causes including malignancy, especially with significant smoking history.   - Check CT Chest/Abdomen/Pelvis w/ contrast  - Check thyroid ultrasound  - May require referral back to GI for repeat endoscopy if globus sensation worsens or dysphagia recurs  - May benefit from neurological evaluation if above are unremarkable

## 2020-05-01 NOTE — Assessment & Plan Note (Signed)
Potassium today was 3.3. Patient has a long history of hypokalemia, which may be in the setting of malnutrition. She has not been taking potassium supplementation since following up with GI.  - Restart KCl 22mEq PO daily  - Check electrolytes at follow up visit

## 2020-05-01 NOTE — Assessment & Plan Note (Signed)
Vitamin D remains low today, < 20. She had not picked up OTC vitamin D supplements after finishing weekly supplementation. Likely in setting of poor PO intake.   - Prescribed vitamin D 1000 units daily x 3 months  - Will require repeat vitamin D levels in 3 months

## 2020-05-01 NOTE — Assessment & Plan Note (Signed)
Patient follows with a therapist and psychiatrist at [facility]. She continues to struggle with depressed mood and I wonder whether this may be a large contributor of her loss of appetite and weight loss, especially in the setting of similar episode(s) in the past. She says she continues to take Olanzapine, Venlafaxine, and Hydroxyzine daily. She stopped taking mirtazapine 15mg  daily as instructed during her last [facility] appointment due to concerns for serotonin syndrome. She says she is not due to follow back up with [facility] for several months for routine follow up; however, PHQ-9 today is 21.   - Patient would benefit from urgent follow up with Psychiatry  - Instructed her to call [facility] to schedule an appointment to discuss severe depression and see if any of her medications may be contributing to her symptoms / if she may benefit from alterations in therapy  - Consider lower dose Mirtazapine in the future for appetite stimulation

## 2020-05-02 ENCOUNTER — Encounter: Payer: Medicaid Other | Admitting: Student

## 2020-05-02 ENCOUNTER — Ambulatory Visit (HOSPITAL_COMMUNITY): Payer: Medicaid Other | Attending: Internal Medicine

## 2020-05-07 ENCOUNTER — Other Ambulatory Visit: Payer: Self-pay | Admitting: Internal Medicine

## 2020-05-07 ENCOUNTER — Encounter: Payer: Medicaid Other | Admitting: Internal Medicine

## 2020-05-07 DIAGNOSIS — Z1231 Encounter for screening mammogram for malignant neoplasm of breast: Secondary | ICD-10-CM

## 2020-05-08 ENCOUNTER — Other Ambulatory Visit: Payer: Self-pay

## 2020-05-08 ENCOUNTER — Encounter: Payer: Self-pay | Admitting: Internal Medicine

## 2020-05-08 ENCOUNTER — Ambulatory Visit: Payer: Medicaid Other | Admitting: Internal Medicine

## 2020-05-08 VITALS — BP 116/74 | HR 110 | Temp 98.2°F | Ht 64.0 in | Wt 95.8 lb

## 2020-05-08 DIAGNOSIS — R413 Other amnesia: Secondary | ICD-10-CM

## 2020-05-08 DIAGNOSIS — R634 Abnormal weight loss: Secondary | ICD-10-CM | POA: Diagnosis not present

## 2020-05-08 DIAGNOSIS — E876 Hypokalemia: Secondary | ICD-10-CM | POA: Diagnosis not present

## 2020-05-08 DIAGNOSIS — I10 Essential (primary) hypertension: Secondary | ICD-10-CM

## 2020-05-08 DIAGNOSIS — R1310 Dysphagia, unspecified: Secondary | ICD-10-CM | POA: Diagnosis not present

## 2020-05-08 DIAGNOSIS — R630 Anorexia: Secondary | ICD-10-CM

## 2020-05-08 NOTE — Progress Notes (Signed)
   CC: weight loss  HPI:  [name] is a 55 y.o. female with PMHx as stated below presenting for follow up of her ongoing weight loss. She was previously evaluated for this on 4/11 at which time work up for possible malignancy was pursued including CT Chest/Abdomen/Pelvis and thyroid US. Patient has not been able to schedule this yet. She does note persistent weight loss despite trying to drink more Ensures. She notes drinking 5-6 per day to try to keep her protein intake up; however, weighs 95lbs today. Please see problem based charting for complete assessment and plan.  Past Medical History:  Diagnosis Date  . ALLERGIC RHINITIS 10/10/2009   Qualifier: Diagnosis of  By: Guy Sandifer DO, Darrick Penna    . CANNABIS ABUSE 04/17/2008   Qualifier: Diagnosis of  By: Redmond Pulling  MD, Mateo Flow    . Chronic abdominal pain    Chronic Abd distension / bloating. W/U includes CT ABD & pelvis 7/08 negative. EGD 5/09 Dr Penelope Coop :erythematous gastric mucosa and bx c/w chronic gastritis - was neg for H pylori. Duo polyp had path c/w peptic duodenitis  . Depression   . DEPRESSION 08/05/2006   Qualifier: Diagnosis of  By: Redmond Pulling  MD, Mateo Flow    . Depression   . GERD 08/05/2006   Qualifier: Diagnosis of  By: Redmond Pulling  MD, Mateo Flow    . Goiter, unspecified 10/16/2009   Qualifier: Diagnosis of  By: Guy Sandifer DO, Darrick Penna    . Hemorrhoids 01/18/2012  . Hernia of abdominal cavity   . INSOMNIA 10/15/2008   Qualifier: Diagnosis of  By: Redmond Pulling  MD, Mateo Flow    . Irritable bowel syndrome 08/06/2009   Qualifier: Diagnosis of  By: Criss Alvine    . NICOTINE ADDICTION 07/06/2008   Started on Chantix.    . 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 Spontaneous abortion    2   Review of Systems:  Negative except as stated in HPI.  Physical Exam:  There were no vitals filed for this visit. Physical Exam  Constitutional: Thin appearing female. Appears older than stated age. No distress.   HENT: Normocephalic and atraumatic, False left eye with mild protrusion; EOMI Cardiovascular: Normal rate, regular rhythm, S1 and S2 present, no murmurs, rubs, gallops.  Distal pulses intact Respiratory: No respiratory distress,  Effort is normal.  Lungs are clear to auscultation bilaterally. GI: Nondistended, soft, nontender to palpation, active bowel sounds Musculoskeletal: Diffuse muscle wasting; strength 4/5 in all extremities; no peripheral edema Neurological: Is alert and oriented x4, no apparent focal deficits noted. Skin: Warm and dry.  No rash, erythema, lesions noted. Psychiatric: Normal mood and affect. Behavior is normal. Judgment and thought content normal.   Assessment & Plan:   See Encounters Tab for problem based charting.  Patient discussed with Dr. Jimmye Norman

## 2020-05-08 NOTE — Patient Instructions (Signed)
Ms Stephanie Padilla,  It was a pleasure seeing you in clinic. Today we discussed:   Weight loss:  I am checking on some labs today. I will call you with any abnormal results. In the mean time, I will follow up on the scheduling for your thyroid ultrasound and CT scans with radiology. Please continue to have as much oral intake as possible.   If you have any questions or concerns, please call our clinic at [phone] between 9am-5pm and after hours call [phone] and ask for the internal medicine resident on call. If you feel you are having a medical emergency please call 911.   Thank you, we look forward to helping you remain healthy!

## 2020-05-09 LAB — CBC
Hematocrit: 48.8 % — ABNORMAL HIGH (ref 34.0–46.6)
Hemoglobin: 16.4 g/dL — ABNORMAL HIGH (ref 11.1–15.9)
MCH: 32.9 pg (ref 26.6–33.0)
MCHC: 33.6 g/dL (ref 31.5–35.7)
MCV: 98 fL — ABNORMAL HIGH (ref 79–97)
Platelets: 267 10*3/uL (ref 150–450)
RBC: 4.99 x10E6/uL (ref 3.77–5.28)
RDW: 13.1 % (ref 11.7–15.4)
WBC: 5.5 10*3/uL (ref 3.4–10.8)

## 2020-05-09 LAB — CMP14 + ANION GAP
ALT: 14 IU/L (ref 0–32)
AST: 18 IU/L (ref 0–40)
Albumin/Globulin Ratio: 2.3 — ABNORMAL HIGH (ref 1.2–2.2)
Albumin: 4.3 g/dL (ref 3.8–4.9)
Alkaline Phosphatase: 74 IU/L (ref 44–121)
Anion Gap: 20 mmol/L — ABNORMAL HIGH (ref 10.0–18.0)
BUN/Creatinine Ratio: 9 (ref 9–23)
BUN: 5 mg/dL — ABNORMAL LOW (ref 6–24)
Bilirubin Total: 0.2 mg/dL (ref 0.0–1.2)
CO2: 24 mmol/L (ref 20–29)
Calcium: 9.5 mg/dL (ref 8.7–10.2)
Chloride: 98 mmol/L (ref 96–106)
Creatinine, Ser: 0.57 mg/dL (ref 0.57–1.00)
Globulin, Total: 1.9 g/dL (ref 1.5–4.5)
Glucose: 90 mg/dL (ref 65–99)
Potassium: 3.3 mmol/L — ABNORMAL LOW (ref 3.5–5.2)
Sodium: 142 mmol/L (ref 134–144)
Total Protein: 6.2 g/dL (ref 6.0–8.5)
eGFR: 109 mL/min/{1.73_m2} (ref >59)

## 2020-05-13 NOTE — Assessment & Plan Note (Signed)
Patient with persistently low appetite without improvement since her EGD with esophageal dilation. She was trialed on omeprazole; however, denies any significant improvement with this. Her mirtazapine was discontinued at prior visit in February 2022 to decrease risk for serotonin syndrome. She is currently on olanzapine and venlafaxine for antidepressants. She endorses drinking 5-6 Ensures throughout the day to try to maintain her protein/calorie intake; however, continues to lose weight.   Plan:  Continue to encourage oral intake Consider referral to GI for persistent globus sensation

## 2020-05-13 NOTE — Assessment & Plan Note (Signed)
Patient noted to have trouble with memory since early this year with most recent MOCA score of 17. MRI brain obtained without any acute abnormalities but did show chronic microvascular ischemic changes, demyelination and post inflammatory/infectious Process. Repeat MOCA at this visit 13. She currently lives by herself but has a daughter and two sons that are very supportive and check on her often.  Will continue to evaluate for possible malignant etiology of patient's symptoms. However, if negative, will need to have family discussion to discuss goals of care and possibility of supervised program to ensure that patient is maintaining adequate oral intake in setting of worsening dementia.

## 2020-05-13 NOTE — Assessment & Plan Note (Signed)
BP Readings from Last 3 Encounters:  05/08/20 116/74  04/29/20 105/74  03/15/20 (!) 114/95   Patient's BP over the past several visits have been low over the past several visits in setting of decreased oral intake. She is not on any antihypertensives at this time. She denies any lightheadedness/dizziness, chest pain, palpitations or focal weakness at this time. She was previously advised to keep a log of her BP's at home. However, she has not been able to do so.  Plan: Continue to monitor Suspect her hypotension is in setting of decreased oral intake; will advise her to increase intake as able. Work up for weight loss as below

## 2020-05-13 NOTE — Assessment & Plan Note (Signed)
BMP Latest Ref Rng & Units 05/08/2020 04/29/2020 02/15/2020  Glucose 65 - 99 mg/dL 90 104(H) 97  BUN 6 - 24 mg/dL 5(L) 5(L) 7  Creatinine 0.57 - 1.00 mg/dL 0.57 0.62 0.50(L)  BUN/Creat Ratio 9 - 23 9 8(L) 14  Sodium 134 - 144 mmol/L 142 140 143  Potassium 3.5 - 5.2 mmol/L 3.3(L) 3.3(L) 3.1(L)  Chloride 96 - 106 mmol/L 98 98 96  CO2 20 - 29 mmol/L 24 22 29   Calcium 8.7 - 10.2 mg/dL 9.5 9.7 9.5   Persistent hypokalemia in setting of malnutrition.   Plan: Continue KCl 29mEq daily F/u BMP at next visit

## 2020-05-13 NOTE — Progress Notes (Signed)
Internal Medicine Clinic Attending ° °Case discussed with Dr. Aslam  At the time of the visit.  We reviewed the resident’s history and exam and pertinent patient test results.  I agree with the assessment, diagnosis, and plan of care documented in the resident’s note.  °

## 2020-05-13 NOTE — Assessment & Plan Note (Signed)
Patient is being evaluated for persistent weight loss today, now weighing 95lbs (down from 98lbs 2 weeks ago). Work up thus far including HbA1c, HIV, and TSH have been normal. She continues to endorse a globus sensation to solids in the mid-sternum but is able to tolerate liquids. She is drinking 5-6 Ensures daily.  Patient was referred for CT Chest/Abd/Pelvis and thyroid US at her last visit; however, she has been unable to get this done yet. I reiterated importance of these exams to evaluate for any malignancy. She expresses understanding and will schedule these.   Plan: F/u CT Chest/Abd/Pelvis and thyroid US Consider referral to GI for repeat endoscopy for persistent globus sensation

## 2020-05-15 ENCOUNTER — Ambulatory Visit (HOSPITAL_COMMUNITY): Payer: Medicaid Other

## 2020-05-15 ENCOUNTER — Other Ambulatory Visit (HOSPITAL_COMMUNITY): Payer: Medicaid Other

## 2020-05-16 ENCOUNTER — Other Ambulatory Visit: Payer: Self-pay

## 2020-05-16 ENCOUNTER — Other Ambulatory Visit (HOSPITAL_COMMUNITY): Payer: Self-pay | Admitting: Student

## 2020-05-16 ENCOUNTER — Ambulatory Visit (HOSPITAL_COMMUNITY)
Admission: RE | Admit: 2020-05-16 | Discharge: 2020-05-16 | Disposition: A | Discharge status: Home / Self Care | Payer: Medicaid Other | Source: Ambulatory Visit | Attending: Internal Medicine | Admitting: Internal Medicine

## 2020-05-16 DIAGNOSIS — R634 Abnormal weight loss: Secondary | ICD-10-CM | POA: Insufficient documentation

## 2020-05-16 DIAGNOSIS — E042 Nontoxic multinodular goiter: Secondary | ICD-10-CM | POA: Insufficient documentation

## 2020-05-16 MED ORDER — IOHEXOL 300 MG/ML  SOLN
100.0000 mL | Freq: Once | INTRAMUSCULAR | Status: AC | PRN
  Administered 2020-05-16: 90 mL via INTRAVENOUS

## 2020-05-17 ENCOUNTER — Telehealth: Payer: Self-pay | Admitting: Student

## 2020-05-17 DIAGNOSIS — I700 Atherosclerosis of aorta: Secondary | ICD-10-CM

## 2020-05-17 DIAGNOSIS — D259 Leiomyoma of uterus, unspecified: Secondary | ICD-10-CM

## 2020-05-17 NOTE — Telephone Encounter (Signed)
Called Ms. Manton to reassure her that her CT scan results were overall reassuring and very unlikely to explain her weight loss. She did have a 13mm irregular R lung lesion in the apex that radiology recommend repeat non-con CT chest for in 1 year and she did have multiple hepatic cysts (also on previous CT scan), a 2.5cm likely uterine fibroid, and aortic atherosclerosis, although patient denies any complaints. She says she has been doing "so-so", continues to try to eat despite decreased appetite, and says swallowing is currently "okay". Has an appointment scheduled with [facility] upcoming and an appointment with her PCP next month.   - Due for repeat non-con CT chest in 1 year for nodule follow up  - No other clear explanation for weight loss; likely 2/2 depression vs. Underlying dementia  - Recommended starting baby ASA 81mg  daily  - Consider repeat MOCA testing next visit  - Be sure psychiatry is aware of severe depression and concerns for medication side effects - F/U next month   Jeralyn Bennett, MD 05/17/2020, 3:09 PM Pager: [phone]

## 2020-06-03 ENCOUNTER — Other Ambulatory Visit: Payer: Self-pay

## 2020-06-03 ENCOUNTER — Encounter: Payer: Self-pay | Admitting: Student

## 2020-06-03 ENCOUNTER — Ambulatory Visit: Payer: Medicaid Other | Admitting: Student

## 2020-06-03 VITALS — BP 103/92 | HR 95 | Temp 98.5°F | Ht 64.0 in | Wt 98.5 lb

## 2020-06-03 DIAGNOSIS — F323 Major depressive disorder, single episode, severe with psychotic features: Secondary | ICD-10-CM | POA: Diagnosis not present

## 2020-06-03 DIAGNOSIS — R634 Abnormal weight loss: Secondary | ICD-10-CM

## 2020-06-03 DIAGNOSIS — G6289 Other specified polyneuropathies: Secondary | ICD-10-CM

## 2020-06-03 DIAGNOSIS — D7589 Other specified diseases of blood and blood-forming organs: Secondary | ICD-10-CM

## 2020-06-03 DIAGNOSIS — E876 Hypokalemia: Secondary | ICD-10-CM

## 2020-06-03 DIAGNOSIS — E559 Vitamin D deficiency, unspecified: Secondary | ICD-10-CM

## 2020-06-03 MED ORDER — GABAPENTIN 300 MG PO CAPS: 300.0000 mg | ORAL_CAPSULE | Freq: Two times a day (BID) | ORAL | 2 refills | Status: DC

## 2020-06-03 NOTE — Assessment & Plan Note (Addendum)
Persistent microcytosis since 5 months ago without anemia. B12 was normal.  Patient is at risk for B12 and folate deficiency given her poor p.o. intake.  -Continue to monitor

## 2020-06-03 NOTE — Assessment & Plan Note (Addendum)
Last potassium check was 3.3.  Patient is taking potassium supplement.  -Will repeat BMP to check potassium level given her recent diarrhea -Will also obtain morning cortisol to check for adrenal insufficiency -Continue potassium supplement  Addendum BMP showed potassium 3.5, stable. AM cortisol WNL which rules out adrenal insufficiency.

## 2020-06-03 NOTE — Progress Notes (Signed)
CC: Poor appetite, oropharyngeal globus sensation  HPI:  [name] is a 55 y.o. with past medical history of depression, vitamin D deficiency, who presented to the clinic for follow-up on her weight loss, poor appetite and oropharyngeal globus sensation.    Please see problem based charting for details  Past Medical History:  Diagnosis Date  . ALLERGIC RHINITIS 10/10/2009   Qualifier: Diagnosis of  By: Guy Sandifer DO, Darrick Penna    . CANNABIS ABUSE 04/17/2008   Qualifier: Diagnosis of  By: Redmond Pulling  MD, Mateo Flow    . Chronic abdominal pain    Chronic Abd distension / bloating. W/U includes CT ABD & pelvis 7/08 negative. EGD 5/09 Dr Penelope Coop :erythematous gastric mucosa and bx c/w chronic gastritis - was neg for H pylori. Duo polyp had path c/w peptic duodenitis  . Depression   . DEPRESSION 08/05/2006   Qualifier: Diagnosis of  By: Redmond Pulling  MD, Mateo Flow    . Depression   . GERD 08/05/2006   Qualifier: Diagnosis of  By: Redmond Pulling  MD, Mateo Flow    . Goiter, unspecified 10/16/2009   Qualifier: Diagnosis of  By: Guy Sandifer DO, Darrick Penna    . Hemorrhoids 01/18/2012  . Hernia of abdominal cavity   . INSOMNIA 10/15/2008   Qualifier: Diagnosis of  By: Redmond Pulling  MD, Mateo Flow    . Irritable bowel syndrome 08/06/2009   Qualifier: Diagnosis of  By: Criss Alvine    . NICOTINE ADDICTION 07/06/2008   Started on Chantix.    . 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 Spontaneous abortion    2   Review of Systems: As per HPI  Physical Exam:  Vitals:   06/03/20 1032  BP: (!) 103/92  Pulse: 95  Temp: 98.5 F (36.9 C)  TempSrc: Oral  SpO2: 100%  Weight: 98 lb 8 oz (44.7 kg)  Height: 5\' 4"  (1.626 m)   Physical Exam Constitutional:      General: She is not in acute distress.    Appearance: She is not toxic-appearing.     Comments: frail  HENT:     Head: Normocephalic.  Eyes:     General: No scleral icterus.       Right eye: No discharge.        Left eye: No  discharge.     Conjunctiva/sclera: Conjunctivae normal.  Neck:     Comments: Neck appears normal.  No thyroid enlargement palpated.  No cervical lymphadenopathy.  No pain to palpation. Cardiovascular:     Rate and Rhythm: Normal rate and regular rhythm.     Heart sounds: Normal heart sounds. No murmur heard.     Comments: No LE edema Pulmonary:     Effort: Pulmonary effort is normal. No respiratory distress.     Breath sounds: No wheezing.  Abdominal:     General: Bowel sounds are normal. There is no distension.     Tenderness: There is no abdominal tenderness.  Musculoskeletal:        General: Normal range of motion.     Comments: Feet warm to touch.   +2 Pedis pulses palpated  Skin:    General: Skin is warm.     Coloration: Skin is not jaundiced.  Neurological:     Mental Status: She is alert and oriented to person, place, and time.     Comments: No cranial nerve deficit on neuro exam  Psychiatric:     Comments: Flat affect.  Speaking in monotone  Assessment & Plan:   See Encounters Tab for problem based charting.  Patient discussed with Dr. Philipp Ovens

## 2020-06-03 NOTE — Assessment & Plan Note (Signed)
Recheck in 3 months.

## 2020-06-03 NOTE — Assessment & Plan Note (Addendum)
Patient presents to clinic today to follow-up on her weight loss and poor appetite.  Her weight dropped from 95 to 93 pounds since last visit 4 weeks ago.  Still endorses poor appetite.  She also reports persistent globus sensation of the oropharyngeal area.  States that she does not have issue chewing food.  However she felt the obstruction when swallow food in the upper cervical area.  Denies odynophagia, nausea or vomiting.  Denies any acid reflux symptoms.  States that she has been drinking 6 bottles of Ensure a day.  She only eats crackers and snacks.  Patient does not cook at home.  Patient endorses diarrhea that started 1 month ago.  She thought it is related to her increased intake of Ensure.  Reports diarrhea right after drinking Ensure.  Denies blood in stool or melena.  States that stool is dark brown and foul-smelling.  Assessment and plan Cancer was unlikely to be the cause of her weight loss given unremarkable CT chest/abdomen and pelvis.  Thyroid ultrasound was also unremarkable.  Last EGD on February showed esophageal stenosis and Schatzki ring which was dilated.  Given her persistent globus sensation and continued weight loss, advised patient to follow-up with GI for further evaluation.  Continue Prilosec daily.  Her diarrhea is likely lactose intolerance related to her increased intake of Ensure.  Will obtain fecal fat and fecal elastase to rule out chronic pancreatic insufficiency.  Her depression and memory issue can also play a role in her poor appetite.  We will try to rule out any underlying organic causes.  If things ruled out, we will further evaluate her behavioral health.  -Follow-up with GI for persistent globus sensation -Pending fecal fat and fecal elastase to rule out chronic pancreatic insufficiency  -Encourage p.o. intake  Addendum Fecal fat and fecal elastase require formed stool to run the test. Advise patient to hold Ensure for 1 day to see if diarrhea improves.  She will call the clinic if diarrhea persists.

## 2020-06-03 NOTE — Assessment & Plan Note (Signed)
Patient report numbness and tingling sensation of bilateral lower extremity from her knees down.  States that the feeling is constant.  Patient was started on gabapentin last visit for peripheral neuropathy.  States up to medication originally helped but the effect has decreased.    -Will increase gabapentin to 300 mg twice daily

## 2020-06-03 NOTE — Assessment & Plan Note (Signed)
Patient has longstanding history of of depression.  She is seeing [facility] health, who prescribed Effexor and Prozac.  Mirtazapine will discontinue last visit due to concern for serotonin syndrome.  Patient states that she just had a visit with [facility] provider a few days ago and no adjustment was made in her medications.  Her PHQ-9 was 21 today.  Assessment and plan Depression remains uncontrolled with PHQ-9 of 21.  Patient denies suicidal thoughts.  Her depression might play a role in her weight loss and poor appetite.  After all the organic causes are ruled out, we will need to further evaluate the relationship of of her depression/memory loss to her poor appetite.  -Continue Prozac and Effexor -Follow-up with [facility]

## 2020-06-03 NOTE — Patient Instructions (Addendum)
Ms. Tegeler,  It was nice seeing you in the clinic today.  I am sorry that your poor appetite still not improving.   For the trouble swallowing food, please call the gastroenterologist to schedule a follow-up appointment.  [facility] [phone]  I will also check the stool for any conditions that can cause diarrhea.  Please come back on Friday at 8 AM to get your blood test.  Take care  Dr. Alfonse Spruce

## 2020-06-04 NOTE — Progress Notes (Signed)
Internal Medicine Clinic Attending  Case discussed with Dr. Alfonse Spruce  At the time of the visit.  We reviewed the resident's history and exam and pertinent patient test results.  I agree with the assessment, diagnosis, and plan of care documented in the resident's note.  Patient here for follow up of ongoing, unintentional weight loss. She is underweight with a BMI of 16.9.  CT chest / abdomen / pelvis last month showed no evidence of malignancy. 5 mm right apical nodule noted with recommendations for 12 month follow up. She was seen by GI for symptoms of dysphagia. Found to have a non obstructing proximal area of stenosis now s/p dilation. She had a colonoscopy in 2018 which was completely unremarkable.   She is now complaining of diarrhea on going for a few weeks. Previously had chronic constipation. This is likely related to her ensure drinks, but we are checking fecal fat and fecal elastase for evaluation pancreatic insufficiency, as this can present with either constipation or diarrhea. Consider further work up for diarrhea if symptoms persist. Given chronic hypokalemia, we are also checking AM cortisol to screen for adrenal insufficiency.

## 2020-06-07 ENCOUNTER — Other Ambulatory Visit (INDEPENDENT_AMBULATORY_CARE_PROVIDER_SITE_OTHER): Payer: Medicaid Other

## 2020-06-07 DIAGNOSIS — E876 Hypokalemia: Secondary | ICD-10-CM

## 2020-06-10 LAB — BMP8+ANION GAP
Anion Gap: 16 mmol/L (ref 10.0–18.0)
BUN/Creatinine Ratio: 10 (ref 9–23)
BUN: 5 mg/dL — ABNORMAL LOW (ref 6–24)
CO2: 26 mmol/L (ref 20–29)
Calcium: 9.3 mg/dL (ref 8.7–10.2)
Chloride: 99 mmol/L (ref 96–106)
Creatinine, Ser: 0.52 mg/dL — ABNORMAL LOW (ref 0.57–1.00)
Glucose: 86 mg/dL (ref 65–99)
Potassium: 3.5 mmol/L (ref 3.5–5.2)
Sodium: 141 mmol/L (ref 134–144)
eGFR: 112 mL/min/{1.73_m2} (ref >59)

## 2020-06-10 LAB — CORTISOL-AM, BLOOD: Cortisol - AM: 18.5 ug/dL (ref 6.2–19.4)

## 2020-07-11 ENCOUNTER — Other Ambulatory Visit: Payer: Self-pay

## 2020-07-11 ENCOUNTER — Ambulatory Visit
Admission: RE | Admit: 2020-07-11 | Discharge: 2020-07-11 | Disposition: A | Discharge status: Home / Self Care | Payer: Medicaid Other | Source: Ambulatory Visit | Attending: Internal Medicine | Admitting: Internal Medicine

## 2020-07-11 DIAGNOSIS — Z1231 Encounter for screening mammogram for malignant neoplasm of breast: Secondary | ICD-10-CM

## 2020-07-23 ENCOUNTER — Encounter: Payer: Self-pay | Admitting: *Deleted

## 2020-09-20 ENCOUNTER — Telehealth: Payer: Self-pay | Admitting: Internal Medicine

## 2020-09-20 NOTE — Telephone Encounter (Signed)
Patient with outstanding labwork for stool samples.   Called and spoke with Ms. Deroos.  Confirmed identity with name and birthdate.  Discussed outstanding stool samples and reason for testing.  She reports same symptoms that she had in May.  She now has formed stools and no further diarrhea.  She would like to follow through with testing.  She has the stool sample cups and will plan to bring them in to the lab next Friday when she has another appointment.  Please extend the lab orders until next Friday.  If she does not present with samples, will cancel lab orders and communicate with Dr. Alfonse Spruce (PCP).   Gilles Chiquito, MD

## 2020-09-20 NOTE — Addendum Note (Signed)
Addended byGaylan Gerold on: 09/20/2020 02:48 PM   Modules accepted: Orders

## 2020-09-25 ENCOUNTER — Other Ambulatory Visit: Payer: Self-pay

## 2020-09-25 DIAGNOSIS — G6289 Other specified polyneuropathies: Secondary | ICD-10-CM

## 2020-09-25 NOTE — Telephone Encounter (Signed)
gabapentin (NEURONTIN) 300 MG capsule (Expired), refill request @ [place], [place] AT [facility].

## 2020-09-27 ENCOUNTER — Other Ambulatory Visit: Payer: Medicaid Other

## 2020-09-27 NOTE — Progress Notes (Signed)
Patient spoke with Triage RN, Lauren D.   Reviewed orders for stool specimen.  Future order.  Pending collection.   , PBT 

## 2020-09-27 NOTE — Telephone Encounter (Signed)
Patient came to clinic today thinking she had orders for blood work. Explained orders were for stool samples and they need to be formed stool. She states she has collection containers at home, but she is still having diarrhea. States she is following up with GI for this. Nothing further needed at this time.

## 2020-10-04 ENCOUNTER — Telehealth: Payer: Self-pay | Admitting: *Deleted

## 2020-10-04 MED ORDER — GABAPENTIN 300 MG PO CAPS: 300.0000 mg | ORAL_CAPSULE | Freq: Two times a day (BID) | ORAL | 2 refills | Status: DC

## 2020-10-04 NOTE — Telephone Encounter (Signed)
Patient states been calling for a week to get her Gabapentin refilled. Drugstore in chart is correct.

## 2020-10-04 NOTE — Telephone Encounter (Signed)
Refill request sent in separate encounter.

## 2020-10-10 NOTE — Addendum Note (Signed)
Addended byGaylan Gerold on: 10/10/2020 01:00 PM   Modules accepted: Orders

## 2020-11-28 ENCOUNTER — Encounter: Payer: Self-pay | Admitting: Internal Medicine

## 2020-11-28 ENCOUNTER — Inpatient Hospital Stay (HOSPITAL_COMMUNITY)
Admission: AD | Admit: 2020-11-28 | Discharge: 2020-12-01 | DRG: 392 | Disposition: A | Discharge status: Home / Self Care | Payer: Medicaid Other | Source: Ambulatory Visit | Attending: Student in an Organized Health Care Education/Training Program | Admitting: Student in an Organized Health Care Education/Training Program

## 2020-11-28 ENCOUNTER — Ambulatory Visit: Payer: Medicaid Other | Admitting: Internal Medicine

## 2020-11-28 ENCOUNTER — Inpatient Hospital Stay (HOSPITAL_COMMUNITY): Payer: Medicaid Other

## 2020-11-28 ENCOUNTER — Other Ambulatory Visit: Payer: Self-pay

## 2020-11-28 VITALS — BP 108/88 | HR 106 | Temp 98.2°F | Resp 28 | Ht 64.0 in | Wt 95.7 lb

## 2020-11-28 DIAGNOSIS — Z87891 Personal history of nicotine dependence: Secondary | ICD-10-CM | POA: Diagnosis not present

## 2020-11-28 DIAGNOSIS — Z20822 Contact with and (suspected) exposure to covid-19: Secondary | ICD-10-CM | POA: Diagnosis present

## 2020-11-28 DIAGNOSIS — F32A Depression, unspecified: Secondary | ICD-10-CM | POA: Diagnosis present

## 2020-11-28 DIAGNOSIS — R1314 Dysphagia, pharyngoesophageal phase: Secondary | ICD-10-CM | POA: Diagnosis present

## 2020-11-28 DIAGNOSIS — K219 Gastro-esophageal reflux disease without esophagitis: Secondary | ICD-10-CM | POA: Diagnosis present

## 2020-11-28 DIAGNOSIS — Z1152 Encounter for screening for COVID-19: Secondary | ICD-10-CM | POA: Diagnosis present

## 2020-11-28 DIAGNOSIS — Z79899 Other long term (current) drug therapy: Secondary | ICD-10-CM

## 2020-11-28 DIAGNOSIS — K222 Esophageal obstruction: Principal | ICD-10-CM | POA: Diagnosis present

## 2020-11-28 DIAGNOSIS — I959 Hypotension, unspecified: Secondary | ICD-10-CM | POA: Diagnosis present

## 2020-11-28 DIAGNOSIS — Z681 Body mass index (BMI) 19 or less, adult: Secondary | ICD-10-CM | POA: Diagnosis not present

## 2020-11-28 DIAGNOSIS — Z800 Family history of malignant neoplasm of digestive organs: Secondary | ICD-10-CM

## 2020-11-28 DIAGNOSIS — G8929 Other chronic pain: Secondary | ICD-10-CM | POA: Diagnosis present

## 2020-11-28 DIAGNOSIS — B370 Candidal stomatitis: Secondary | ICD-10-CM | POA: Diagnosis present

## 2020-11-28 DIAGNOSIS — K449 Diaphragmatic hernia without obstruction or gangrene: Secondary | ICD-10-CM | POA: Diagnosis present

## 2020-11-28 DIAGNOSIS — R053 Chronic cough: Secondary | ICD-10-CM | POA: Diagnosis present

## 2020-11-28 DIAGNOSIS — E876 Hypokalemia: Secondary | ICD-10-CM | POA: Diagnosis not present

## 2020-11-28 DIAGNOSIS — E559 Vitamin D deficiency, unspecified: Secondary | ICD-10-CM | POA: Diagnosis present

## 2020-11-28 DIAGNOSIS — R1319 Other dysphagia: Secondary | ICD-10-CM | POA: Diagnosis not present

## 2020-11-28 DIAGNOSIS — Z970 Presence of artificial eye: Secondary | ICD-10-CM

## 2020-11-28 DIAGNOSIS — E46 Unspecified protein-calorie malnutrition: Secondary | ICD-10-CM | POA: Diagnosis present

## 2020-11-28 DIAGNOSIS — R1312 Dysphagia, oropharyngeal phase: Secondary | ICD-10-CM | POA: Diagnosis not present

## 2020-11-28 DIAGNOSIS — R1310 Dysphagia, unspecified: Secondary | ICD-10-CM | POA: Diagnosis present

## 2020-11-28 DIAGNOSIS — Z803 Family history of malignant neoplasm of breast: Secondary | ICD-10-CM | POA: Diagnosis not present

## 2020-11-28 DIAGNOSIS — R0600 Dyspnea, unspecified: Secondary | ICD-10-CM

## 2020-11-28 DIAGNOSIS — M549 Dorsalgia, unspecified: Secondary | ICD-10-CM | POA: Diagnosis present

## 2020-11-28 LAB — CBC WITH DIFFERENTIAL/PLATELET
Abs Immature Granulocytes: 0.03 10*3/uL (ref 0.00–0.07)
Basophils Absolute: 0 10*3/uL (ref 0.0–0.1)
Basophils Relative: 0 %
Eosinophils Absolute: 0 10*3/uL (ref 0.0–0.5)
Eosinophils Relative: 0 %
HCT: 39 % (ref 36.0–46.0)
Hemoglobin: 13.7 g/dL (ref 12.0–15.0)
Immature Granulocytes: 1 %
Lymphocytes Relative: 38 %
Lymphs Abs: 2.1 10*3/uL (ref 0.7–4.0)
MCH: 35.6 pg — ABNORMAL HIGH (ref 26.0–34.0)
MCHC: 35.1 g/dL (ref 30.0–36.0)
MCV: 101.3 fL — ABNORMAL HIGH (ref 80.0–100.0)
Monocytes Absolute: 0.6 10*3/uL (ref 0.1–1.0)
Monocytes Relative: 11 %
Neutro Abs: 2.7 10*3/uL (ref 1.7–7.7)
Neutrophils Relative %: 50 %
Platelets: 235 10*3/uL (ref 150–400)
RBC: 3.85 MIL/uL — ABNORMAL LOW (ref 3.87–5.11)
RDW: 14.6 % (ref 11.5–15.5)
WBC: 5.4 10*3/uL (ref 4.0–10.5)
nRBC: 0 % (ref 0.0–0.2)

## 2020-11-28 LAB — COMPREHENSIVE METABOLIC PANEL
ALT: 13 U/L (ref 0–44)
AST: 27 U/L (ref 15–41)
Albumin: 3.5 g/dL (ref 3.5–5.0)
Alkaline Phosphatase: 70 U/L (ref 38–126)
Anion gap: 14 (ref 5–15)
BUN: <5 mg/dL — ABNORMAL LOW (ref 6–20)
CO2: 23 mmol/L (ref 22–32)
Calcium: 8.9 mg/dL (ref 8.9–10.3)
Chloride: 99 mmol/L (ref 98–111)
Creatinine, Ser: 0.49 mg/dL (ref 0.44–1.00)
GFR, Estimated: >60 mL/min (ref >60)
Glucose, Bld: 99 mg/dL (ref 70–99)
Potassium: 2.9 mmol/L — ABNORMAL LOW (ref 3.5–5.1)
Sodium: 136 mmol/L (ref 135–145)
Total Bilirubin: 0.5 mg/dL (ref 0.3–1.2)
Total Protein: 5.7 g/dL — ABNORMAL LOW (ref 6.5–8.1)

## 2020-11-28 LAB — GLUCOSE, CAPILLARY: Glucose-Capillary: 154 mg/dL — ABNORMAL HIGH (ref 70–99)

## 2020-11-28 LAB — VITAMIN D 25 HYDROXY (VIT D DEFICIENCY, FRACTURES): Vit D, 25-Hydroxy: 11.67 ng/mL — ABNORMAL LOW (ref 30–100)

## 2020-11-28 LAB — MONONUCLEOSIS SCREEN: Mono Screen: NEGATIVE

## 2020-11-28 LAB — MAGNESIUM: Magnesium: 1.6 mg/dL — ABNORMAL LOW (ref 1.7–2.4)

## 2020-11-28 LAB — PHOSPHORUS: Phosphorus: 3.9 mg/dL (ref 2.5–4.6)

## 2020-11-28 MED ORDER — ENSURE ENLIVE PO LIQD: 237.0000 mL | Freq: Two times a day (BID) | ORAL | Status: DC

## 2020-11-28 MED ORDER — ENOXAPARIN SODIUM 40 MG/0.4ML IJ SOSY
40.0000 mg | PREFILLED_SYRINGE | Freq: Every day | INTRAMUSCULAR | Status: DC
  Administered 2020-11-28: 40 mg via SUBCUTANEOUS
  Filled 2020-11-28: qty 0.4

## 2020-11-28 MED ORDER — LIDOCAINE VISCOUS HCL 2 % MT SOLN
15.0000 mL | OROMUCOSAL | Status: DC | PRN
  Filled 2020-11-28: qty 15

## 2020-11-28 MED ORDER — FLUOXETINE HCL 20 MG PO CAPS
40.0000 mg | ORAL_CAPSULE | Freq: Every day | ORAL | Status: DC
  Administered 2020-11-29 – 2020-12-01 (×3): 40 mg via ORAL
  Filled 2020-11-28 (×3): qty 2

## 2020-11-28 MED ORDER — PANTOPRAZOLE SODIUM 40 MG PO TBEC
40.0000 mg | DELAYED_RELEASE_TABLET | Freq: Every day | ORAL | Status: DC
  Administered 2020-11-28 – 2020-12-01 (×4): 40 mg via ORAL
  Filled 2020-11-28 (×4): qty 1

## 2020-11-28 MED ORDER — VITAMIN D (ERGOCALCIFEROL) 1.25 MG (50000 UNIT) PO CAPS
50000.0000 [IU] | ORAL_CAPSULE | ORAL | Status: DC
  Administered 2020-11-29: 50000 [IU] via ORAL
  Filled 2020-11-28: qty 1

## 2020-11-28 MED ORDER — OLANZAPINE 5 MG PO TABS
20.0000 mg | ORAL_TABLET | Freq: Every day | ORAL | Status: DC
  Administered 2020-11-28 – 2020-11-30 (×3): 20 mg via ORAL
  Filled 2020-11-28 (×3): qty 4

## 2020-11-28 MED ORDER — GABAPENTIN 300 MG PO CAPS
300.0000 mg | ORAL_CAPSULE | Freq: Two times a day (BID) | ORAL | Status: DC
  Administered 2020-11-28 – 2020-12-01 (×6): 300 mg via ORAL
  Filled 2020-11-28 (×6): qty 1

## 2020-11-28 MED ORDER — ENSURE ENLIVE PO LIQD
237.0000 mL | Freq: Two times a day (BID) | ORAL | Status: DC
  Administered 2020-11-28 – 2020-11-30 (×4): 237 mL via ORAL

## 2020-11-28 MED ORDER — POTASSIUM CHLORIDE 20 MEQ PO PACK
40.0000 meq | PACK | Freq: Two times a day (BID) | ORAL | Status: AC
  Administered 2020-11-28 – 2020-11-29 (×2): 40 meq via ORAL
  Filled 2020-11-28 (×2): qty 2

## 2020-11-28 MED ORDER — RIVAROXABAN 10 MG PO TABS: 10.0000 mg | ORAL_TABLET | Freq: Every day | ORAL | Status: DC

## 2020-11-28 MED ORDER — MAGNESIUM SULFATE 2 GM/50ML IV SOLN
2.0000 g | Freq: Once | INTRAVENOUS | Status: AC
  Administered 2020-11-28: 2 g via INTRAVENOUS
  Filled 2020-11-28: qty 50

## 2020-11-28 MED ORDER — LACTATED RINGERS IV BOLUS
1000.0000 mL | Freq: Once | INTRAVENOUS | Status: AC
Start: 2020-11-28 — End: 2020-11-28
  Administered 2020-11-28: 1000 mL via INTRAVENOUS

## 2020-11-28 MED ORDER — VENLAFAXINE HCL ER 37.5 MG PO CP24
37.5000 mg | ORAL_CAPSULE | Freq: Every day | ORAL | Status: DC
  Administered 2020-11-29 – 2020-12-01 (×3): 37.5 mg via ORAL
  Filled 2020-11-28 (×3): qty 1

## 2020-11-28 NOTE — Assessment & Plan Note (Signed)
[name] is a 55 y.o. female presenting today for sore throat, trouble swallowing and associated weight loss. She has a hx of dysphagia with significant weight loss this year (>40 lbs) with EGD 03/08/20 showing moderate stenosis of the esophagus that was dilated (with mild mucosal tear and moderate improvement), mild Schatski's ring which was dilated and fractured, a 2cm hiatal hernia, and peptic duodenitis. She did fairly well and tolerated PO intake since then, up until 2 weeks when she noticed a decreased appetite with poor PO intake once again. She has trouble swallowing with very poor PO intake, stating her last meal was 2 weeks ago. She has essentially been drinking water, sodas, and Kool-aid for 2 weeks. She complains that she is unable to swallow foods, because of an associated dull achy pain and feels that the "food gets stuck". She complains of sore throat, cough, and SHOB x 1 week. No associated fever, chills, N/V, diarhhea, constipation, malaise, headaches, or confusion. Given soft BP SBP 95-105 and tachycardia 110's in s/o poor PO intake x 2 weeks and requiring urgent GI evaluation, will proceed with admitting patient to hospital for IVF and GI evaluation, including likely barium swallow.  -Admit to hospital for IVF, nutrition, and GI evaluation

## 2020-11-28 NOTE — Plan of Care (Signed)
  Problem: Education: Goal: Knowledge of General Education information will improve Description: Including pain rating scale, medication(s)/side effects and non-pharmacologic comfort measures Outcome: Progressing   Problem: Clinical Measurements: Goal: Diagnostic test results will improve Outcome: Progressing   Problem: Nutrition: Goal: Adequate nutrition will be maintained Outcome: Progressing   Problem: Coping: Goal: Level of anxiety will decrease Outcome: Progressing   Problem: Elimination: Goal: Will not experience complications related to bowel motility Outcome: Progressing

## 2020-11-28 NOTE — Progress Notes (Signed)
CC: complaints of sore throat, trouble swallowing and weight loss.   HPI:  [name] is a 55 y.o. female with a PMHx stated below and presents today for stated above. She has a hx of dysphagia with significant weight loss this year (>40 lbs) with EGD 03/08/20 showing moderate stenosis of the esophagus that was dilated (with mild mucosal tear and moderate improvement), mild Schatski's ring which was dilated and fractured, a 2cm hiatal hernia, and peptic duodenitis. She did well and tolerated PO intake since then, up until 2 weeks. She has had trouble swallowing with very poor PO intake, stating her last meal was 2 weeks ago. She has essentially been drinking water, sodas, and Kool-aid for past 2 weeks. She complains that she is unable to swallow foods, because of an associated dull achy pain and the "food gets stuck". Given soft BP and tachycardia in s/o poor PO intake x 2 weeks and requiring urgent GI evaluation, will proceed with admitting patient to hospital for IVF and GI evaluation, including likely barium swallow.  Please see problem based assessment and plan for additional details.   Past Medical History:  Diagnosis Date   ALLERGIC RHINITIS 10/10/2009   Qualifier: Diagnosis of  By: Guy Sandifer DO, Shelly     CANNABIS ABUSE 04/17/2008   Qualifier: Diagnosis of  By: Redmond Pulling  MD, Valerie     Chronic abdominal pain    Chronic Abd distension / bloating. W/U includes CT ABD & pelvis 7/08 negative. EGD 5/09 Dr Penelope Coop :erythematous gastric mucosa and bx c/w chronic gastritis - was neg for H pylori. Duo polyp had path c/w peptic duodenitis   Depression    DEPRESSION 08/05/2006   Qualifier: Diagnosis of  By: Redmond Pulling  MD, Valerie     Depression    GERD 08/05/2006   Qualifier: Diagnosis of  By: Redmond Pulling  MD, Luanne Bras, unspecified 10/16/2009   Qualifier: Diagnosis of  By: Guy Sandifer DO, Shelly     Hemorrhoids 01/18/2012   Hernia of abdominal cavity    INSOMNIA 10/15/2008    Qualifier: Diagnosis of  By: Redmond Pulling  MD, Valerie     Irritable bowel syndrome 08/06/2009   Qualifier: Diagnosis of  By: Criss Alvine     NICOTINE ADDICTION 07/06/2008   Started on Chantix.     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 90 tablet 0   FLUoxetine (PROZAC) 20 MG capsule Take 2 capsules (40 mg total) by mouth daily. IM Program 30 capsule 0   gabapentin (NEURONTIN) 300 MG capsule Take 1 capsule (300 mg total) by mouth 2 (two) times daily. 60 capsule 2   Multiple Vitamin (MULTIVITAMIN WITH MINERALS) TABS tablet Take 1 tablet by mouth daily.     OLANZapine (ZYPREXA) 20 MG tablet Take 20 mg by mouth at bedtime.     omeprazole (PRILOSEC) 20 MG capsule Take 1 capsule (20 mg total) by mouth daily. 90 capsule 3   potassium chloride 20 MEQ TBCR Take 20 mEq by mouth daily. 30 tablet 0   venlafaxine XR (EFFEXOR-XR) 37.5 MG 24 hr capsule Take 37.5 mg by mouth daily with breakfast.     [DISCONTINUED] mirtazapine (REMERON) 15 MG tablet Take 1 tablet (15 mg  total) by mouth at bedtime. 30 tablet 2   No current facility-administered medications on file prior to visit.    Family History  Problem Relation Age of Onset   Cancer Mother 35       presumed breast ca   Cancer Paternal Grandmother        colon cancer   Cancer - Colon Other        Uncle    Colon cancer Maternal Grandmother    Esophageal cancer Neg Hx    Stomach cancer Neg Hx    Rectal cancer Neg Hx     Social History   Socioeconomic History   Marital status: Single    Spouse name: Not on file   Number of children: Not on file   Years of education: Not on file   Highest education level: Not on file  Occupational History   Not on file  Tobacco Use   Smoking status: Former     Packs/day: 0.25    Types: Cigarettes    Quit date: 10/28/2019    Years since quitting: 1.0   Smokeless tobacco: Never   Tobacco comments:    6 cigs/day  Vaping Use   Vaping Use: Never used  Substance and Sexual Activity   Alcohol use: No    Alcohol/week: 0.0 standard drinks   Drug use: Not Currently    Comment: Marijuana.; former   Sexual activity: Not Currently    Birth control/protection: None  Other Topics Concern   Not on file  Social History Narrative   Lives with son. 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 In relationship. Has medicaid. Got GED. Smoked cigs 10-15 yrs 1PPD> Restarted 3 months in 2011 but quit after 3 months.   Social Determinants of Health   Financial Resource Strain: Not on file  Food Insecurity: Not on file  Transportation Needs: Not on file  Physical Activity: Not on file  Stress: Not on file  Social Connections: Not on file  Intimate Partner Violence: Not on file    Review of Systems: ROS negative except for what is noted on the assessment and plan.  Vitals:   11/28/20 1449  BP: 108/88  Pulse: (!) 106  Resp: (!) 28  Temp: 98.2 F (36.8 C)  TempSrc: Oral  SpO2: 98%  Weight: 95 lb 11.2 oz (43.4 kg)  Height: 5\' 4"  (1.626 m)     Physical Exam: Constitutional: alert, well-appearing, in NAD HENT: normocephalic, atraumatic, mucous membranes moist, no palpable lymphadenopathy  Eyes: conjunctiva non-erythematous, EOMI Cardiovascular: regular rhythm, tachycardic, no m/r/g, non-edematous bilateral LE Pulmonary/Chest: normal work of breathing on room air, LCTAB Abdominal: soft, non-tender to palpation, non-distended MSK: normal bulk and tone Neurological: A&O x 3, 5/5 strength in bilateral upper and lower extremities  Skin: warm and dry   Assessment & Plan:   See Encounters Tab for problem based charting.  Patient seen with Dr. Marzetta Board, MD  Internal Medicine Resident, PGY-1 [facility]  3:10 PM,  11/28/2020

## 2020-11-28 NOTE — Progress Notes (Signed)
Report was called to [employer] on 28M.  Patient remains alert and oriented.  Transported via wheelchair to 5 M Bed 5.  Sander Nephew, RN 11/28/2020 4:41 PM.

## 2020-11-28 NOTE — H&P (Addendum)
Date: 11/28/2020               Patient Name:  Stephanie Padilla MRN: [id number]  DOB: Apr 17, 1965 Age / Sex: 55 y.o., female   PCP: [name], DO         Medical Service: Internal Medicine Teaching Service         Attending Physician: Dr. Velna Ochs, MD    First Contact: Dr. France Ravens Pager: [phone]  Second Contact: Dr. [name] Pager: [phone]       After Hours (After 5p/  First Contact Pager: [phone]  weekends / holidays): Second Contact Pager: [phone]   Chief Complaint: Dysphagia  History of Present Illness:   Stephanie Padilla is a 55 year old female with past medical history of depression, goiter, vitamin D deficiency who was admitted from the [facility] for worsening dysphagia and poor p.o. intake for the past 2 weeks.  She states that her appetite is down due to worsening dysphagia.  Said that she could not swallow and food stuck at her throat.  Report normal chewing action but food cannot get past the pharynx due to pain and obstruction.  States that she had globus sensation in the past but is getting worse in the last 2 weeks.  Also report pain with swallow, locates in the back of her throat.  She denies any dysphagia after food getting past the pharynx.  Patient endorses acid reflux symptoms.  Also endorses food regurgitation after eating.  States that she can have vomiting 5 minutes after eating.  Endorses early satiety.  Patient denies chest pain, fever, runny nose or upper respiratory symptoms.  She endorses chronic cough and shortness of breath.  She reports mainly eating liquid foods such as Ensure, Kool-Aid in the last 2 weeks.  She could not tolerate solid food.  Endorses weight drops because of poor p.o. intake.  She has had an EGD done in February which showed a benign esophageal stricture in the upper third and Schatzki's ring in the lower third that were both dilated.  Biopsies were taken from the esophagus and duodenum came back negative for plastic  change.  States that her dysphagia did not improve after the EGD.  Meds:  No outpatient medications have been marked as taking for the 11/28/20 encounter [facility] Encounter).  Vitamin D3 Fluoxetine Venlafaxine Gabapentin Olanzapine Omeprazole   Allergies: Allergies as of 11/28/2020   (No Known Allergies)   Past Medical History:  Diagnosis Date   ALLERGIC RHINITIS 10/10/2009   Qualifier: Diagnosis of  By: Guy Sandifer DO, Shelly     CANNABIS ABUSE 04/17/2008   Qualifier: Diagnosis of  By: Redmond Pulling  MD, Valerie     Chronic abdominal pain    Chronic Abd distension / bloating. W/U includes CT ABD & pelvis 7/08 negative. EGD 5/09 Dr Penelope Coop :erythematous gastric mucosa and bx c/w chronic gastritis - was neg for H pylori. Duo polyp had path c/w peptic duodenitis   Depression    DEPRESSION 08/05/2006   Qualifier: Diagnosis of  By: Redmond Pulling  MD, Valerie     Depression    GERD 08/05/2006   Qualifier: Diagnosis of  By: Redmond Pulling  MD, Luanne Bras, unspecified 10/16/2009   Qualifier: Diagnosis of  By: Guy Sandifer DO, Shelly     Hemorrhoids 01/18/2012   Hernia of abdominal cavity    INSOMNIA 10/15/2008   Qualifier: Diagnosis of  By: Redmond Pulling  MD, Valerie     Irritable bowel syndrome 08/06/2009  Qualifier: Diagnosis of  By: Criss Alvine     NICOTINE ADDICTION 07/06/2008   Started on Chantix.     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  Used to smoke half a pack a day -No drug use  Review of Systems: A complete ROS was negative except as per  HPI.   Physical Exam: Blood pressure 92/76, pulse 93, temperature 99.7 F (37.6 C), temperature source Oral, resp. rate 16, SpO2 100 %. Physical Exam Constitutional:      General: She is not in acute distress.    Appearance: She is not toxic-appearing.  HENT:     Head: Normocephalic.     Ears:     Comments: Left prosthetic eye    Mouth/Throat:     Mouth: Mucous membranes are moist.     Comments: Moist mucosal membrane.  Pharynx erythematous but no purulent discharge.  No obvious mass or obstruction visualized. Eyes:     General:        Right eye: No discharge.     Conjunctiva/sclera: Conjunctivae normal.  Neck:     Comments: No thyromegaly.  No obvious goiter palpated.  Bilateral cervical lymphadenopathy palpated, tender to palpation. Cardiovascular:     Rate and Rhythm: Normal rate and regular rhythm.     Heart sounds: Normal heart sounds. No murmur heard.    Comments: No LE edema Pulmonary:     Effort: Pulmonary effort is normal. No respiratory distress.     Breath sounds: Normal breath sounds. No wheezing.  Abdominal:     General: Bowel sounds are normal. There is no distension.     Palpations: Abdomen is soft.     Tenderness: There is no abdominal tenderness.  Musculoskeletal:        General: No deformity. Normal range of motion.     Cervical back: Normal range of motion.  Skin:    General: Skin is warm and dry.     Coloration: Skin is not jaundiced.  Neurological:     General: No focal deficit present.     Mental Status: She is alert and oriented to person, place, and time.  Psychiatric:        Mood and Affect: Mood normal.        Behavior: Behavior normal.     Assessment & Plan by Problem: Active Problems:   Dysphagia  Stephanie Padilla is a 55 year old female with past medical history of depression, goiter, vitamin D deficiency who was admitted from the [facility] for worsening dysphagia and poor p.o. intake, currently being worked up for possible acute  pharyngitis.  Dysphagia Subacute pharyngitis Her presentation of dysphagia and odynophagia at the pharyngeal level concerning for subacute pharyngitis.  Pharynx erythematous on exam but no purulent discharge.  Will obtain group A strep swab, mono, CMV, HSV and HIV test. She does have history of chronic  dysphagia and poor p.o. intake.  The EGD in February showed a esophageal stricture in the upper third and scattered scaring in the lower third which were both dilated.  It did not mention any obstruction in the pharyngeal level.  Biopsy taken from esophagus and duodenum both came back benign.  She reports that her dysphagia did not improve after the EGD.  Unclear etiology of her dysphagia at this time. Given her history of smoking, will obtain CT neck to rule out malignancy. -Pending CT neck -Pending CMV, HSV, group A strep and mono -Will obtain speech therapy tomorrow -She will need a barium swallow -Can consider a gastric emptying study given her early satiety and food regurgitation postprandial -If evidence of obstruction on barium swallow, will need GI evaluation -Will give 1 L of LR bolus for hypotension. -Attempt clear liquid diet -Pantoprazole for GERD  Depression Resume fluoxetine and venlafaxine  Vitamin D deficiency -Recheck level  Full code DVT: Lovenox Diet: Clear liquid Fluid: LR  Dispo: Admit patient to Inpatient with expected length of stay greater than 2 midnights.  SignedGaylan Gerold, DO 11/28/2020, 6:43 PM  Pager: [phone] After 5pm on weekdays and 1pm on weekends: On Call pager: [phone]

## 2020-11-29 ENCOUNTER — Inpatient Hospital Stay (HOSPITAL_COMMUNITY): Payer: Medicaid Other

## 2020-11-29 DIAGNOSIS — R1310 Dysphagia, unspecified: Secondary | ICD-10-CM

## 2020-11-29 DIAGNOSIS — R1319 Other dysphagia: Secondary | ICD-10-CM

## 2020-11-29 DIAGNOSIS — K222 Esophageal obstruction: Secondary | ICD-10-CM

## 2020-11-29 DIAGNOSIS — K449 Diaphragmatic hernia without obstruction or gangrene: Secondary | ICD-10-CM

## 2020-11-29 LAB — BASIC METABOLIC PANEL
Anion gap: 8 (ref 5–15)
BUN: <5 mg/dL — ABNORMAL LOW (ref 6–20)
CO2: 28 mmol/L (ref 22–32)
Calcium: 8.5 mg/dL — ABNORMAL LOW (ref 8.9–10.3)
Chloride: 103 mmol/L (ref 98–111)
Creatinine, Ser: 0.48 mg/dL (ref 0.44–1.00)
GFR, Estimated: >60 mL/min (ref >60)
Glucose, Bld: 87 mg/dL (ref 70–99)
Potassium: 3 mmol/L — ABNORMAL LOW (ref 3.5–5.1)
Sodium: 139 mmol/L (ref 135–145)

## 2020-11-29 LAB — TSH: TSH: 0.767 u[IU]/mL (ref 0.350–4.500)

## 2020-11-29 LAB — SARS CORONAVIRUS 2 (TAT 6-24 HRS): SARS Coronavirus 2: NEGATIVE

## 2020-11-29 LAB — GLUCOSE, CAPILLARY
Glucose-Capillary: 100 mg/dL — ABNORMAL HIGH (ref 70–99)
Glucose-Capillary: 110 mg/dL — ABNORMAL HIGH (ref 70–99)

## 2020-11-29 MED ORDER — LACTATED RINGERS IV SOLN: INTRAVENOUS | Status: AC

## 2020-11-29 MED ORDER — LACTATED RINGERS IV BOLUS
1000.0000 mL | Freq: Once | INTRAVENOUS | Status: AC
  Administered 2020-11-29: 1000 mL via INTRAVENOUS

## 2020-11-29 MED ORDER — POTASSIUM CHLORIDE 10 MEQ/100ML IV SOLN
10.0000 meq | INTRAVENOUS | Status: AC
  Administered 2020-11-29 (×4): 10 meq via INTRAVENOUS
  Filled 2020-11-29 (×4): qty 100
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
## 2020-11-29 NOTE — Progress Notes (Incomplete)
Patient's BP 80/62 MAP=69) HR 66 after 1L LR bolus. Patient is asymptomatic. Zinoviev,MD made aware via text page.

## 2020-11-29 NOTE — Progress Notes (Addendum)
HD#1 SUBJECTIVE:  Patient Summary: Stephanie Padilla is a 55 y.o. with a pertinent PMH of depression, goiter, and vitamin D deficiency who presented with worsening dysphagia and admitted for dysphagia and possible subacute pharyngitis.   Overnight Events: No acute events overnight  Interim History: This is hospital day 1 for Stephanie Padilla who was seen and evaluated at the bedside. She notes that her dysphagia is chronic, but seems to have gotten worse over the last 2 weeks. She states that today, she has been able to tolerate eating jello without any feeling that the food is getting stuck. She does endorse early satiety. Denies any abd pain or n/v/d.   OBJECTIVE:  Vital Signs: Vitals:   11/28/20 1729 11/28/20 2111 11/29/20 0147  BP: 92/76 93/65 94/72   Pulse: 93 82 63  Resp: 16 20 16   Temp: 99.7 F (37.6 C) 99.1 F (37.3 C) 98.1 F (36.7 C)  TempSrc: Oral Oral Oral  SpO2: 100% 100% 100%   Supplemental O2: Room Air SpO2: 100 %  There were no vitals filed for this visit.   Intake/Output Summary (Last 24 hours) at 11/29/2020 0523 Last data filed at 11/29/2020 0200 Gross per 24 hour  Intake 1516.99 ml  Output 0 ml  Net 1516.99 ml   Net IO Since Admission: 1,516.99 mL [11/29/20 0523]  Physical Exam:  General: No acute distress. HENT: Mucous membranes moist, no palpable LAD. Some white patches in back of throat/on tongue, possibly consistent with thrush CV: RRR. No murmurs, rubs, or gallops. No LE edema Pulmonary: Lungs CTAB. Normal effort. No wheezing or rales. Abdominal: Soft, nontender, nondistended. Normal bowel sounds. Extremities: Palpable radial and DP pulses. Normal ROM. Skin: Warm and dry. No obvious rash or lesions. Neuro: A&Ox3. No focal deficit. Psych: Normal mood and affect    ASSESSMENT/PLAN:  Assessment: Active Problems:   Dysphagia   Plan:  #Acute on Chronic Esophageal Dysphagia #Unintentional Weight loss, malnutrition  #Oral Thrush Her  presentation of dysphagia and odynophagia concerning for worsening esophgeal stricture vs. Esophageal candidiasis with possible thrush on exam. She does have history of chronic dysphagia and poor p.o. intake, also has known Schatzki's rings. The EGD in February showed a esophageal stricture in the upper third and scattered scaring in the lower third which were both dilated. She reports that her dysphagia did not improve after the EGD.  Unclear etiology of her dysphagia at this time. Did have SLP eval and recommended to keep patient on thin liquids, as she is a mild aspiration risk.  - GI consulted, appreciate recommendations - Will consider fluconazole, pending GI recs for endoscopy  - Consider gastric emptying study to evaluate early satiety?  Postural Dizziness With soft blood pressures this admission. - Checking orthostatics   #Vitamin D deficiency Vitamin D level 11 this admission. Patient was given Vit D 50,000 IU.   #Hypokalemia Potassium level low this morning at 3. Repleted with 40 mEq Kclor packet and 10 mEq IVPB x4. - Continue to monitor BMP - Replete electrolytes as needed  Best Practice: Diet: Clear liquid diet IVF: Fluids: LR, Rate:  75 cc/hr x 8 hrs VTE: enoxaparin (LOVENOX) injection 40 mg Start: 11/28/20 2200 Code: Full AB: None Therapy Recs: Pending DISPO: Anticipated discharge to Home pending  clinical improvement .  Signature: Buddy Duty, D.O.  Internal Medicine Resident, PGY-1 [facility]  Pager: [phone] 5:23 AM, 11/29/2020   Please contact the on call pager after 5 pm and on weekends at [phone].

## 2020-11-29 NOTE — TOC Initial Note (Signed)
Transition of Care [facility]) - Initial/Assessment Note    Patient Details  Name: Stephanie Padilla MRN: [id number] Date of Birth: 1965-09-11  Transition of Care [facility]) CM/SW Contact:    Tom-Johnson, Renea Ee, RN Phone Number: 11/29/2020, 10:11 AM  Clinical Narrative:                 CM spoke with patient about needs for post hospital transition. Lives alone at home. Has three children and three grand children. Not employed, on disability. 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 Does not have and does not use DME's. PCP is [name], DO and uses [facility] on [address]. Has Medicaid. Presented with sore throat and difficulty swallowing. Has EGD done in Feb and found to have benign-appearing esophageal stenosis. Has recent weight loss. GI and Speech consulted. Medical workup continues. CM will follow with needs.     Barriers to Discharge: Continued Medical Work up   Patient Goals and CMS Choice Patient states their goals for this hospitalization and ongoing recovery are:: To go home CMS Medicare.gov Compare Post Acute Care list provided to:: Patient    Expected Discharge Plan and Services     Discharge Planning Services: CM Consult   Living arrangements for the past 2 months: Single Family Home                                      Prior Living Arrangements/Services Living arrangements for the past 2 months: Single Family Home Lives with:: Self Patient language and need for interpreter reviewed:: Yes Do you feel safe going back to the place where you live?: Yes      Need for Family Participation in Patient Care: Yes (Comment) Care giver support system in place?: Yes (comment)   Criminal Activity/Legal Involvement Pertinent to Current Situation/Hospitalization: No - Comment as needed  Activities of Daily Living      Permission Sought/Granted Permission sought to share information with : Case  Manager, Family Supports Permission granted to share information with : Yes, Verbal Permission Granted              Emotional Assessment Appearance:: Appears stated age Attitude/Demeanor/Rapport: Engaged Affect (typically observed): Accepting, Appropriate, Calm, Hopeful Orientation: : Oriented to Self, Oriented to Place, Oriented to  Time, Oriented to Situation Alcohol / Substance Use: Not Applicable Psych Involvement: No (comment)  Admission diagnosis:  Dysphagia [R13.10] Patient Active Problem List   Diagnosis Date Noted   Macrocytosis without anemia 06/03/2020   Aortic atherosclerosis ([place]) 05/17/2020   High blood pressure 01/31/2020   Dysphagia 01/31/2020   Vitamin D Insufficiency 01/31/2020   Memory loss 01/31/2020   Tachycardia 01/31/2020   Hypokalemia 01/08/2020   Onychomycosis 07/18/2018   Uterine leiomyoma 10/13/2016   Pelvic pain 08/25/2016   Poor appetite 06/21/2013   Preventative health care 04/21/2012   Hemorrhoids 01/18/2012   

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
## 2020-11-29 NOTE — Plan of Care (Signed)
  Problem: Education: Goal: Knowledge of General Education information will improve Description: Including pain rating scale, medication(s)/side effects and non-pharmacologic comfort measures Outcome: Completed/Met

## 2020-11-29 NOTE — Progress Notes (Addendum)
Modified Barium Swallow Progress Note  Patient Details  Name: Tearah Saulsbury MRN: [id number] Date of Birth: 22-Sep-1965  Today's Date: 11/29/2020  Modified Barium Swallow completed.  Full report located under Chart Review in the Imaging Section.  Brief recommendations include the following:  Clinical Impression  Mandibular hardware was noted and pt reported that she had orthognatic sugery. She demonstrated prolonged bolus manipulation accross trials which she has attributed to being fearful of swallowing due to frequent "sticking" when she swallows. The pharyngeal phase of her swallow was WNL with normal pharyngeal clearance and no instances of penetration/aspiration. Pharyngoeosphageal segment (PES)/UES distention was reduced. Pt attempted regurgitation of the barium tablet when it began to become lodged at this level. She was able to propel the tablet to the hypopharynx, but the tablet became lodged at the PES once she swallowed again. Pt's study appears similar to that on 02/15/20; however, reflux was noted during the January study and the barium tablet was propelled with boluses of puree at that time. Today, transport of the barium tablet could not be faciliated with effortful swallows, additional boluses of thin liquids, puree, dysphagia 3 solids, or with regular texture solids. The tablet began to dissolve after provision of multiple additional boluses, and pt was ulitimately transported back to her room in an upright position and encouraged to continue to drink liquids to facilitate further dissolving of the tablet. GI has been consulted and an EGD is planned for 11/12 Amy, NP with with GI and Dr. Bryan Lemma were advised of results of the study. Pt's current diet of clear liquids will be continued with advancement per GI's recommendation. Further acute skilled SLP services are not clinically indicated at this time.   Swallow Evaluation Recommendations   Recommended Consults: Consider GI  evaluation;Consider esophageal assessment (GI already consulted and plan for EGD on 11/12)   SLP Diet Recommendations: Thin liquid (Continue clear liquids with advancement per GI's recommendations)   Liquid Administration via: Cup;Straw   Medication Administration: Crushed with puree   Supervision: Patient able to self feed   Compensations: Slow rate       Oral Care Recommendations: Oral care BID      Ahlia Lemanski I. Hardin Negus, [place], [place] Office number [phone] Pager [place] 11/29/2020,3:01 PM

## 2020-11-29 NOTE — Plan of Care (Signed)
  Problem: Health Behavior/Discharge Planning: Goal: Ability to manage health-related needs will improve Outcome: Progressing   Problem: Clinical Measurements: Goal: Diagnostic test results will improve Outcome: Progressing   Problem: Nutrition: Goal: Adequate nutrition will be maintained Outcome: Progressing   Problem: Coping: Goal: Level of anxiety will decrease Outcome: Progressing   
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
## 2020-11-30 ENCOUNTER — Encounter (HOSPITAL_COMMUNITY)
Admission: AD | Disposition: A | Discharge status: Home / Self Care | Payer: Self-pay | Source: Ambulatory Visit | Attending: Internal Medicine

## 2020-11-30 ENCOUNTER — Inpatient Hospital Stay (HOSPITAL_COMMUNITY): Payer: Medicaid Other | Admitting: Anesthesiology

## 2020-11-30 ENCOUNTER — Encounter (HOSPITAL_COMMUNITY): Payer: Self-pay | Admitting: Internal Medicine

## 2020-11-30 DIAGNOSIS — R1312 Dysphagia, oropharyngeal phase: Secondary | ICD-10-CM | POA: Diagnosis not present

## 2020-11-30 HISTORY — PX: BIOPSY: SHX5522

## 2020-11-30 HISTORY — PX: ESOPHAGOGASTRODUODENOSCOPY (EGD) WITH PROPOFOL: SHX5813

## 2020-11-30 HISTORY — PX: BALLOON DILATION: SHX5330

## 2020-11-30 LAB — BASIC METABOLIC PANEL
Anion gap: 8 (ref 5–15)
BUN: <5 mg/dL — ABNORMAL LOW (ref 6–20)
CO2: 28 mmol/L (ref 22–32)
Calcium: 8.7 mg/dL — ABNORMAL LOW (ref 8.9–10.3)
Chloride: 104 mmol/L (ref 98–111)
Creatinine, Ser: 0.36 mg/dL — ABNORMAL LOW (ref 0.44–1.00)
GFR, Estimated: >60 mL/min (ref >60)
Glucose, Bld: 96 mg/dL (ref 70–99)
Potassium: 3.9 mmol/L (ref 3.5–5.1)
Sodium: 140 mmol/L (ref 135–145)

## 2020-11-30 LAB — CBC
HCT: 35.7 % — ABNORMAL LOW (ref 36.0–46.0)
Hemoglobin: 12.3 g/dL (ref 12.0–15.0)
MCH: 35.9 pg — ABNORMAL HIGH (ref 26.0–34.0)
MCHC: 34.5 g/dL (ref 30.0–36.0)
MCV: 104.1 fL — ABNORMAL HIGH (ref 80.0–100.0)
Platelets: 226 10*3/uL (ref 150–400)
RBC: 3.43 MIL/uL — ABNORMAL LOW (ref 3.87–5.11)
RDW: 15 % (ref 11.5–15.5)
WBC: 4.2 10*3/uL (ref 4.0–10.5)
nRBC: 0 % (ref 0.0–0.2)

## 2020-11-30 LAB — GLUCOSE, CAPILLARY
Glucose-Capillary: 99 mg/dL (ref 70–99)
Glucose-Capillary: 85 mg/dL (ref 70–99)

## 2020-11-30 LAB — MAGNESIUM: Magnesium: 1.7 mg/dL (ref 1.7–2.4)

## 2020-11-30 LAB — CMV ANTIBODY, IGG (EIA): CMV Ab - IgG: 3.6 U/mL — ABNORMAL HIGH (ref 0.00–0.59)

## 2020-11-30 SURGERY — ESOPHAGOGASTRODUODENOSCOPY (EGD) WITH PROPOFOL
Anesthesia: Monitor Anesthesia Care

## 2020-11-30 MED ORDER — PROPOFOL 500 MG/50ML IV EMUL
INTRAVENOUS | Status: DC | PRN
  Administered 2020-11-30: 150 ug/kg/min via INTRAVENOUS

## 2020-11-30 MED ORDER — LACTATED RINGERS IV SOLN
INTRAVENOUS | Status: AC | PRN
  Administered 2020-11-30: 1000 mL via INTRAVENOUS

## 2020-11-30 SURGICAL SUPPLY — 15 items

## 2020-11-30 NOTE — Anesthesia Procedure Notes (Signed)
Procedure Name: MAC Date/Time: 11/30/2020 1:00 PM Performed by: Kyung Rudd, CRNA Pre-anesthesia Checklist: Patient identified, Emergency Drugs available, Suction available and Patient being monitored Patient Re-evaluated:Patient Re-evaluated prior to induction Oxygen Delivery Method: Circle system utilized Induction Type: IV induction Placement Confirmation: positive ETCO2 Dental Injury: Teeth and Oropharynx as per pre-operative assessment

## 2020-11-30 NOTE — Op Note (Signed)
[facility] Patient Name: Stephanie Padilla Procedure Date : 11/30/2020 MRN: [id number] Attending MD: Gerrit Heck , MD Date of Birth: [birth year] CSN: [id number] Age: 55 Admit Type: Inpatient Procedure:                Upper GI endoscopy (inpatient) Indications:              Dysphagia, History of esophageal stricture Providers:                Gerrit Heck, MD, Carlyn Reichert, RN, William Dalton, Technician Referring MD:              Medicines:                Monitored Anesthesia Care Complications:            No immediate complications. Estimated Blood Loss:     Estimated blood loss was minimal. Procedure:                Pre-Anesthesia Assessment:                           - Prior to the procedure, a History and Physical                            was performed, and patient medications and                            allergies were reviewed. The patient's tolerance of                            previous anesthesia was also reviewed. The risks                            and benefits of the procedure and the sedation                            options and risks were discussed with the patient.                            All questions were answered, and informed consent                            was obtained. Prior Anticoagulants: The patient has                            taken no previous anticoagulant or antiplatelet                            agents. ASA Grade Assessment: II - A patient with                            mild systemic disease. After reviewing the risks  and benefits, the patient was deemed in                            satisfactory condition to undergo the procedure.                           After obtaining informed consent, the endoscope was                            passed under direct vision. Throughout the                            procedure, the patient's blood pressure, pulse, and                             oxygen saturations were monitored continuously. The                            [id number]-[id number] ([id number]) Olympus endoscope was introduced                            through the mouth, and advanced to the second part                            of duodenum. The upper GI endoscopy was                            accomplished without difficulty. The patient                            tolerated the procedure well. Findings:      One benign-appearing, intrinsic moderate stenosis was found 16 cm from       the incisors. This stenosis measured 1 cm (in length). The stenosis was       traversed. This was dilated with passage of the endoscope alone with       appropriate mucosal rent. Therefore, additional balloon dilation could       not be performed at this site. Estimated blood loss was minimal.      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      A non-obstructing Schatzki ring was found in the lower third of the       esophagus. A TTS dilator was passed through the scope. Dilation with a       15-16.5-18 mm balloon dilator was performed to 18 mm. The dilation site       was examined and showed no bleeding, mucosal tear or perforation. This       was then biopsied with a cold forceps for fracturing of the ring (no       path submitted). Estimated blood loss was minimal.      The gastroesophageal flap valve was visualized endoscopically and       classified as Hill Grade III (minimal fold, loose to endoscope, hiatal       hernia likely).      The entire examined stomach was normal.  The examined duodenum was normal. Impression:               - Benign-appearing esophageal stenosis.                           - Ectopic gastric mucosa in the upper third of the                            esophagus.                           - Non-obstructing Schatzki ring. Dilated. Biopsied.                           - Gastroesophageal flap valve  classified as Hill                            Grade III (minimal fold, loose to endoscope, hiatal                            hernia likely).                           - Normal stomach.                           - Normal examined duodenum. Recommendation:           - Return patient to hospital ward for possible                            discharge same day.                           - Full liquid diet today then advance to soft foods                            tomorrow and continue slowly advancing as tolerated.                           - Continue present medications.                           - Will plan for repeat upper endoscopy in 4 weeks                            for retreatment as outpatient.                           - Will arrange for following in the GI office at                            appointment to be scheduled.                           - Please do not hesitate to contact the on-call GI  with additional questions or concerns. Procedure Code(s):        --- Professional ---                           [phone], Esophagogastroduodenoscopy, flexible,                            transoral; with transendoscopic balloon dilation of                            esophagus (less than 30 mm diameter)                           [id number], 59, Esophagogastroduodenoscopy, flexible,                            transoral; with biopsy, single or multiple Diagnosis Code(s):        --- Professional ---                           K22.2, Esophageal obstruction                           Q40.2, Other specified congenital malformations of                            stomach                           R13.10, Dysphagia, unspecified CPT copyright 2019 [employer]. All rights reserved. The codes documented in this report are preliminary and upon coder review may  be revised to meet current compliance requirements. Gerrit Heck, MD 11/30/2020 1:25:54 PM Number of  Addenda: 0

## 2020-11-30 NOTE — Interval H&P Note (Signed)
History and Physical Interval Note:  11/30/2020 12:48 PM  Stephanie Padilla  has presented today for surgery, with the diagnosis of dysphagia.  The various methods of treatment have been discussed with the patient and family. After consideration of risks, benefits and other options for treatment, the patient has consented to  Procedure(s): ESOPHAGOGASTRODUODENOSCOPY (EGD) WITH PROPOFOL (N/A) as a surgical intervention.  The patient's history has been reviewed, patient examined, no change in status, stable for surgery.  I have reviewed the patient's chart and labs.  Questions were answered to the patient's satisfaction.     Dominic Pea Vittorio Mohs
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
## 2020-11-30 NOTE — Anesthesia Postprocedure Evaluation (Signed)
Anesthesia Post Note  Patient: Stephanie Padilla  Procedure(s) Performed: ESOPHAGOGASTRODUODENOSCOPY (EGD) WITH PROPOFOL BALLOON DILATION     Patient location during evaluation: Endoscopy Anesthesia Type: MAC Level of consciousness: patient cooperative and awake Pain management: pain level controlled Vital Signs Assessment: post-procedure vital signs reviewed and stable Respiratory status: spontaneous breathing, nonlabored ventilation, respiratory function stable and patient connected to nasal cannula oxygen Cardiovascular status: stable and blood pressure returned to baseline Postop Assessment: no apparent nausea or vomiting Anesthetic complications: no   No notable events documented.  Last Vitals:  Vitals:   11/30/20 1336 11/30/20 1420  BP: 129/83 (!) 119/96  Pulse:  64  Resp: 19 16  Temp:  36.7 C  SpO2: 99% 100%    Last Pain:  Vitals:   11/30/20 1420  TempSrc: Oral  PainSc:                  Stephanie Padilla
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
## 2020-11-30 NOTE — Transfer of Care (Signed)
Immediate Anesthesia Transfer of Care Note  Patient: Stephanie Padilla  Procedure(s) Performed: ESOPHAGOGASTRODUODENOSCOPY (EGD) WITH PROPOFOL BALLOON DILATION  Patient Location: Endoscopy Unit  Anesthesia Type:MAC  Level of Consciousness: drowsy  Airway & Oxygen Therapy: Patient Spontanous Breathing  Post-op Assessment: Report given to RN and Post -op Vital signs reviewed and stable  Post vital signs: Reviewed and stable  Last Vitals:  Vitals Value Taken Time  BP    Temp    Pulse    Resp    SpO2      Last Pain:  Vitals:   11/30/20 1233  TempSrc: Temporal  PainSc: 0-No pain      Patients Stated Pain Goal: 0 (61/16/43 [id number])  Complications: No notable events documented.

## 2020-12-01 ENCOUNTER — Other Ambulatory Visit: Payer: Self-pay

## 2020-12-01 DIAGNOSIS — K222 Esophageal obstruction: Secondary | ICD-10-CM | POA: Diagnosis not present

## 2020-12-01 DIAGNOSIS — R1312 Dysphagia, oropharyngeal phase: Secondary | ICD-10-CM | POA: Diagnosis not present

## 2020-12-01 LAB — BASIC METABOLIC PANEL
Anion gap: 7 (ref 5–15)
BUN: 5 mg/dL — ABNORMAL LOW (ref 6–20)
CO2: 29 mmol/L (ref 22–32)
Calcium: 8.4 mg/dL — ABNORMAL LOW (ref 8.9–10.3)
Chloride: 101 mmol/L (ref 98–111)
Creatinine, Ser: 0.47 mg/dL (ref 0.44–1.00)
GFR, Estimated: >60 mL/min (ref >60)
Glucose, Bld: 89 mg/dL (ref 70–99)
Potassium: 3.9 mmol/L (ref 3.5–5.1)
Sodium: 137 mmol/L (ref 135–145)

## 2020-12-01 LAB — GLUCOSE, CAPILLARY: Glucose-Capillary: 77 mg/dL (ref 70–99)

## 2020-12-01 LAB — CBC
HCT: 36.8 % (ref 36.0–46.0)
Hemoglobin: 12.1 g/dL (ref 12.0–15.0)
MCH: 35.2 pg — ABNORMAL HIGH (ref 26.0–34.0)
MCHC: 32.9 g/dL (ref 30.0–36.0)
MCV: 107 fL — ABNORMAL HIGH (ref 80.0–100.0)
Platelets: 251 10*3/uL (ref 150–400)
RBC: 3.44 MIL/uL — ABNORMAL LOW (ref 3.87–5.11)
RDW: 14.8 % (ref 11.5–15.5)
WBC: 5.2 10*3/uL (ref 4.0–10.5)
nRBC: 0 % (ref 0.0–0.2)

## 2020-12-01 MED ORDER — ACETAMINOPHEN 325 MG PO TABS
650.0000 mg | ORAL_TABLET | Freq: Four times a day (QID) | ORAL | Status: DC | PRN
  Administered 2020-12-01: 650 mg via ORAL
  Filled 2020-12-01: qty 2

## 2020-12-01 MED ORDER — VITAMIN D (ERGOCALCIFEROL) 1.25 MG (50000 UNIT) PO CAPS: 50000.0000 [IU] | ORAL_CAPSULE | ORAL | 0 refills | Status: DC
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
## 2020-12-01 NOTE — Progress Notes (Signed)
Discharge:  Pt d/c from room via wheelchair, Family member with the pt.  Discharge instructions given to the patient and family members.  No questions from pt,  dressed in street clothes and left with discharge papers and prescriptions  in hand.  IV d/ced, tele removed and no complaints of pain or discomfort.

## 2020-12-01 NOTE — Progress Notes (Signed)
PT REFUSES flu and pne vaccination

## 2020-12-01 NOTE — Hospital Course (Addendum)
Patient Summary  Stephanie Padilla is a 54 y.o. female with a history of chronic dysphagia, depression, who presented with worsening dysphagia. Hospital course is outlined below.   #Dysphagia Patient was directly admitted following a visit with [facility] on 11/10 due to progressive worsening esophageal dysphagia, odynophagia, unintentional weight loss, and symptoms of postural dizziness. CXR negative. There was some concern for subacute pharyngitis. Infectious workup with positive IgG antibody for CMV, CMV IgM pending, negative mono. Speech therapy evaluated patient and conducted MBS with barium tablet lodged at [facility]. GI was consulted and agreed with repeat EGD with esophageal dilation. 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 Stomach and examined duodenum normal. Patient tolerated liquid diet well on 11/12. At time of discharge, patient reported improvement in her throat pain and was able to tolerate both solids and liquids without any bolus sensation or vomiting.   #Vitamin D deficiency  Patient has a history of Vit D deficiency, level was 11 during this admission. She was given one dose of weekly Vit D 50,000 IU on 11/11.   #Hypokalemia  K+ noted to be low at 3 and was repleted with 40 mEq Kclor packet and 10 mEq IVPB x4.  Follow-Up  Patient was instructed to follow-up with [facility] in the next 1-2 weeks and follow-up with GI outpatient with repeat upper endoscopy planned in 4 weeks.

## 2020-12-02 ENCOUNTER — Encounter (HOSPITAL_COMMUNITY): Payer: Self-pay | Admitting: Gastroenterology

## 2020-12-02 ENCOUNTER — Telehealth: Payer: Self-pay | Admitting: General Surgery

## 2020-12-02 DIAGNOSIS — K222 Esophageal obstruction: Secondary | ICD-10-CM

## 2020-12-02 DIAGNOSIS — R1310 Dysphagia, unspecified: Secondary | ICD-10-CM

## 2020-12-02 DIAGNOSIS — K219 Gastro-esophageal reflux disease without esophagitis: Secondary | ICD-10-CM

## 2020-12-02 LAB — CMV IGM: CMV IgM: <30 AU/mL (ref 0.0–29.9)

## 2020-12-02 NOTE — Telephone Encounter (Signed)
Contacted the patient and scheduled her EGD with dil. Patient was informed that she will receive all instructions via mychart. Patient verbalized understanding. Also sheduled 8 week follow up for the patient

## 2020-12-06 NOTE — Progress Notes (Signed)
Internal Medicine Clinic Attending  I saw and evaluated the patient.  I personally confirmed the key portions of the history and exam documented by Dr. Patel and I reviewed pertinent patient test results.  The assessment, diagnosis, and plan were formulated together and I agree with the documentation in the resident's note.  

## 2020-12-06 NOTE — Addendum Note (Signed)
Addended by: Aldine Contes on: 12/06/2020 02:09 PM   Modules accepted: Level of Service

## 2020-12-09 ENCOUNTER — Encounter: Payer: Medicaid Other | Admitting: Student

## 2020-12-17 ENCOUNTER — Ambulatory Visit: Payer: Medicaid Other | Admitting: Student

## 2020-12-17 VITALS — BP 122/91 | HR 100 | Temp 97.9°F | Wt 96.6 lb

## 2020-12-17 DIAGNOSIS — Z87891 Personal history of nicotine dependence: Secondary | ICD-10-CM | POA: Diagnosis not present

## 2020-12-17 DIAGNOSIS — R634 Abnormal weight loss: Secondary | ICD-10-CM | POA: Diagnosis not present

## 2020-12-17 DIAGNOSIS — E876 Hypokalemia: Secondary | ICD-10-CM

## 2020-12-17 DIAGNOSIS — G6289 Other specified polyneuropathies: Secondary | ICD-10-CM | POA: Diagnosis not present

## 2020-12-17 DIAGNOSIS — Z0000 Encounter for general adult medical examination without abnormal findings: Secondary | ICD-10-CM

## 2020-12-17 DIAGNOSIS — R768 Other specified abnormal immunological findings in serum: Secondary | ICD-10-CM | POA: Insufficient documentation

## 2020-12-17 DIAGNOSIS — K222 Esophageal obstruction: Secondary | ICD-10-CM

## 2020-12-17 DIAGNOSIS — N393 Stress incontinence (female) (male): Secondary | ICD-10-CM | POA: Insufficient documentation

## 2020-12-17 DIAGNOSIS — F323 Major depressive disorder, single episode, severe with psychotic features: Secondary | ICD-10-CM

## 2020-12-17 MED ORDER — NICOTINE POLACRILEX 2 MG MT GUM: 2.0000 mg | CHEWING_GUM | OROMUCOSAL | 0 refills | Status: DC | PRN

## 2020-12-17 MED ORDER — GABAPENTIN 300 MG PO CAPS: 300.0000 mg | ORAL_CAPSULE | Freq: Three times a day (TID) | ORAL | 2 refills | Status: DC

## 2020-12-17 MED ORDER — NICOTINE 7 MG/24HR TD PT24: 7.0000 mg | MEDICATED_PATCH | TRANSDERMAL | 0 refills | Status: AC

## 2020-12-17 NOTE — Assessment & Plan Note (Addendum)
Assessment: Hypokalemia thought to be secondary to her nutritional deficiencies.  We will recheck today to see if it has improvement since she has had increased p.o. intake.  In the past adrenal insufficiency has been ruled out.  Plan: -Recheck potassium today -Supplement as needed -Continue to work on poor p.o. intake  Addendum:  Potassium levels continue to be low. Will continue on supplement and have her follow up with her PCP this month. In the past she had a urine potassium performed, but it appears as though at this time she was on supplemental potassium which can alter the results.

## 2020-12-17 NOTE — Assessment & Plan Note (Addendum)
PHQ-9 of 19.  Patient follows with [facility].  She meets with counselor every 2 weeks.  She states she has fluctuating episodes of depression.  Recommended patient that she continue to work with [facility] for further medication changes and counseling services.

## 2020-12-17 NOTE — Patient Instructions (Signed)
Thank you, [name] for allowing [place] to provide your care today. Today we discussed .  Esophageal stricture About that your swelling has improved.  Please follow-up with your GI doctors next month as well as in January.  If you notice that you are having worsening appetite and decreased pain.  Please consider starting to drink protein supplements daily, such as boost or ensure  Low Potassium We will be rechecking her potassium levels today.  I think that this is due to decreased appetite secondary to your narrowed esophagus.  This should improve as you continue to improve your diet.  Smoking I am glad to hear that you are ready to stop smoking.  I prescribed you a nicotine patch as well as the gum.  Please apply 1 patch daily and use the gum intermittently as needed if you are having increased cravings.  Instructions on how to use the gum chew it for a bit and then put it in the back of your mouth and let it sit there.  If you start having cravings again chew the gum and then place it back in the corner of your mouth.  Please do not chew it as he would regular gum.  Lower extremity numbness and tingling I have refilled your gabapentin and increase the dose per Dr. Lamont Padilla request.     I have ordered the following labs for you:  Lab Orders         BMP8+Anion Gap       Referrals ordered today:   Referral Orders  No referral(s) requested today     I have ordered the following medication/changed the following medications:   Stop the following medications: Medications Discontinued During This Encounter  Medication Reason   gabapentin (NEURONTIN) 300 MG capsule Reorder     Start the following medications: Meds ordered this encounter  Medications   gabapentin (NEURONTIN) 300 MG capsule    Sig: Take 1 capsule (300 mg total) by mouth 3 (three) times daily.    Dispense:  90 capsule    Refill:  2   nicotine (NICODERM CQ - DOSED IN MG/24 HR) 7 mg/24hr patch    Sig:  Place 1 patch (7 mg total) onto the skin daily.    Dispense:  70 patch    Refill:  0   nicotine polacrilex (NICORETTE) 2 MG gum    Sig: Take 1 each (2 mg total) by mouth as needed for smoking cessation.    Dispense:  100 tablet    Refill:  0     Follow up:  1 month follow-up with PCP to discuss smoking and need for parking sticker     Should you have any questions or concerns please call the internal medicine clinic at [phone].    Stephanie Padilla, D.O. [place]

## 2020-12-17 NOTE — Assessment & Plan Note (Signed)
Assessment: Patient notes urinating on herself when she coughs or sneezes and at times she is unable to make it to the restroom.  Consistent with mixed incontinence picture.  Discussed with patient Kegel exercises.  We will plan to offer patient at her follow-up visit with PCP in 1 month.  Plan: -Give patient Kegel exercises during next visit in 1 month

## 2020-12-17 NOTE — Assessment & Plan Note (Addendum)
Assessment: Patient with longstanding history of peripheral neuropathy of unclear etiology.  Thought to be secondary to her nutritional deficiencies likely due to her esophageal stricture.  B12 in the past has been unremarkable and as well as work-up for thyroid disease.  She does endorse that the burning and tingling in her legs and feet would worsen with walking and she has to take breaks.  ABIs performed in office today were normal.  If this continues to persist consider repeat testing.  For now we will continue on her gabapentin, Dr. Alfonse Spruce recommended increasing gabapentin to 3 times daily for 300 mg dosing.  Creatinine under 1, kidney function unremarkable.  Can consider EMG testing moving forward.  She does endorse a history of periodic low back pain.  Denies any red flag symptoms such as loss of bowel or bladder function.  She does have stress incontinence.  No fevers or history of malignancy.  Low suspicion that this is secondary to herniated disc or stenosis of the spine causing nerve compression.  Plan: -Continue gabapentin 300 mg 3 times daily -Consider EMG moving forward as well as MRI of the spine to rule out claudication

## 2020-12-17 NOTE — Assessment & Plan Note (Signed)
Assessment: Patient continues to have weight loss secondary to decreased appetite likely due to her esophageal stricture.  Hopefully since dilation she will eat more now that she is no longer having dysphagia symptoms.  She is currently drinking a boost however this may not be enough.  Can consider nutrition consult for other supplement needs.  We will continue to monitor and have her follow-up with PCP in 1 month.  Of note her weight during today's visit was similar to her weight prior to admission.  Uncertain of accuracy of hospital weight.  Plan: -Continue boost supplements and to encourage increased p.o. intake -Consider nutrition consult at next visit.

## 2020-12-17 NOTE — Assessment & Plan Note (Signed)
Patient with long history of tobacco use.  She has quit in the past but restarted recently.  She is smoking 7 cigarettes/day.  She is interested in quitting.  We will start her on nicotine patches as well as gum.  Instructed patient on how to use, appropriately by chewing for bit and then talking away the back of the mouth.  If cravings return she can then chew more and then place a piece of gum back into corner pocket of mouth.  She was instructed to use the gum intermittently for cravings while using the patch once a day.  We will follow-up patient in 1 month and can consider Chantix as she has been on this in the past

## 2020-12-17 NOTE — Assessment & Plan Note (Signed)
Patient refused flu vaccination.  She is also saying stating that her handicap sticker will expire next month, will reach out to patient's PCP concerning this

## 2020-12-17 NOTE — Assessment & Plan Note (Signed)
Assessment: Patient with longstanding history of esophageal strictures with multiple dilations.  She was recently hospitalized and had endoscopy with dilation.  Since procedure she has been doing well she is not having any difficulty with swallowing or dysphagia symptoms.  She continues to drink boost to help with her low caloric intake.  Now that she is no longer having painful swallowing I suspect that she will have increasing appetite slowly over time.  Also suspect that this is likely the cause of her low folate in the past as well as her hypokalemia.  She has follow-up with GI for repeat endoscopy next month and then follow-up in their office in January.  She was reminded of these appointments and shown where they are in her discharge packet.  Plan: -Follow-up GI for repeat endoscopy and follow-up in their clinic in 2 months -If continues to have caloric intake deficiencies, consider nutrition consult

## 2020-12-17 NOTE — Assessment & Plan Note (Addendum)
Assessment: Positive CMV IgG during hospitalization, CMV IgM negative.  Suggesting prior infection that has resolved.  Plan: -No current active CMV infection, nothing to do

## 2020-12-17 NOTE — Progress Notes (Signed)
CC: Post hospital follow-up-esophageal stricture, hypokalemia, peripheral neuropathy  HPI:  [name] is a 55 y.o. female with a past medical history stated below and presents today for follow-up after recent hospitalization for esophageal stricture that was dilated.  She is now here to follow-up on chronic medical conditions of hypokalemia, peripheral neuropathy and tobacco use disorder. Please see problem based assessment and plan for additional details.  Past Medical History:  Diagnosis Date   ALLERGIC RHINITIS 10/10/2009   Qualifier: Diagnosis of  By: Guy Sandifer DO, Shelly     CANNABIS ABUSE 04/17/2008   Qualifier: Diagnosis of  By: Redmond Pulling  MD, Valerie     Chronic abdominal pain    Chronic Abd distension / bloating. W/U includes CT ABD & pelvis 7/08 negative. EGD 5/09 Dr Penelope Coop :erythematous gastric mucosa and bx c/w chronic gastritis - was neg for H pylori. Duo polyp had path c/w peptic duodenitis   Depression    DEPRESSION 08/05/2006   Qualifier: Diagnosis of  By: Redmond Pulling  MD, Valerie     Depression    GERD 08/05/2006   Qualifier: Diagnosis of  By: Redmond Pulling  MD, Luanne Bras, unspecified 10/16/2009   Qualifier: Diagnosis of  By: Guy Sandifer DO, Shelly     Hemorrhoids 01/18/2012   Hernia of abdominal cavity    INSOMNIA 10/15/2008   Qualifier: Diagnosis of  By: Redmond Pulling  MD, Valerie     Irritable bowel syndrome 08/06/2009   Qualifier: Diagnosis of  By: Criss Alvine     NICOTINE ADDICTION 07/06/2008   Started on Chantix.     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 (Patient not taking: No sig reported) 90 tablet 0   FLUoxetine (PROZAC) 20 MG capsule Take 2 capsules (40 mg total) by mouth daily. IM Program 30 capsule  0   OLANZapine (ZYPREXA) 20 MG tablet Take 20 mg by mouth at bedtime.     omeprazole (PRILOSEC) 20 MG capsule Take 1 capsule (20 mg total) by mouth daily. (Patient not taking: No sig reported) 90 capsule 3   venlafaxine XR (EFFEXOR-XR) 37.5 MG 24 hr capsule Take 37.5 mg by mouth daily with breakfast.     Vitamin D, Ergocalciferol, (DRISDOL) 1.25 MG (50000 UNIT) CAPS capsule Take 1 capsule (50,000 Units total) by mouth every 7 (seven) days for 5 doses. 5 capsule 0   [DISCONTINUED] mirtazapine (REMERON) 15 MG tablet Take 1 tablet (15 mg total) by mouth at bedtime. 30 tablet 2   No current facility-administered medications on file prior to visit.    Family History  Problem Relation Age of Onset   Cancer Mother 42       presumed breast ca   Cancer Paternal Grandmother        colon cancer   Cancer - Colon Other        Uncle    Colon cancer Maternal Grandmother    Esophageal cancer Neg Hx    Stomach cancer Neg Hx    Rectal cancer Neg Hx     Social History   Socioeconomic History   Marital status: Single    Spouse name: Not on file   Number of children: Not on file   Years of education:  Not on file   Highest education level: Not on file  Occupational History   Not on file  Tobacco Use   Smoking status: Former    Packs/day: 0.25    Types: Cigarettes    Quit date: 10/28/2019    Years since quitting: 1.1   Smokeless tobacco: Never   Tobacco comments:    6 cigs/day  Vaping Use   Vaping Use: Never used  Substance and Sexual Activity   Alcohol use: No    Alcohol/week: 0.0 standard drinks   Drug use: Not Currently    Comment: Marijuana.; former   Sexual activity: Not Currently    Birth control/protection: None  Other Topics Concern   Not on file  Social History Narrative   Lives with son. 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 In relationship. Has medicaid. Got GED. Smoked cigs 10-15 yrs 1PPD> Restarted 3 months in 2011 but quit after 3 months.   Social Determinants of Health    Financial Resource Strain: Not on file  Food Insecurity: Not on file  Transportation Needs: Not on file  Physical Activity: Not on file  Stress: Not on file  Social Connections: Not on file  Intimate Partner Violence: Not on file    Review of Systems: ROS negative except for what is noted on the assessment and plan.  Vitals:   12/17/20 0850  BP: (!) 122/91  Pulse: 100  Temp: 97.9 F (36.6 C)  TempSrc: Oral  SpO2: 99%  Weight: 96 lb 9.6 oz (43.8 kg)     Physical Exam: Constitutional: Thin, no acute distress HENT: normocephalic atraumatic Eyes: conjunctiva non-erythematous Neck: supple Cardiovascular: regular rate.  1+ dorsalis pedis pulses bilaterally Pulmonary/Chest: normal work of breathing on room air Neurological: alert & oriented x 3 Skin: warm and dry Psych: Normal mood and thought process   Assessment & Plan:   See Encounters Tab for problem based charting.  Patient discussed with Dr.  Butch Penny, D.O. [facility], PGY-2 Pager: [phone], Phone: [phone] Date 12/17/2020 Time 9:37 AM

## 2020-12-18 LAB — BMP8+ANION GAP
Anion Gap: 20 mmol/L — ABNORMAL HIGH (ref 10.0–18.0)
BUN/Creatinine Ratio: 4 — ABNORMAL LOW (ref 9–23)
BUN: 2 mg/dL — ABNORMAL LOW (ref 6–24)
CO2: 24 mmol/L (ref 20–29)
Calcium: 9.3 mg/dL (ref 8.7–10.2)
Chloride: 97 mmol/L (ref 96–106)
Creatinine, Ser: 0.55 mg/dL — ABNORMAL LOW (ref 0.57–1.00)
Glucose: 88 mg/dL (ref 70–99)
Potassium: 3.3 mmol/L — ABNORMAL LOW (ref 3.5–5.2)
Sodium: 141 mmol/L (ref 134–144)
eGFR: 110 mL/min/{1.73_m2} (ref >59)

## 2020-12-19 NOTE — Progress Notes (Signed)
Internal Medicine Clinic Attending  Case discussed with Dr. Katsadouros  At the time of the visit.  We reviewed the resident's history and exam and pertinent patient test results.  I agree with the assessment, diagnosis, and plan of care documented in the resident's note.  

## 2020-12-20 MED ORDER — POTASSIUM CHLORIDE 20 MEQ/15ML (10%) PO SOLN: 20.0000 meq | Freq: Every day | ORAL | 0 refills | Status: DC

## 2020-12-20 NOTE — Addendum Note (Signed)
Addended by: Riesa Pope on: 12/20/2020 01:23 PM   Modules accepted: Orders

## 2020-12-31 ENCOUNTER — Telehealth: Payer: Self-pay | Admitting: Gastroenterology

## 2020-12-31 ENCOUNTER — Other Ambulatory Visit: Payer: Self-pay

## 2020-12-31 ENCOUNTER — Encounter: Payer: Medicaid Other | Admitting: Gastroenterology

## 2020-12-31 NOTE — Telephone Encounter (Signed)
Good Morning Dr. Bryan Lemma,  This patient came in today and did not have a care partner. She Rescheduled or 01/15/21

## 2021-01-01 ENCOUNTER — Ambulatory Visit (INDEPENDENT_AMBULATORY_CARE_PROVIDER_SITE_OTHER): Payer: Medicaid Other | Admitting: Student

## 2021-01-01 ENCOUNTER — Encounter: Payer: Self-pay | Admitting: Student

## 2021-01-01 VITALS — BP 116/94 | HR 84 | Temp 98.4°F | Ht 64.0 in | Wt 95.9 lb

## 2021-01-01 DIAGNOSIS — E876 Hypokalemia: Secondary | ICD-10-CM

## 2021-01-01 DIAGNOSIS — R1310 Dysphagia, unspecified: Secondary | ICD-10-CM

## 2021-01-01 DIAGNOSIS — E559 Vitamin D deficiency, unspecified: Secondary | ICD-10-CM

## 2021-01-01 DIAGNOSIS — G6289 Other specified polyneuropathies: Secondary | ICD-10-CM | POA: Diagnosis not present

## 2021-01-01 DIAGNOSIS — G629 Polyneuropathy, unspecified: Secondary | ICD-10-CM

## 2021-01-01 DIAGNOSIS — Z0000 Encounter for general adult medical examination without abnormal findings: Secondary | ICD-10-CM

## 2021-01-01 MED ORDER — VITAMIN D (ERGOCALCIFEROL) 1.25 MG (50000 UNIT) PO CAPS: 50000.0000 [IU] | ORAL_CAPSULE | ORAL | 0 refills | Status: DC

## 2021-01-01 NOTE — Assessment & Plan Note (Addendum)
-   Recheck vitamin D level  Addendum Vitamin D improved to 42.  She no longer needs high-dose vitamin D.  Advised patient to obtain vitamin D over-the-counter from 600 to 2000 units daily.

## 2021-01-01 NOTE — Assessment & Plan Note (Signed)
-   She requested a female provider to do her Pap smear - She declined flu shot

## 2021-01-01 NOTE — Assessment & Plan Note (Signed)
Patient with history of dysphagia secondary to esophageal stenosis.  She went to GI for another EGD procedure yesterday, but unfortunately she drove there by herself so they have to cancel the procedure.  She is reschedule for 01/15/2021.  Patient endorses mild dysphagia.  She is tolerating soft food and liquid food.  She is also taking protein shakes supplements.  -Advised patient to keep her GI appointment.  She will have somebody driving her there for the procedure -Increase p.o. intake as tolerated

## 2021-01-01 NOTE — Assessment & Plan Note (Addendum)
Hypokalemia secondary to poor p.o. intake.  Last potassium 3.3.  -Recheck BMP today -Continue potassium supplement  Addendum Potassium improved to 3.6.  Encouraged p.o. intake

## 2021-01-01 NOTE — Patient Instructions (Signed)
Ms. Rosenwald,  It was a pleasure seeing you in the clinic today.  Here is a summary of what we talked about:  1.  Problems swallowing: Please follow-up with your gastroenterologist on 01/15/2021.  Make sure you have another person driving you there for the procedure.  2.  Nerve pain: I will order a nerve conduction study and refer you to neurology.  Continue gabapentin for your pain.  3.  I will get blood work for vitamin D and potassium.  4.  We will schedule you with a female provider For Pap smear.  Follow-up in 3 months  Take care,  Dr. Alfonse Spruce

## 2021-01-01 NOTE — Assessment & Plan Note (Addendum)
Patient endorses bilateral lower extremity pain that has been going on for a few months.  Endorses throbbing pain, tingling, numbness sensation especially of her bilateral feet.  States that pain started in the mid lower leg and traveled down to the bilateral feet.  She denies lower back pain or shooting pain.  Denies any neuropathy of her arms and hands.  She denies any trauma, procedure or radiation to her lower back or lower extremities.  Endorses normal strength.  Denies any balancing issue.  Assessment and plan Sickle exam reveals diminished sensation of bilateral feet, especially in the bottom of feet, left worse than right.  Intact deep tendon reflex.  Strength, range of motion and proprioception were normal.  ABI were checked at last visit and were normal.  B12, TSH and A1c were all within normal limits.  Unsure what is the underlying cause of her peripheral polyneuropathy.  Differential include chronic inflammatory demyelinating neuropathy vs idiopathic.  -Order nerve conduction test with [facility] -Referral to neurology -Continue gabapentin

## 2021-01-01 NOTE — Progress Notes (Signed)
° °  CC: Follow up on nutrition   HPI:  [name] is a 55 y.o. with past medical history of dysphagia secondary to esophageal stricture, hypokalemia who presents to the clinic today to follow up on her nutritional status.   Please see problem based charting for detail.   Past Medical History:  Diagnosis Date   ALLERGIC RHINITIS 10/10/2009   Qualifier: Diagnosis of  By: Guy Sandifer DO, Shelly     CANNABIS ABUSE 04/17/2008   Qualifier: Diagnosis of  By: Redmond Pulling  MD, Valerie     Chronic abdominal pain    Chronic Abd distension / bloating. W/U includes CT ABD & pelvis 7/08 negative. EGD 5/09 Dr Penelope Coop :erythematous gastric mucosa and bx c/w chronic gastritis - was neg for H pylori. Duo polyp had path c/w peptic duodenitis   Depression    DEPRESSION 08/05/2006   Qualifier: Diagnosis of  By: Redmond Pulling  MD, Valerie     Depression    GERD 08/05/2006   Qualifier: Diagnosis of  By: Redmond Pulling  MD, Luanne Bras, unspecified 10/16/2009   Qualifier: Diagnosis of  By: Guy Sandifer DO, Shelly     Hemorrhoids 01/18/2012   Hernia of abdominal cavity    INSOMNIA 10/15/2008   Qualifier: Diagnosis of  By: Redmond Pulling  MD, Valerie     Irritable bowel syndrome 08/06/2009   Qualifier: Diagnosis of  By: Criss Alvine     NICOTINE ADDICTION 07/06/2008   Started on Chantix.     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    Appearance: She is not ill-appearing.  HENT:     Head: Normocephalic.  Eyes:     General:        Right eye: No discharge.        Left eye: No discharge.  Cardiovascular:     Rate and Rhythm: Normal rate and regular rhythm.     Heart  sounds: Normal heart sounds.  Pulmonary:     Effort: Pulmonary effort is normal. No respiratory distress.     Breath sounds: Normal breath sounds. No wheezing.  Musculoskeletal:     Comments: Diminished sensation in the bottom bilateral feet, left worse than right.  Normal sensation in the dorsum surface.  Normal strength of bilateral lower extremity.  Normal range of motion.  +2 pedal pulses bilaterally.  Feet are warm to touch.  No loss of balance when standing with eye closed.  No back pain.  Skin:    General: Skin is warm.  Neurological:     General: No focal deficit present.     Mental Status: She is alert and oriented to person, place, and time.  Psychiatric:        Mood and Affect: Mood normal.        Behavior: Behavior normal.     Assessment & Plan:   See Encounters Tab for problem based charting.  Patient discussed with Dr. Dareen Piano

## 2021-01-02 LAB — BMP8+ANION GAP
Anion Gap: 19 mmol/L — ABNORMAL HIGH (ref 10.0–18.0)
BUN/Creatinine Ratio: 10 (ref 9–23)
BUN: 5 mg/dL — ABNORMAL LOW (ref 6–24)
CO2: 24 mmol/L (ref 20–29)
Calcium: 9.5 mg/dL (ref 8.7–10.2)
Chloride: 100 mmol/L (ref 96–106)
Creatinine, Ser: 0.49 mg/dL — ABNORMAL LOW (ref 0.57–1.00)
Glucose: 85 mg/dL (ref 70–99)
Potassium: 3.6 mmol/L (ref 3.5–5.2)
Sodium: 143 mmol/L (ref 134–144)
eGFR: 113 mL/min/{1.73_m2} (ref >59)

## 2021-01-02 LAB — VITAMIN D 25 HYDROXY (VIT D DEFICIENCY, FRACTURES): Vit D, 25-Hydroxy: 42.7 ng/mL (ref 30.0–100.0)

## 2021-01-02 NOTE — Progress Notes (Signed)
Internal Medicine Clinic Attending  Case discussed with Dr. Nguyen  At the time of the visit.  We reviewed the resident's history and exam and pertinent patient test results.  I agree with the assessment, diagnosis, and plan of care documented in the resident's note. 

## 2021-01-02 NOTE — Addendum Note (Signed)
Addended byGaylan Gerold on: 01/02/2021 09:54 AM   Modules accepted: Orders

## 2021-01-15 ENCOUNTER — Ambulatory Visit (AMBULATORY_SURGERY_CENTER): Payer: Medicaid Other | Admitting: Gastroenterology

## 2021-01-15 ENCOUNTER — Encounter: Payer: Self-pay | Admitting: Gastroenterology

## 2021-01-15 VITALS — BP 126/88 | HR 87 | Temp 97.7°F | Resp 20 | Ht 64.0 in | Wt 96.0 lb

## 2021-01-15 DIAGNOSIS — K219 Gastro-esophageal reflux disease without esophagitis: Secondary | ICD-10-CM | POA: Diagnosis not present

## 2021-01-15 DIAGNOSIS — K2990 Gastroduodenitis, unspecified, without bleeding: Secondary | ICD-10-CM | POA: Diagnosis not present

## 2021-01-15 DIAGNOSIS — K2970 Gastritis, unspecified, without bleeding: Secondary | ICD-10-CM

## 2021-01-15 DIAGNOSIS — K222 Esophageal obstruction: Secondary | ICD-10-CM | POA: Diagnosis not present

## 2021-01-15 DIAGNOSIS — K208 Other esophagitis without bleeding: Secondary | ICD-10-CM | POA: Diagnosis not present

## 2021-01-15 DIAGNOSIS — R1310 Dysphagia, unspecified: Secondary | ICD-10-CM | POA: Diagnosis not present

## 2021-01-15 MED ORDER — SODIUM CHLORIDE 0.9 % IV SOLN: 500.0000 mL | Freq: Once | INTRAVENOUS | Status: DC

## 2021-01-15 NOTE — Patient Instructions (Signed)
Handouts on esophagitis and stricture w/ Post dilations diet.  YOU HAD AN ENDOSCOPIC PROCEDURE TODAY AT [facility]:   Refer to the procedure report that was given to you for any specific questions about what was found during the examination.  If the procedure report does not answer your questions, please call your gastroenterologist to clarify.  If you requested that your care partner not be given the details of your procedure findings, then the procedure report has been included in a sealed envelope for you to review at your convenience later.  YOU SHOULD EXPECT: Some feelings of bloating in the abdomen. Passage of more gas than usual.  Walking can help get rid of the air that was put into your GI tract during the procedure and reduce the bloating. If you had a lower endoscopy (such as a colonoscopy or flexible sigmoidoscopy) you may notice spotting of blood in your stool or on the toilet paper. If you underwent a bowel prep for your procedure, you may not have a normal bowel movement for a few days.  Please Note:  You might notice some irritation and congestion in your nose or some drainage.  This is from the oxygen used during your procedure.  There is no need for concern and it should clear up in a day or so.  SYMPTOMS TO REPORT IMMEDIATELY:   Following upper endoscopy (EGD)  Vomiting of blood or coffee ground material  New chest pain or pain under the shoulder blades  Painful or persistently difficult swallowing  New shortness of breath  Fever of 100F or higher  Black, tarry-looking stools  For urgent or emergent issues, a gastroenterologist can be reached at any hour by calling [phone]. Do not use MyChart messaging for urgent concerns.    DIET:  Nothing by mouth until 1130, then clear liquids until 1230.  Soft diet the remainder of today.  You may proceed to your regular diet tomorrow.  Drink plenty of fluids but you should avoid alcoholic beverages for 24  hours.  ACTIVITY:  You should plan to take it easy for the rest of today and you should NOT DRIVE or use heavy machinery until tomorrow (because of the sedation medicines used during the test).    FOLLOW UP: Our staff will call the number listed on your records 48-72 hours following your procedure to check on you and address any questions or concerns that you may have regarding the information given to you following your procedure. If we do not reach you, we will leave a message.  We will attempt to reach you two times.  During this call, we will ask if you have developed any symptoms of COVID 19. If you develop any symptoms (ie: fever, flu-like symptoms, shortness of breath, cough etc.) before then, please call [phone].  If you test positive for Covid 19 in the 2 weeks post procedure, please call and report this information to [place].    If any biopsies were taken you will be contacted by phone or by letter within the next 1-3 weeks.  Please call us at [phone] if you have not heard about the biopsies in 3 weeks.    SIGNATURES/CONFIDENTIALITY: You and/or your care partner have signed paperwork which will be entered into your electronic medical record.  These signatures attest to the fact that that the information above on your After Visit Summary has been reviewed and is understood.  Full responsibility of the confidentiality of this discharge information lies  with you and/or your care-partner.

## 2021-01-15 NOTE — Progress Notes (Signed)
VS by [facility]

## 2021-01-15 NOTE — Progress Notes (Signed)
To PACU, VSS. Report to [email or web address].tb 

## 2021-01-15 NOTE — Op Note (Signed)
[facility] Patient Name: Arbor Leer Procedure Date: 01/15/2021 10:08 AM MRN: [id number] Endoscopist: Gerrit Heck , MD Age: 55 Referring MD:  Date of Birth: [birth year] Gender: Female Account #: [id number] Procedure:                Upper GI endoscopy Indications:              Dysphagia, Stricture of the esophagus, For therapy                            of esophageal stricture                           - EGD (11/30/2020): Moderate stenosis at 16 cm                            dilated with endoscope alone, Schatzki's ring                            dilated with 18 mm TTS balloon then fractured with                            forceps, Hill grade 3 valve, gastric inlet patch,                            normal stomach/duodenum                           -MBS (11/29/2020): Decrease cricopharyngeal/UES                            relaxation and barium tablet lodged at UES                           - EGD (02/2020): Stenosis at 16 cm dilated with 16                            mm Savary with mucosal rent. Schatzki's ring                            dilated with 16 mm Savary then fractured with cold                            forceps. 2 cm HH, moderate duodenitis. Benign                            gastric inlet patch. Treated with Prilosec 20 mg                            bid. Medicines:                Monitored Anesthesia Care Procedure:                Pre-Anesthesia Assessment:                           -  Prior to the procedure, a History and Physical                            was performed, and patient medications and                            allergies were reviewed. The patient's tolerance of                            previous anesthesia was also reviewed. The risks                            and benefits of the procedure and the sedation                            options and risks were discussed with the patient.                            All questions were  answered, and informed consent                            was obtained. Prior Anticoagulants: The patient has                            taken no previous anticoagulant or antiplatelet                            agents. ASA Grade Assessment: II - A patient with                            mild systemic disease. After reviewing the risks                            and benefits, the patient was deemed in                            satisfactory condition to undergo the procedure.                           After obtaining informed consent, the endoscope was                            passed under direct vision. Throughout the                            procedure, the patient's blood pressure, pulse, and                            oxygen saturations were monitored continuously. The                            Endoscope was introduced through the mouth, and  advanced to the second part of duodenum. The upper                            GI endoscopy was accomplished without difficulty.                            The patient tolerated the procedure well. Scope In: Scope Out: Findings:                 One benign-appearing, intrinsic mild stenosis was                            found 16 cm from the incisors. This stenosis                            measured 1 cm (in length). The stenosis was                            traversed. A guidewire was placed and the scope was                            withdrawn. Dilation was performed with a Savary                            dilator with mild resistance at 16 mm. The dilation                            site was examined following endoscope reinsertion                            and showed mild mucosal disruption and moderate                            improvement in luminal narrowing. Estimated blood                            loss was minimal.                           A single area of ectopic gastric mucosa was found                             in the upper third of the esophagus.                           A non-obstructing Schatzki ring was found in the                            lower third of the esophagus. A guidewire was                            placed and the scope was withdrawn. Dilation was  performed with a Savary dilator with mild                            resistance at 16 mm. The dilation site was examined                            following endoscope reinsertion and showed no                            bleeding, mucosal tear or perforation. This was                            then biopsied with a cold forceps for dual purposes                            of histology and fracturing of the ring. Estimated                            blood loss was minimal.                           The Z-line was regular and was found 35 cm from the                            incisors.                           Mild inflammation characterized by erythema was                            found in the gastric body and in the gastric                            antrum. Biopsies were taken with a cold forceps for                            Helicobacter pylori testing. Estimated blood loss                            was minimal.                           The examined duodenum was normal. Complications:            No immediate complications. Estimated Blood Loss:     Estimated blood loss was minimal. Impression:               - Benign-appearing esophageal stenosis. Dilated wih                            16 mm Savary dilator with appropriate mucosal rent.                           - Ectopic gastric mucosa in the upper third of the  esophagus.                           - Non-obstructing Schatzki ring. Dilated. Biopsied.                           - Z-line regular, 35 cm from the incisors.                           - Gastritis. Biopsied.                           - Normal examined  duodenum. Recommendation:           - Patient has a contact number available for                            emergencies. The signs and symptoms of potential                            delayed complications were discussed with the                            patient. Return to normal activities tomorrow.                            Written discharge instructions were provided to the                            patient.                           - Soft diet today then advance as tolerated                            tomorrow per post dilation protocol.                           - Continue present medications.                           - Await pathology results.                           - Repeat upper endoscopy PRN for retreatment.                           - Return to GI clinic PRN. Gerrit Heck, MD 01/15/2021 10:37:24 AM

## 2021-01-15 NOTE — Progress Notes (Signed)
Called to room to assist during endoscopic procedure.  Patient ID and intended procedure confirmed with present staff. Received instructions for my participation in the procedure from the performing physician.  
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
## 2021-01-17 ENCOUNTER — Telehealth: Payer: Self-pay | Admitting: *Deleted

## 2021-01-17 NOTE — Telephone Encounter (Signed)
°  Follow up Call-  Call back number 01/15/2021 03/08/2020  Post procedure Call Back phone  # [phone]  Permission to leave phone message Yes Yes  Some recent data might be hidden     Patient questions:  Do you have a fever, pain , or abdominal swelling? No. Pain Score  0 *  Have you tolerated food without any problems? Yes.    Have you been able to return to your normal activities? Yes.    Do you have any questions about your discharge instructions: Diet   No. Medications  No. Follow up visit  No.  Do you have questions or concerns about your Care? No.  Actions: * If pain score is 4 or above: No action needed, pain <4.  Have you developed a fever since your procedure? no  2.   Have you had an respiratory symptoms (SOB or cough) since your procedure? no  3.   Have you tested positive for COVID 19 since your procedure no  4.   Have you had any family members/close contacts diagnosed with the COVID 19 since your procedure?  no   If yes to any of these questions please route to Joylene John, RN and Joella Prince, RN

## 2021-01-22 ENCOUNTER — Encounter: Payer: Self-pay | Admitting: Gastroenterology

## 2021-02-03 ENCOUNTER — Ambulatory Visit: Payer: Medicaid Other | Admitting: Gastroenterology

## 2021-02-06 ENCOUNTER — Other Ambulatory Visit: Payer: Self-pay

## 2021-02-06 ENCOUNTER — Emergency Department (HOSPITAL_COMMUNITY): Payer: Medicaid Other

## 2021-02-06 ENCOUNTER — Emergency Department (HOSPITAL_COMMUNITY)
Admission: EM | Admit: 2021-02-06 | Discharge: 2021-02-07 | Disposition: A | Discharge status: Home / Self Care | Payer: Medicaid Other | Attending: Student | Admitting: Student

## 2021-02-06 DIAGNOSIS — Z20822 Contact with and (suspected) exposure to covid-19: Secondary | ICD-10-CM | POA: Diagnosis not present

## 2021-02-06 DIAGNOSIS — W01198A Fall on same level from slipping, tripping and stumbling with subsequent striking against other object, initial encounter: Secondary | ICD-10-CM | POA: Insufficient documentation

## 2021-02-06 DIAGNOSIS — R4182 Altered mental status, unspecified: Secondary | ICD-10-CM | POA: Insufficient documentation

## 2021-02-06 DIAGNOSIS — Z5321 Procedure and treatment not carried out due to patient leaving prior to being seen by health care provider: Secondary | ICD-10-CM | POA: Insufficient documentation

## 2021-02-06 LAB — COMPREHENSIVE METABOLIC PANEL
ALT: 20 U/L (ref 0–44)
AST: 42 U/L — ABNORMAL HIGH (ref 15–41)
Albumin: 3.6 g/dL (ref 3.5–5.0)
Alkaline Phosphatase: 68 U/L (ref 38–126)
Anion gap: 16 — ABNORMAL HIGH (ref 5–15)
BUN: 5 mg/dL — ABNORMAL LOW (ref 6–20)
CO2: 20 mmol/L — ABNORMAL LOW (ref 22–32)
Calcium: 9 mg/dL (ref 8.9–10.3)
Chloride: 104 mmol/L (ref 98–111)
Creatinine, Ser: 0.67 mg/dL (ref 0.44–1.00)
GFR, Estimated: >60 mL/min (ref >60)
Glucose, Bld: 106 mg/dL — ABNORMAL HIGH (ref 70–99)
Potassium: 4.1 mmol/L (ref 3.5–5.1)
Sodium: 140 mmol/L (ref 135–145)
Total Bilirubin: 0.6 mg/dL (ref 0.3–1.2)
Total Protein: 6 g/dL — ABNORMAL LOW (ref 6.5–8.1)

## 2021-02-06 LAB — URINALYSIS, ROUTINE W REFLEX MICROSCOPIC
Bilirubin Urine: NEGATIVE
Glucose, UA: NEGATIVE mg/dL
Hgb urine dipstick: NEGATIVE
Ketones, ur: NEGATIVE mg/dL
Nitrite: NEGATIVE
Protein, ur: NEGATIVE mg/dL
Specific Gravity, Urine: 1.011 (ref 1.005–1.030)
pH: 6 (ref 5.0–8.0)

## 2021-02-06 LAB — I-STAT CHEM 8, ED
BUN: 4 mg/dL — ABNORMAL LOW (ref 6–20)
Calcium, Ion: 0.97 mmol/L — ABNORMAL LOW (ref 1.15–1.40)
Chloride: 106 mmol/L (ref 98–111)
Creatinine, Ser: 0.6 mg/dL (ref 0.44–1.00)
Glucose, Bld: 101 mg/dL — ABNORMAL HIGH (ref 70–99)
HCT: 47 % — ABNORMAL HIGH (ref 36.0–46.0)
Hemoglobin: 16 g/dL — ABNORMAL HIGH (ref 12.0–15.0)
Potassium: 2.7 mmol/L — CL (ref 3.5–5.1)
Sodium: 138 mmol/L (ref 135–145)
TCO2: 19 mmol/L — ABNORMAL LOW (ref 22–32)

## 2021-02-06 LAB — CBC
HCT: 44.7 % (ref 36.0–46.0)
Hemoglobin: 14.8 g/dL (ref 12.0–15.0)
MCH: 35.2 pg — ABNORMAL HIGH (ref 26.0–34.0)
MCHC: 33.1 g/dL (ref 30.0–36.0)
MCV: 106.4 fL — ABNORMAL HIGH (ref 80.0–100.0)
Platelets: 253 10*3/uL (ref 150–400)
RBC: 4.2 MIL/uL (ref 3.87–5.11)
RDW: 14.6 % (ref 11.5–15.5)
WBC: 5.4 10*3/uL (ref 4.0–10.5)
nRBC: 0 % (ref 0.0–0.2)

## 2021-02-06 LAB — DIFFERENTIAL
Abs Immature Granulocytes: 0.01 10*3/uL (ref 0.00–0.07)
Basophils Absolute: 0 10*3/uL (ref 0.0–0.1)
Basophils Relative: 0 %
Eosinophils Absolute: 0 10*3/uL (ref 0.0–0.5)
Eosinophils Relative: 0 %
Immature Granulocytes: 0 %
Lymphocytes Relative: 43 %
Lymphs Abs: 2.3 10*3/uL (ref 0.7–4.0)
Monocytes Absolute: 0.3 10*3/uL (ref 0.1–1.0)
Monocytes Relative: 6 %
Neutro Abs: 2.7 10*3/uL (ref 1.7–7.7)
Neutrophils Relative %: 51 %

## 2021-02-06 LAB — I-STAT BETA HCG BLOOD, ED (MC, WL, AP ONLY): I-stat hCG, quantitative: 12.2 m[IU]/mL — ABNORMAL HIGH (ref <5)

## 2021-02-06 LAB — RAPID URINE DRUG SCREEN, HOSP PERFORMED
Amphetamines: NOT DETECTED
Barbiturates: NOT DETECTED
Benzodiazepines: NOT DETECTED
Cocaine: NOT DETECTED
Opiates: NOT DETECTED
Tetrahydrocannabinol: POSITIVE — AB

## 2021-02-06 LAB — CBG MONITORING, ED: Glucose-Capillary: 124 mg/dL — ABNORMAL HIGH (ref 70–99)

## 2021-02-06 LAB — APTT: aPTT: 27 seconds (ref 24–36)

## 2021-02-06 LAB — PROTIME-INR
INR: 1 (ref 0.8–1.2)
Prothrombin Time: 13.5 seconds (ref 11.4–15.2)

## 2021-02-06 LAB — RESP PANEL BY RT-PCR (FLU A&B, COVID) ARPGX2
Influenza A by PCR: NEGATIVE
Influenza B by PCR: NEGATIVE
SARS Coronavirus 2 by RT PCR: NEGATIVE

## 2021-02-06 LAB — ETHANOL: Alcohol, Ethyl (B): 79 mg/dL — ABNORMAL HIGH (ref <10)
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
## 2021-02-06 NOTE — ED Provider Triage Note (Signed)
Emergency Medicine Provider Triage Evaluation Note  Stephanie Padilla , a 56 y.o. female  was evaluated in triage.  Pt complains of altered mental status.  Unknown last known normal, on the phone with daughter started having difficulty answering questions and coming up with words.  She had a fall this started.  Review of Systems  Positive: AMS, FALL Negative: syncope  Physical Exam  BP 108/86 (BP Location: Right Arm)    Pulse (!) 105    Temp 98.5 F (36.9 C) (Oral)    Resp 14    Ht 5\' 4"  (1.626 m)    Wt 43.5 kg    SpO2 99%    BMI 16.48 kg/m  Gen:   Awake, no distress   Resp:  Normal effort  MSK:   Moves extremities without difficulty  Other:  Artificial right eye.  EOMI, cranial nerves II through XII grossly intact.  Grip strength is equal bilaterally, no pronator drift.  Patient does not follow commands well.  No dysarthria  Medical Decision Making  Medically screening exam initiated at 10:05 PM.  Appropriate orders placed.  Stephanie Padilla was informed that the remainder of the evaluation will be completed by another provider, this initial triage assessment does not replace that evaluation, and the importance of remaining in the ED until their evaluation is complete.  Not a code stroke.    Sherrill Raring, PA-C 02/06/21 [id number]

## 2021-02-07 NOTE — ED Notes (Signed)
LWBS 

## 2021-03-13 ENCOUNTER — Ambulatory Visit: Payer: Medicaid Other | Admitting: Neurology

## 2021-04-24 ENCOUNTER — Ambulatory Visit: Payer: Medicaid Other | Admitting: Neurology

## 2021-04-24 ENCOUNTER — Encounter: Payer: Self-pay | Admitting: Neurology

## 2021-04-24 VITALS — BP 90/69 | HR 71 | Ht 64.0 in | Wt 94.8 lb

## 2021-04-24 DIAGNOSIS — G629 Polyneuropathy, unspecified: Secondary | ICD-10-CM | POA: Diagnosis not present

## 2021-04-24 DIAGNOSIS — R200 Anesthesia of skin: Secondary | ICD-10-CM | POA: Diagnosis not present

## 2021-04-24 DIAGNOSIS — R202 Paresthesia of skin: Secondary | ICD-10-CM | POA: Diagnosis not present
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
## 2021-04-24 NOTE — Patient Instructions (Addendum)
Blood work ?Call to schedule for emg/ncs ? ?Peripheral Neuropathy ?Peripheral neuropathy is a type of nerve damage. It affects nerves that carry signals between the spinal cord and the arms, legs, and the rest of the body (peripheral nerves). It does not affect nerves in the spinal cord or brain. In peripheral neuropathy, one nerve or a group of nerves may be damaged. Peripheral neuropathy is a broad category that includes many specific nerve disorders, like diabetic neuropathy, hereditary neuropathy, and carpal tunnel syndrome. ?What are the causes? ?This condition may be caused by: ?Diabetes. This is the most common cause of peripheral neuropathy. ?Nerve injury. ?Pressure or stress on a nerve that lasts a long time. ?Lack (deficiency) of B vitamins. This can result from alcoholism, poor diet, or a restricted diet. ?Infections. ?Autoimmune diseases, such as rheumatoid arthritis and systemic lupus erythematosus. ?Nerve diseases that are passed from parent to child (inherited). ?Some medicines, such as cancer medicines (chemotherapy). ?Poisonous (toxic) substances, such as lead and mercury. ?Too little blood flowing to the legs. ?Kidney disease. ?Thyroid disease. ?smoking ?In some cases, the cause of this condition is not known. ?What are the signs or symptoms? ?Symptoms of this condition depend on which of your nerves is damaged. Common symptoms include: ?Loss of feeling (numbness) in the feet, hands, or both. ?Tingling in the feet, hands, or both. ?Burning pain. ?Very sensitive skin. ?Weakness. ?Not being able to move a part of the body (paralysis). ?Muscle twitching. ?Clumsiness or poor coordination. ?Loss of balance. ?Not being able to control your bladder. ?Feeling dizzy. ?Sexual problems. ?How is this diagnosed? ?Diagnosing and finding the cause of peripheral neuropathy can be difficult. Your health care provider will take your medical history and do a physical exam. A neurological exam will also be done. This  involves checking things that are affected by your brain, spinal cord, and nerves (nervous system). For example, your health care provider will check your reflexes, how you move, and what you can feel. ?You may have other tests, such as: ?Blood tests. ?Electromyogram (EMG) and nerve conduction tests. These tests check nerve function and how well the nerves are controlling the muscles. ?Imaging tests, such as CT scans or MRI to rule out other causes of your symptoms. ?Removing a small piece of nerve to be examined in a lab (nerve biopsy). ?Removing and examining a small amount of the fluid that surrounds the brain and spinal cord (lumbar puncture). ?How is this treated? ?Treatment for this condition may involve: ?Treating the underlying cause of the neuropathy, such as diabetes, kidney disease, or vitamin deficiencies. ?Stopping medicines that can cause neuropathy, such as chemotherapy. ?Medicine to help relieve pain. Medicines may include: ?Prescription or over-the-counter pain medicine. ?Antiseizure medicine. ?Antidepressants. ?Pain-relieving patches that are applied to painful areas of skin. ?Surgery to relieve pressure on a nerve or to destroy a nerve that is causing pain. ?Physical therapy to help improve movement and balance. ?Devices to help you move around (assistive devices). ?Follow these instructions at home: ?Medicines ?Take over-the-counter and prescription medicines only as told by your health care provider. Do not take any other medicines without first asking your health care provider. ?Do not drive or use heavy machinery while taking prescription pain medicine. ?Lifestyle ? ?Do not use any products that contain nicotine or tobacco, such as cigarettes and e-cigarettes. Smoking keeps blood from reaching damaged nerves. If you need help quitting, ask your health care provider. ?Avoid or limit alcohol. Too much alcohol can cause a vitamin B deficiency, and  vitamin B is needed for healthy nerves. ?Eat a  healthy diet. This includes: ?Eating foods that are high in fiber, such as fresh fruits and vegetables, whole grains, and beans. ?Limiting foods that are high in fat and processed sugars, such as fried or sweet foods. ?General instructions ? ?If you have diabetes, work closely with your health care provider to keep your blood sugar under control. ?If you have numbness in your feet: ?Check every day for signs of injury or infection. Watch for redness, warmth, and swelling. ?Wear padded socks and comfortable shoes. These help protect your feet. ?Develop a good support system. Living with peripheral neuropathy can be stressful. Consider talking with a mental health specialist or joining a support group. ?Use assistive devices and attend physical therapy as told by your health care provider. This may include using a walker or a cane. ?Keep all follow-up visits as told by your health care provider. This is important. ?Contact a health care provider if: ?You have new signs or symptoms of peripheral neuropathy. ?You are struggling emotionally from dealing with peripheral neuropathy. ?Your pain is not well-controlled. ?Get help right away if: ?You have an injury or infection that is not healing normally. ?You develop new weakness in an arm or leg. ?You have fallen or do so frequently. ?Summary ?Peripheral neuropathy is when the nerves in the arms, or legs are damaged, resulting in numbness, weakness, or pain. ?There are many causes of peripheral neuropathy, including diabetes, pinched nerves, vitamin deficiencies, autoimmune disease, and hereditary conditions. ?Diagnosing and finding the cause of peripheral neuropathy can be difficult. Your health care provider will take your medical history, do a physical exam, and do tests, including blood tests and nerve function tests. ?Treatment involves treating the underlying cause of the neuropathy and taking medicines to help control pain. Physical therapy and assistive devices may  also help. ?This information is not intended to replace advice given to you by your health care provider. Make sure you discuss any questions you have with your health care provider. ? ?Electromyoneurogram ?Electromyoneurogram is a test to check how well your muscles and nerves are working. This procedure includes the combined use of electromyogram (EMG) and nerve conduction study (NCS). EMG is used to evaluate muscles and the nerves that control those muscles. NCS, which is also called electroneurogram, measures how well your nerves conduct electricity. ?The procedures should be done together to check if your muscles and nerves are healthy. If the results of the tests are abnormal, this may indicate disease or injury, such as a neuromuscular disease or peripheral nerve damage. ?Tell a health care provider about: ?Any allergies you have. ?All medicines you are taking, including vitamins, herbs, eye drops, creams, and over-the-counter medicines. ?Any bleeding problems you have. ?Any surgeries you have had. ?Any medical conditions you have. ?What are the risks? ?Generally, this is a safe procedure. However, problems may occur, including: ?Bleeding or bruising. ?Infection where the electrodes were inserted. ?What happens before the test? ?Medicines ?Take all of your usually prescribed medications before this testing is performed. Do not stop your blood thinners unless advised by your prescribing physician. ?General instructions ?Your health care provider may ask you to warm the limb that will be checked with warm water, hot pack, or wrapping the limb in a blanket. ?Do not use lotions or creams on the same day that you will be having the procedure. ?What happens during the test? ?For EMG ? ?Your health care provider will ask you to stay in  a position so that the muscle being studied can be accessed. You will be sitting or lying down. ?You may be given a medicine to numb the area (local anesthetic) and the skin will be  disinfected. ?A very thin needle that has an electrode will be inserted into your muscle, one muscle at a time. Typically, multiple muscles are evaluated during a single study. ?Another small electrode

## 2021-04-26 IMAGING — RF DG ESOPHAGUS
15 of 21 series · 15 of 24 positions shown · non-contrast
Comparison: Swallow evaluation of the same date.

CLINICAL DATA: Vomiting following attempted swallow of tablet
during a speech evaluation. Narrowing of the upper cervical
esophagus noted on speech evaluation. Also with history of weight
loss.

EXAM:
ESOPHOGRAM/BARIUM SWALLOW
TECHNIQUE: Single contrast examination was performed using  thin barium.
FLUOROSCOPY TIME:  Fluoroscopy Time:  3 minutes 48 seconds
Radiation Exposure Index (if provided by the fluoroscopic device):
11.6 mGy
Number of Acquired Spot Images: 3

[Series 1: cp_standard · 0.34mm/px · 1 of 35 frames shown (1 of 14)]
[frame 6/35]
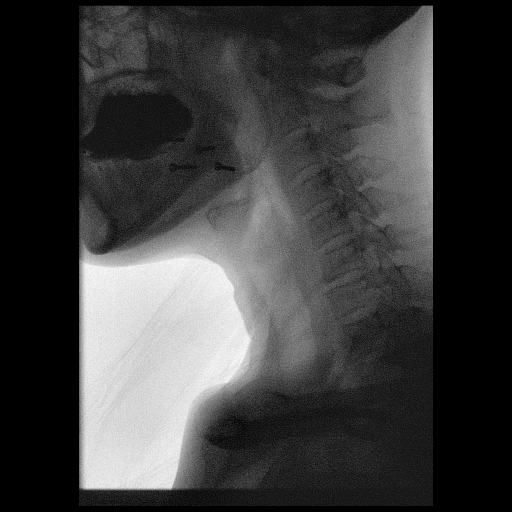

[Series 3: cp_standard · 0.34mm/px · 1 of 8 frames shown (2 of 14)]
[frame 2/8]
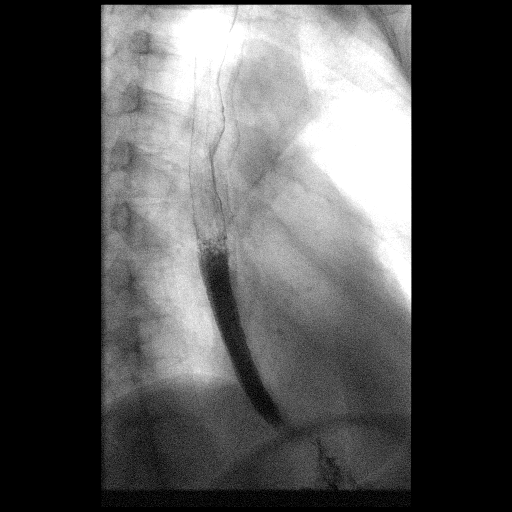

[Series 4: cp_standard · 0.34mm/px · 1 of 13 frames shown (3 of 14)]
[frame 12/13]
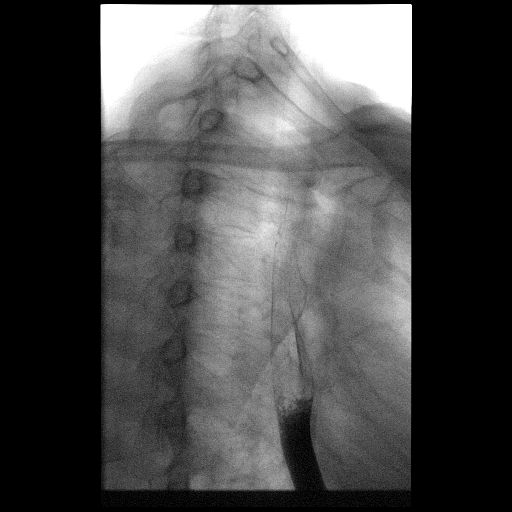

[Series 5: cp_standard · 0.34mm/px · 1 of 11 frames shown (4 of 14)]
[frame 6/11]
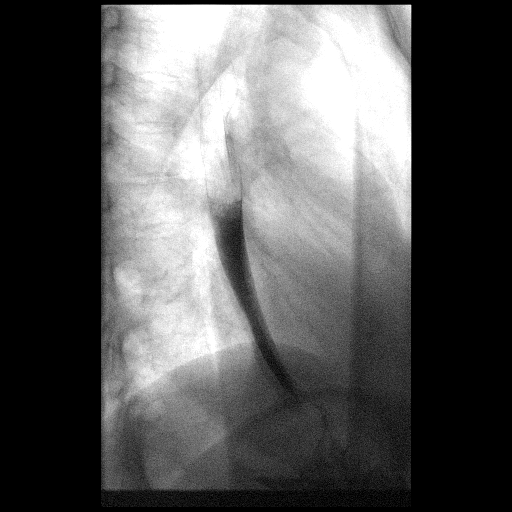

[Series 7: cp_standard · 0.34mm/px · 1 of 13 frames shown (5 of 14)]
[frame 6/13]
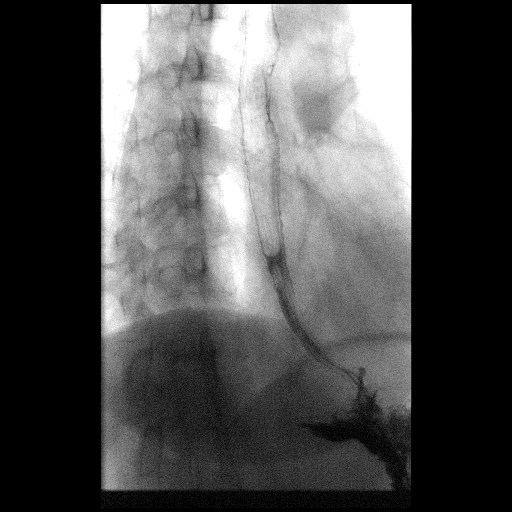

[Series 8: cp_standard · 0.34mm/px · 1 of 30 frames shown (6 of 14)]
[frame 7/30]
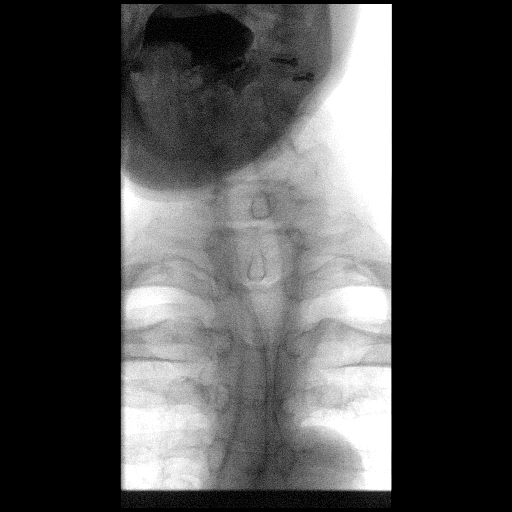

[Series 10: cp_standard · 0.34mm/px · 1 of 4 frames shown (7 of 14)]
[frame 4/4]
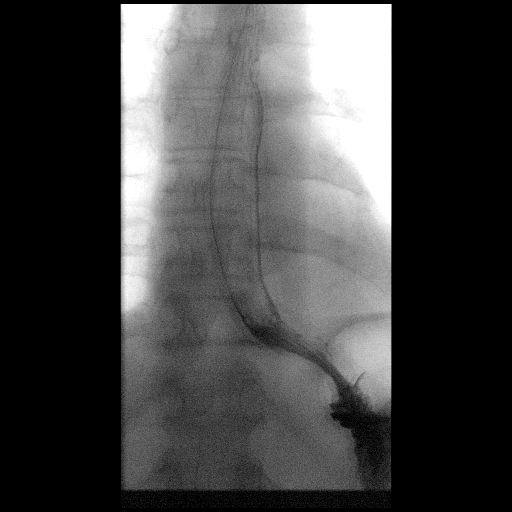

[Series 12: cp_standard · 0.34mm/px · 1 of 7 frames shown (8 of 14)]
[frame 6/7]
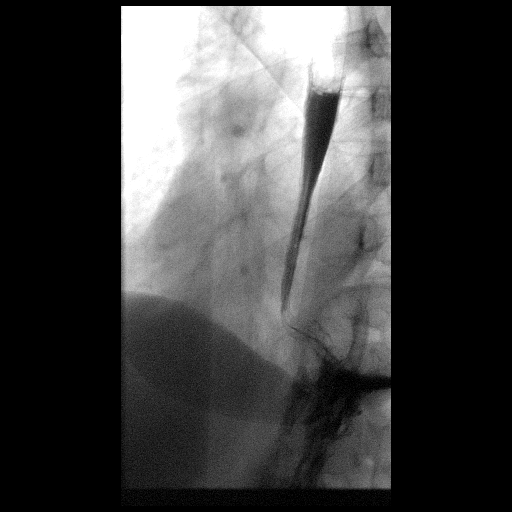

[Series 13: cp_standard · 0.34mm/px · 1 of 7 frames shown (9 of 14)]
[frame 7/7]
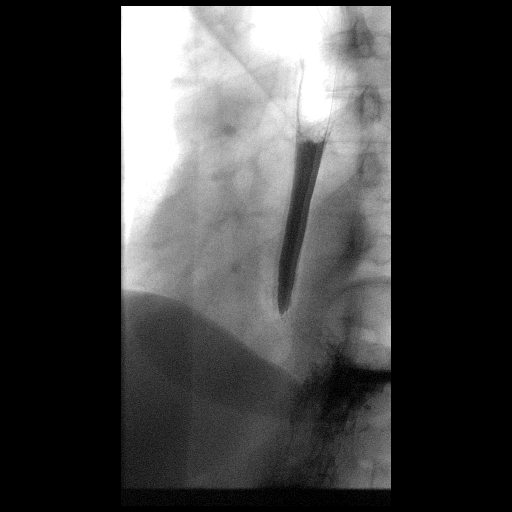

[Series 16: fluoro_barium 2fps_bw · 0.17mm/px · 1 of 1 slices shown]
[im 1/1]
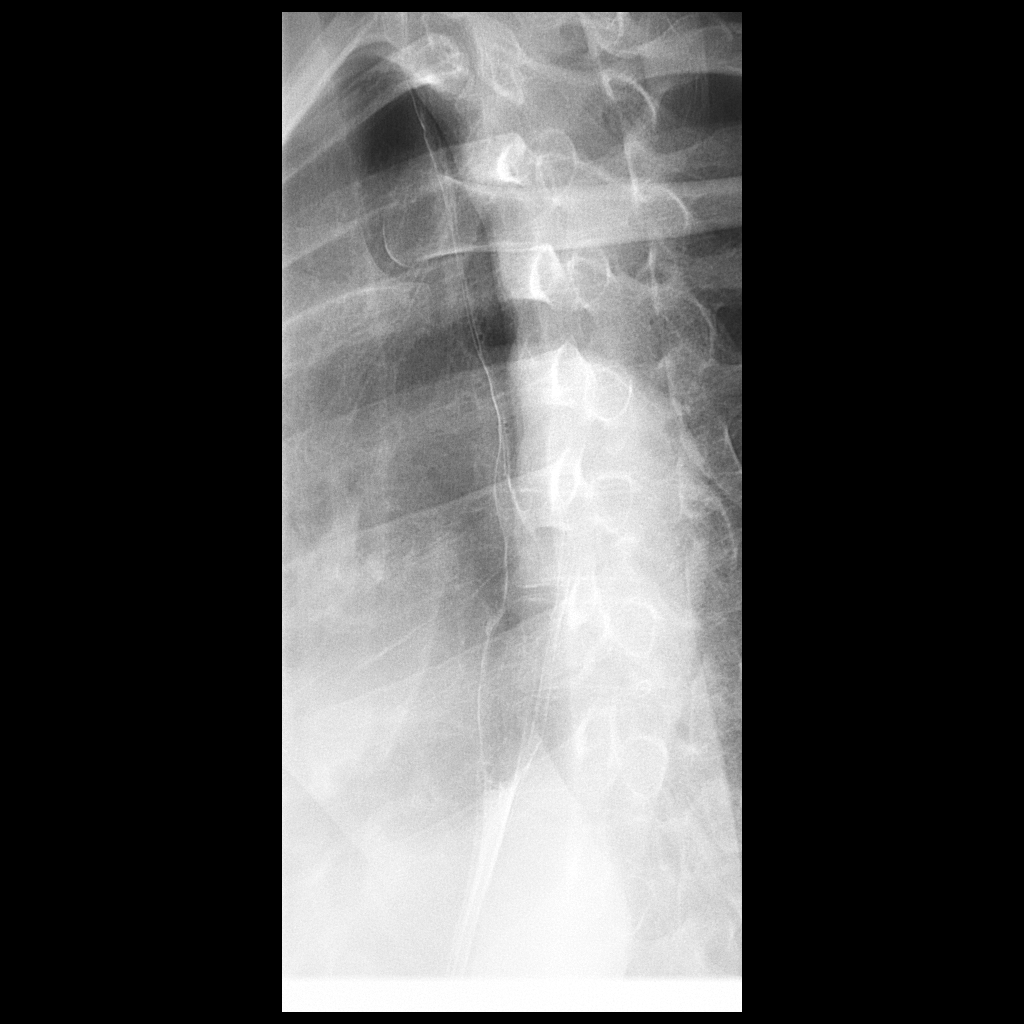

[Series 18: cp_standard · 0.36mm/px · 1 of 3 frames shown (10 of 14)]
[frame 1/3]
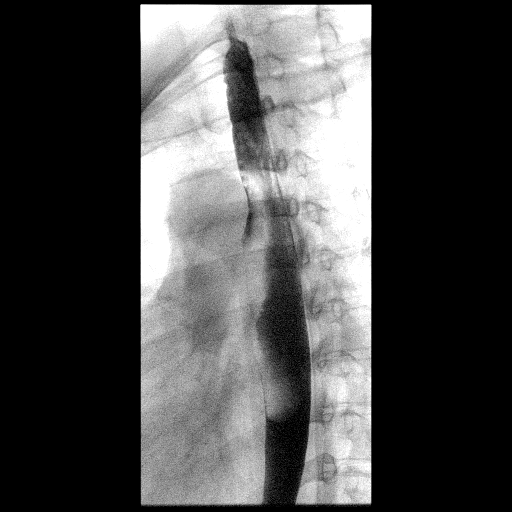

[Series 19: cp_standard · 0.36mm/px · 1 of 7 frames shown (11 of 14)]
[frame 7/7]
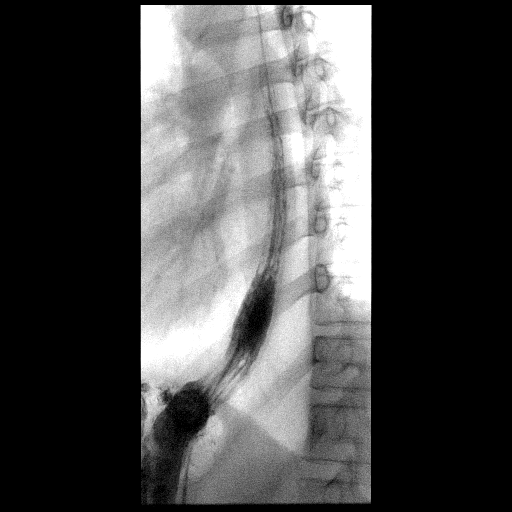

[Series 21: cp_standard · 0.37mm/px · 1 of 6 frames shown (12 of 14)]
[frame 6/6]
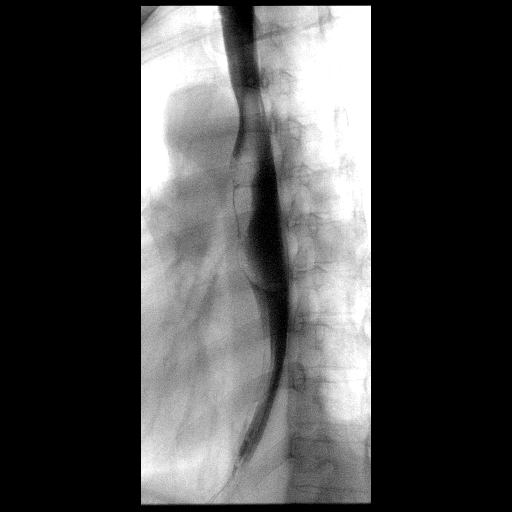

[Series 22: cp_standard · 0.38mm/px · 1 of 6 frames shown (13 of 14)]
[frame 6/6]
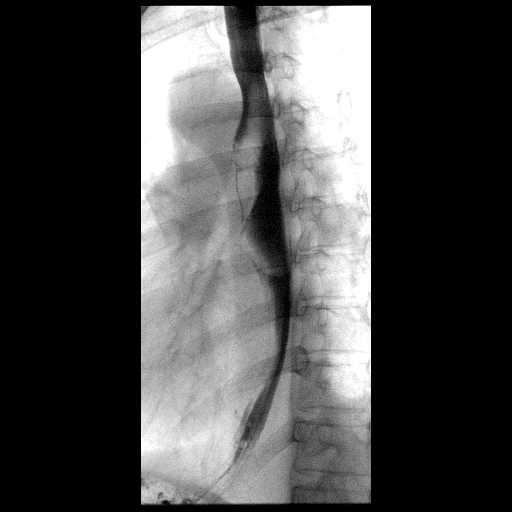

[Series 24: cp_standard · 0.38mm/px · 1 of 15 frames shown (14 of 14)]
[frame 15/15]
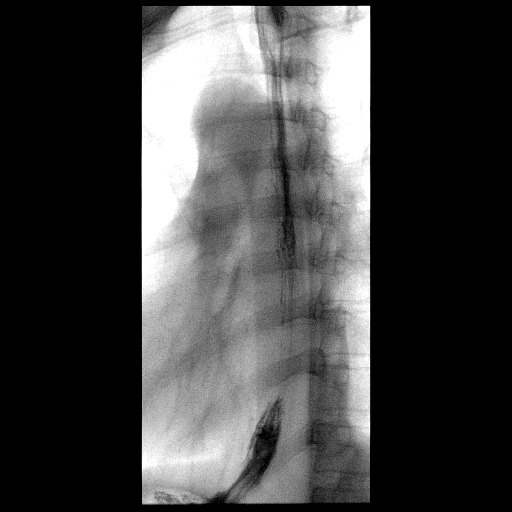

[15 of 24 positions shown; findings below may reference images not displayed]

FINDINGS: Lateral view of the oropharynx and upper esophagus shows
postoperative changes about the mandible.

Swallow in lateral projection again demonstrates an approximately 6
mm focal area of narrowing, approximately 6 mm length an
approximately 5 mm with in the area of the upper esophageal
sphincter/proximal esophagus, likely just below the upper esophageal
sphincter.

This is seen at every projection.

Distal esophagus showing normal distensibility

Mildly irregular fold thickening suggested distally. Signs of feline
esophagus when in upright position.

Prone RAO positioning then performed with single swallow assessment
showing limited primary wave with extensive stasis in the esophagus
clearing after subsequent swallow without barium. Full column
assessment with normal distensibility of the distal esophagus.
Question of irregular fold thickening however in the proximal
stomach.

Mild reflux to the mid esophagus with significant stasis in the
esophagus.
IMPRESSION: 1. Stricture or lesion in the proximal esophagus as described.
Endoscopic assessment is suggested for further evaluation. Pill
became lodged in this area during the swallow study and maximal
caliber during the esophageal evaluation as 5-6 mm in the RAO
position.
2. Signs of dysmotility with question of mildly irregular fold
thickening in both the distal esophagus and in the proximal stomach.
Areas not well assessed on single contrast evaluation. Attention to
these areas on subsequent evaluation is suggested.
3. Stasis in the esophagus and gastroesophageal reflux as described.

These results will be called to the ordering clinician or
representative by the Radiologist Assistant, and communication
documented in the PACS or [REDACTED].

## 2021-05-01 LAB — ANA COMPREHENSIVE PANEL
Anti JO-1: <0.2 AI (ref 0.0–0.9)
Centromere Ab Screen: <0.2 AI (ref 0.0–0.9)
Chromatin Ab SerPl-aCnc: <0.2 AI (ref 0.0–0.9)
ENA RNP Ab: <0.2 AI (ref 0.0–0.9)
ENA SM Ab Ser-aCnc: <0.2 AI (ref 0.0–0.9)
ENA SSA (RO) Ab: <0.2 AI (ref 0.0–0.9)
ENA SSB (LA) Ab: <0.2 AI (ref 0.0–0.9)
Scleroderma (Scl-70) (ENA) Antibody, IgG: <0.2 AI (ref 0.0–0.9)
dsDNA Ab: <1 IU/mL (ref 0–9)

## 2021-05-01 LAB — MULTIPLE MYELOMA PANEL, SERUM
Albumin SerPl Elph-Mcnc: 3.4 g/dL (ref 2.9–4.4)
Albumin/Glob SerPl: 1.4 (ref 0.7–1.7)
Alpha 1: 0.3 g/dL (ref 0.0–0.4)
Alpha2 Glob SerPl Elph-Mcnc: 0.6 g/dL (ref 0.4–1.0)
B-Globulin SerPl Elph-Mcnc: 0.8 g/dL (ref 0.7–1.3)
Gamma Glob SerPl Elph-Mcnc: 0.8 g/dL (ref 0.4–1.8)
Globulin, Total: 2.5 g/dL (ref 2.2–3.9)
IgA/Immunoglobulin A, Serum: 41 mg/dL — ABNORMAL LOW (ref 87–352)
IgG (Immunoglobin G), Serum: 819 mg/dL (ref 586–1602)
IgM (Immunoglobulin M), Srm: 66 mg/dL (ref 26–217)
Total Protein: 5.9 g/dL — ABNORMAL LOW (ref 6.0–8.5)

## 2021-05-01 LAB — HEAVY METALS, BLOOD
Arsenic: 3 ug/L (ref 0–9)
Lead, Blood: 4.5 ug/dL — ABNORMAL HIGH (ref 0.0–3.4)
Mercury: <1 ug/L (ref 0.0–14.9)

## 2021-05-01 LAB — HEMOGLOBIN A1C
Est. average glucose Bld gHb Est-mCnc: 100 mg/dL
Hgb A1c MFr Bld: 5.1 % (ref 4.8–5.6)

## 2021-05-01 LAB — TSH: TSH: 0.511 u[IU]/mL (ref 0.450–4.500)

## 2021-05-01 LAB — VITAMIN B6: Vitamin B6: 2.8 ug/L — ABNORMAL LOW (ref 3.4–65.2)

## 2021-05-01 LAB — ANGIOTENSIN CONVERTING ENZYME: Angio Convert Enzyme: 31 U/L (ref 14–82)

## 2021-05-01 LAB — HIV ANTIBODY (ROUTINE TESTING W REFLEX): HIV Screen 4th Generation wRfx: NONREACTIVE

## 2021-05-01 LAB — VITAMIN B1: Thiamine: 104.7 nmol/L (ref 66.5–200.0)

## 2021-05-01 LAB — RHEUMATOID FACTOR: Rheumatoid fact SerPl-aCnc: <10 IU/mL (ref <14.0)

## 2021-05-01 LAB — SEDIMENTATION RATE: Sed Rate: 2 mm/hr (ref 0–40)

## 2021-05-01 LAB — METHYLMALONIC ACID, SERUM: Methylmalonic Acid: 201 nmol/L (ref 0–378)

## 2021-05-01 LAB — ETHANOL

## 2021-05-01 LAB — RPR: RPR Ser Ql: NONREACTIVE

## 2021-05-01 LAB — HEPATITIS C ANTIBODY: Hep C Virus Ab: NONREACTIVE

## 2021-05-05 DIAGNOSIS — R200 Anesthesia of skin: Secondary | ICD-10-CM | POA: Insufficient documentation

## 2021-05-05 DIAGNOSIS — R202 Paresthesia of skin: Secondary | ICD-10-CM | POA: Insufficient documentation

## 2021-05-07 ENCOUNTER — Telehealth: Payer: Self-pay | Admitting: Neurology

## 2021-05-07 NOTE — Telephone Encounter (Signed)
NCV/EMG sent to [facility] [phone]. ?

## 2021-05-08 ENCOUNTER — Other Ambulatory Visit: Payer: Self-pay | Admitting: Neurology

## 2021-05-08 DIAGNOSIS — E531 Pyridoxine deficiency: Secondary | ICD-10-CM | POA: Insufficient documentation

## 2021-05-08 DIAGNOSIS — R7871 Abnormal lead level in blood: Secondary | ICD-10-CM | POA: Insufficient documentation

## 2021-05-08 NOTE — Progress Notes (Signed)
(  Dr. Tilford Pillar) Stephanie Padilla, your lead is slightly elevated but I do not think this is concerning, it is just minimally elevated. I would recheck it though in a few months just to make sure. I will reorder the test and you can come back at your convenience in a few months. 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 I would get a B6 supplement over the counter and take that daily, hopefully this may help and the B6 should be rechecked in a few months as well I will also order that test and you can come back at your convenience.

## 2021-05-13 ENCOUNTER — Telehealth: Payer: Self-pay

## 2021-05-13 NOTE — Telephone Encounter (Signed)
I called patient. I discussed her lab results and recommendations. Patient is agreeable to trying a vitamin B6 supplement OTC. She will return to our office in a few months to recheck vitamin B6 and lead levels. I gave her our office hours and asked her to avoid 12-1pm. Pt verbalized understanding of results. Pt had no questions at this time but was encouraged to call back if questions arise. ? ?

## 2021-05-13 NOTE — Telephone Encounter (Signed)
-----   Message from Melvenia Beam, MD sent at 05/13/2021  1:06 PM EDT ----- ?Patient has not reviewed her mychart message, can you call please and review: Stephanie Padilla, your lead is slightly elevated but I do not think this is concerning, it is just minimally elevated. I would recheck it though in a few months just to make sure. I will reorder the test and you can come back at your convenience in a few months. 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 I would get a B6 supplement over the counter and take that daily, hopefully this may help and  ... ?

## 2021-06-02 ENCOUNTER — Other Ambulatory Visit: Payer: Self-pay | Admitting: Student

## 2021-06-02 DIAGNOSIS — G6289 Other specified polyneuropathies: Secondary | ICD-10-CM

## 2021-09-05 ENCOUNTER — Other Ambulatory Visit: Payer: Self-pay | Admitting: Student

## 2021-09-05 DIAGNOSIS — Z1231 Encounter for screening mammogram for malignant neoplasm of breast: Secondary | ICD-10-CM

## 2021-09-06 NOTE — Progress Notes (Unsigned)
CC: Follow-up on medications  HPI:   Stephanie Padilla is a 56 y.o. female with a past medical history of depression, hypertension, and GERD who presents for medication adjustments. She was last seen at [facility] in 12-2020.    Past Medical History:  Diagnosis Date   ALLERGIC RHINITIS 10/10/2009   Qualifier: Diagnosis of  By: Guy Sandifer DO, Shelly     CANNABIS ABUSE 04/17/2008   Qualifier: Diagnosis of  By: Redmond Pulling  MD, Valerie     Chronic abdominal pain    Chronic Abd distension / bloating. W/U includes CT ABD & pelvis 7/08 negative. EGD 5/09 Dr Penelope Coop :erythematous gastric mucosa and bx c/w chronic gastritis - was neg for H pylori. Duo polyp had path c/w peptic duodenitis   Depression    DEPRESSION 08/05/2006   Qualifier: Diagnosis of  By: Redmond Pulling  MD, Valerie     Depression    GERD 08/05/2006   Qualifier: Diagnosis of  By: Redmond Pulling  MD, Luanne Bras, unspecified 10/16/2009   Qualifier: Diagnosis of  By: Guy Sandifer DO, Shelly     Hemorrhoids 01/18/2012   Hernia of abdominal cavity    INSOMNIA 10/15/2008   Qualifier: Diagnosis of  By: Redmond Pulling  MD, Valerie     Irritable bowel syndrome 08/06/2009   Qualifier: Diagnosis of  By: Criss Alvine     NICOTINE ADDICTION 07/06/2008   Started on Chantix.     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   Radiculopathy of arm 11/26/2010   Spontaneous abortion    2     Review of Systems:    Reports finger and toe sensitivity-numbness, loss of appetite, nausea, occasional vomiting Denies dry mouth, diarrhea, constipation, bloody stools    Physical Exam:  Vitals:   09/08/21 1430  BP: 97/71  Pulse: 70  Temp: 98.4 F (36.9 C)  TempSrc: Oral  SpO2: 100%  Weight: 107 lb 8 oz (48.8 kg)    General:   awake and alert, sitting comfortably in chair, cooperative, not in acute distress Skin:   warm and dry, intact without any obvious lesions or scars, no rashes or lesions  Lungs:   normal respiratory effort, breathing unlabored, symmetrical  chest rise, no crackles or wheezing Cardiac:   regular rate and rhythm, normal S1 and S2 Psychiatric:   mood and affect normal, intelligible speech    Assessment & Plan:   GERD Patient reports experiencing burning in epigastric region that sometimes radiates down toward her abdomen after meals, even small snacks. She sometimes vomits due to the pain and associated nausea. In the past, she had taken a PPI for these symptoms, which seemed to help. 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 Collectively, these symptoms and findings are consistent with mild GERD. Given her low BMI of 18.5, loss of appetite, gastrointestinal symptoms, and low vitamin B6 celiac disease is a possibility.  -Prescribe pantoprazole for GERD-related symptoms -Order tissue transglutaminase IgA to rule out Celiac disease   Peripheral neuropathy Patient reports almost one year of an achy discomfort in her bilateral fingers and toes. This discomfort is associated with a sensation of coldness and numbness. During these episodes, she does not notice any discoloration of her hands. She was previously prescribed gabapentin, which helped slightly, but after increasing her dose, she noticed a significant decrease in appetite. She had lost about 15lbs since 11-2020 and regained most of this weight since stopping the gabapentin.  Her extremity discomfort has persisted and she is now interested in pharmacologic alternatives to gabapentin. She recently saw neurology and had an EMG of her hands, which was negative. Etiology remains unclear at this time.  -Prescribe pregabalin '75mg'$  q8   Hypokalemia Patient has long history of low potassium levels dating back to 2012. After her last [facility] visit in 12-2020, her potassium was 2.7. She was subsequently started on potassium supplement and her level increased to 3.6. At that time, she was  encouraged to increase her dietary intake of potassium and she is no longer on supplementation.  -Recheck potassium with CMP and resume supplementation if below goal of 3.5     See Encounters Tab for problem based charting.  Patient seen with Dr. Philipp Ovens

## 2021-09-08 ENCOUNTER — Ambulatory Visit (INDEPENDENT_AMBULATORY_CARE_PROVIDER_SITE_OTHER): Payer: Medicaid Other | Admitting: Student

## 2021-09-08 VITALS — BP 97/71 | HR 70 | Temp 98.4°F | Wt 107.5 lb

## 2021-09-08 DIAGNOSIS — K219 Gastro-esophageal reflux disease without esophagitis: Secondary | ICD-10-CM | POA: Diagnosis not present

## 2021-09-08 DIAGNOSIS — G629 Polyneuropathy, unspecified: Secondary | ICD-10-CM | POA: Diagnosis not present

## 2021-09-08 DIAGNOSIS — E876 Hypokalemia: Secondary | ICD-10-CM

## 2021-09-08 DIAGNOSIS — E785 Hyperlipidemia, unspecified: Secondary | ICD-10-CM

## 2021-09-08 DIAGNOSIS — R634 Abnormal weight loss: Secondary | ICD-10-CM

## 2021-09-08 MED ORDER — PREGABALIN 75 MG PO CAPS: 75.0000 mg | ORAL_CAPSULE | Freq: Three times a day (TID) | ORAL | 2 refills | Status: DC

## 2021-09-08 MED ORDER — PANTOPRAZOLE SODIUM 20 MG PO TBEC: 20.0000 mg | DELAYED_RELEASE_TABLET | Freq: Every day | ORAL | 2 refills | Status: DC

## 2021-09-08 NOTE — Assessment & Plan Note (Signed)
Patient reports almost one year of an achy discomfort in her bilateral fingers and toes. This discomfort is associated with a sensation of coldness and numbness. During these episodes, she does not notice any discoloration of her hands. She was previously prescribed gabapentin, which helped slightly, but after increasing her dose, she noticed a significant decrease in appetite. She had lost about 15lbs since 11-2020 and regained most of this weight since stopping the gabapentin. Her extremity discomfort has persisted and she is now interested in pharmacologic alternatives to gabapentin. She recently saw neurology and had an EMG of her hands, which was negative. Etiology remains unclear at this time.  -Prescribe pregabalin '75mg'$  q8

## 2021-09-08 NOTE — Assessment & Plan Note (Signed)
Patient reports experiencing burning in epigastric region that sometimes radiates down toward her abdomen after meals, even small snacks. She sometimes vomits due to the pain and associated nausea. In the past, she had taken a PPI for these symptoms, which seemed to help. 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 Collectively, these symptoms and findings are consistent with mild GERD. Given her low BMI of 18.5, loss of appetite, gastrointestinal symptoms, and low vitamin B6 celiac disease is a possibility.  -Prescribe pantoprazole for GERD-related symptoms -Order tissue transglutaminase IgA to rule out Celiac disease

## 2021-09-08 NOTE — Patient Instructions (Signed)
  Thank you, [name], for allowing [place] to provide your care today. Today we discussed . . .  > Finger Sensitivity       - we have prescribed you pregabalin instead of gabapentin, which may help the pain without loss of appetite > Low Potassium       - we have ordered some labs to check your potassium levels > Gastric Reflux       - we have prescribed you pantoprazole '20mg'$  daily for your heart burn   I have ordered the following labs for you:  Lab Orders         Tissue Transglutaminase Abs,IgG,IgA         CMP14 + Anion Gap       Tests ordered today:  None   Referrals ordered today:   Referral Orders  No referral(s) requested today      I have ordered the following medication/changed the following medications:   Stop the following medications: Medications Discontinued During This Encounter  Medication Reason   gabapentin (NEURONTIN) 300 MG capsule      Start the following medications: Meds ordered this encounter  Medications   pregabalin (LYRICA) 75 MG capsule    Sig: Take 1 capsule (75 mg total) by mouth 3 (three) times daily.    Dispense:  90 capsule    Refill:  2   pantoprazole (PROTONIX) 20 MG tablet    Sig: Take 1 tablet (20 mg total) by mouth daily.    Dispense:  30 tablet    Refill:  2      Follow up: 3 months    Remember:  Try the pregabalin and let us know if this helps your finger and toe sensitivity. The pantoprazole should help with your acid reflux. We will see you in clinic in about three months!   Should you have any questions or concerns please call the internal medicine clinic at [phone].     Roswell Nickel, MD [place]

## 2021-09-08 NOTE — Assessment & Plan Note (Signed)
Patient has long history of low potassium levels dating back to 2012. After her last [facility] visit in 12-2020, her potassium was 2.7. She was subsequently started on potassium supplement and her level increased to 3.6. At that time, she was encouraged to increase her dietary intake of potassium and she is no longer on supplementation.  -Recheck potassium with CMP and resume supplementation if below goal of 3.5

## 2021-09-09 LAB — TISSUE TRANSGLUTAMINASE ABS,IGG,IGA
Tissue Transglut Ab: <2 U/mL (ref 0–5)
Transglutaminase IgA: <2 U/mL (ref 0–3)

## 2021-09-09 LAB — CMP14 + ANION GAP
ALT: 9 IU/L (ref 0–32)
AST: 17 IU/L (ref 0–40)
Albumin/Globulin Ratio: 2.3 — ABNORMAL HIGH (ref 1.2–2.2)
Albumin: 4.3 g/dL (ref 3.8–4.9)
Alkaline Phosphatase: 73 IU/L (ref 44–121)
Anion Gap: 18 mmol/L (ref 10.0–18.0)
BUN/Creatinine Ratio: 7 — ABNORMAL LOW (ref 9–23)
BUN: 4 mg/dL — ABNORMAL LOW (ref 6–24)
Bilirubin Total: 0.3 mg/dL (ref 0.0–1.2)
CO2: 21 mmol/L (ref 20–29)
Calcium: 9.3 mg/dL (ref 8.7–10.2)
Chloride: 102 mmol/L (ref 96–106)
Creatinine, Ser: 0.55 mg/dL — ABNORMAL LOW (ref 0.57–1.00)
Globulin, Total: 1.9 g/dL (ref 1.5–4.5)
Glucose: 98 mg/dL (ref 70–99)
Potassium: 3.7 mmol/L (ref 3.5–5.2)
Sodium: 141 mmol/L (ref 134–144)
Total Protein: 6.2 g/dL (ref 6.0–8.5)
eGFR: 110 mL/min/{1.73_m2} (ref >59)

## 2021-09-09 NOTE — Progress Notes (Signed)
Internal Medicine Clinic Attending  I saw and evaluated the patient.  I personally confirmed the key portions of the history and exam documented by Dr. Harper and I reviewed pertinent patient test results.  The assessment, diagnosis, and plan were formulated together and I agree with the documentation in the resident's note.  

## 2021-09-12 ENCOUNTER — Encounter: Payer: Self-pay | Admitting: Student

## 2021-09-29 ENCOUNTER — Ambulatory Visit
Admission: RE | Admit: 2021-09-29 | Discharge: 2021-09-29 | Disposition: A | Discharge status: Home / Self Care | Payer: Medicaid Other | Source: Ambulatory Visit | Attending: Hospitalist | Admitting: Hospitalist

## 2021-09-29 DIAGNOSIS — Z1231 Encounter for screening mammogram for malignant neoplasm of breast: Secondary | ICD-10-CM

## 2022-04-18 IMAGING — CT CT HEAD W/O CM
5 of 6 series · 17 of 47 positions shown, 18 images · non-contrast
Comparison: 02/08/2020, 01/15/2011

CLINICAL DATA: Altered level of consciousness



[Series 3: head wo · axial · 0.43mm/px · z∈[-174,-114]mm · 2 of 36 slices shown, 3 images]
[im 12/36  brain]
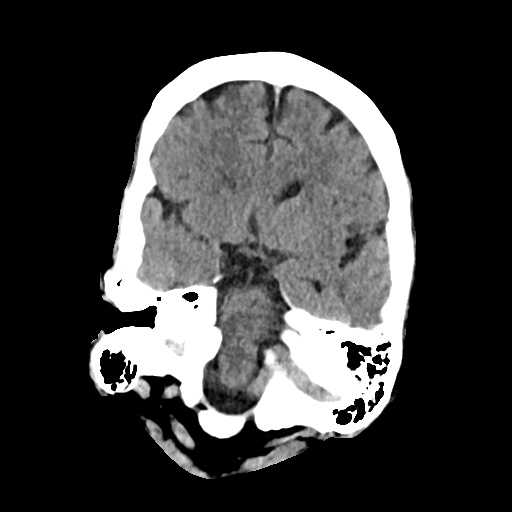
[im 12/36  bone]
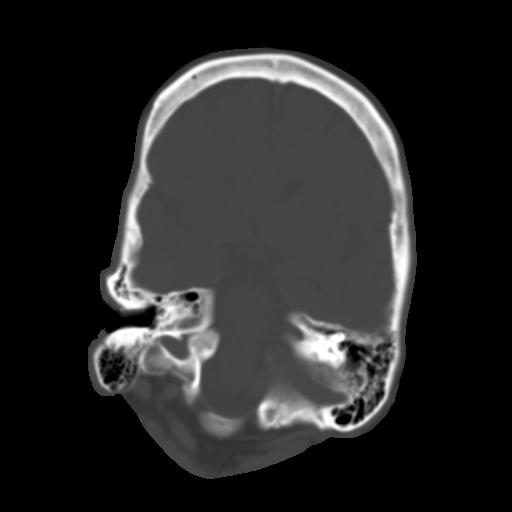
[im 24/36  brain]
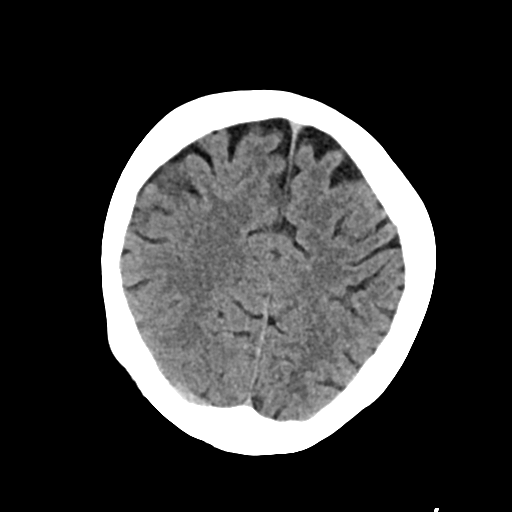

[Series 4: head bone · axial · 0.43mm/px · z∈[-213,-89]mm · 7 of 89 slices shown]
[im 9/89  bone]
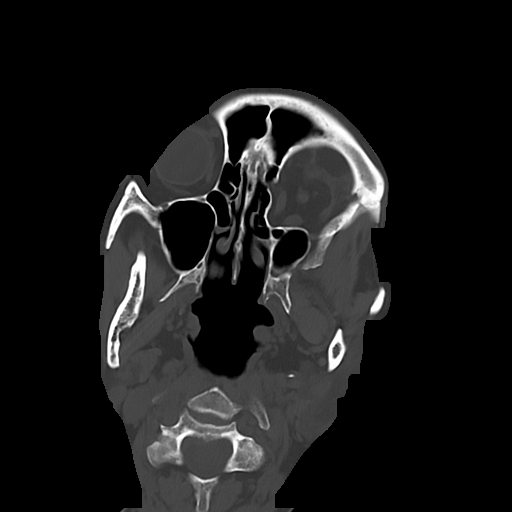
[im 18/89  bone]
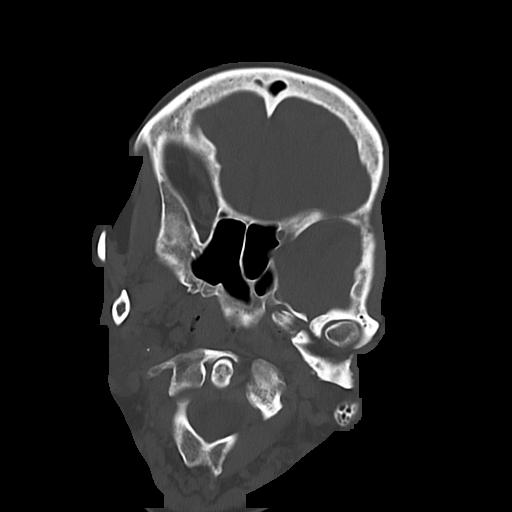
[im 27/89  bone]
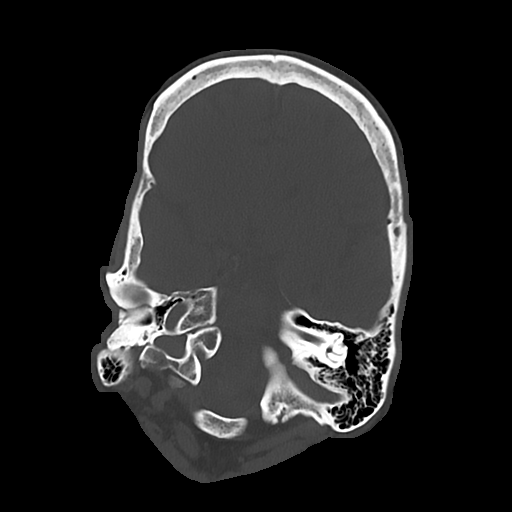
[im 36/89  bone]
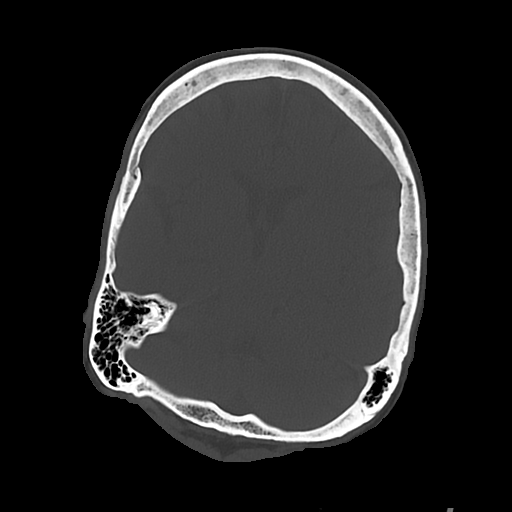
[im 53/89  bone]
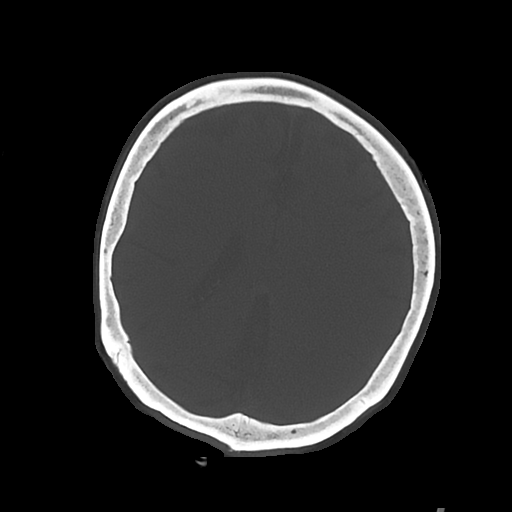
[im 62/89  bone]
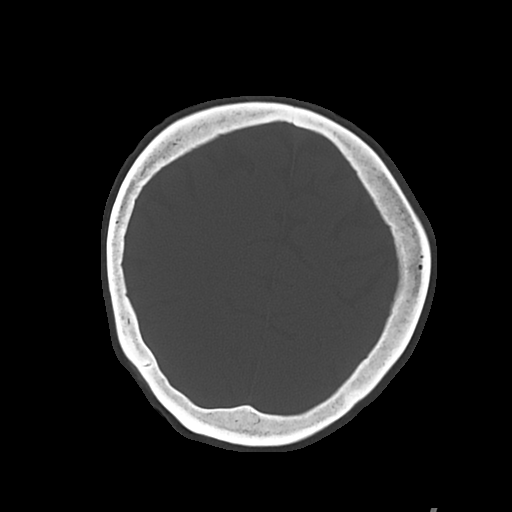
[im 71/89  bone]
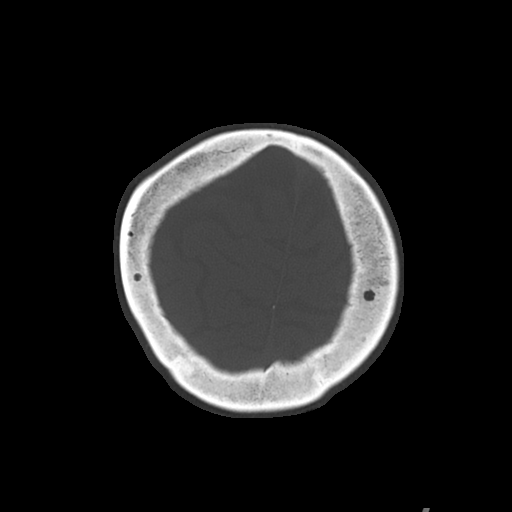

[Series 5: cor soft · coronal · 0.33mm/px · 3 of 66 slices shown]
[im 22/66  brain]
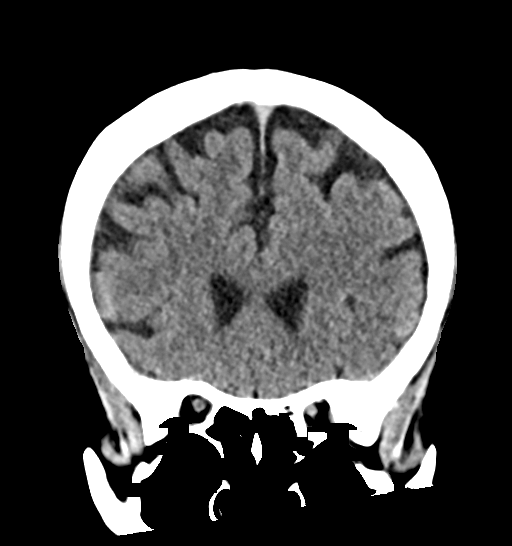
[im 29/66  brain]
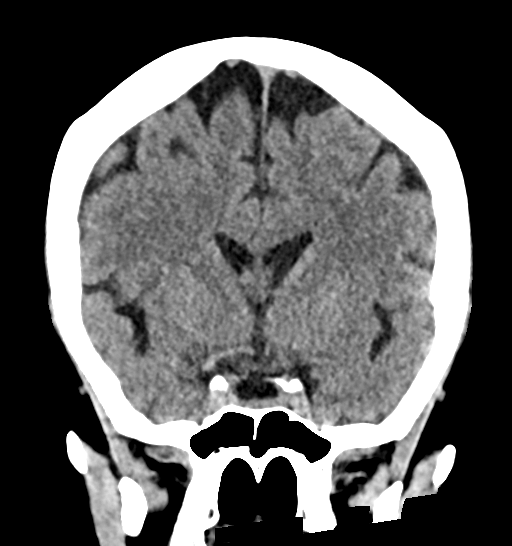
[im 37/66  brain]
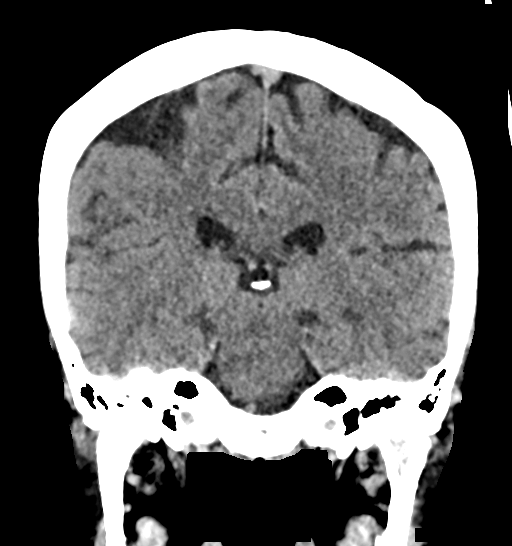

[Series 6: sag soft · sagittal · 0.35mm/px · 3 of 53 slices shown]
[im 19/53  brain]
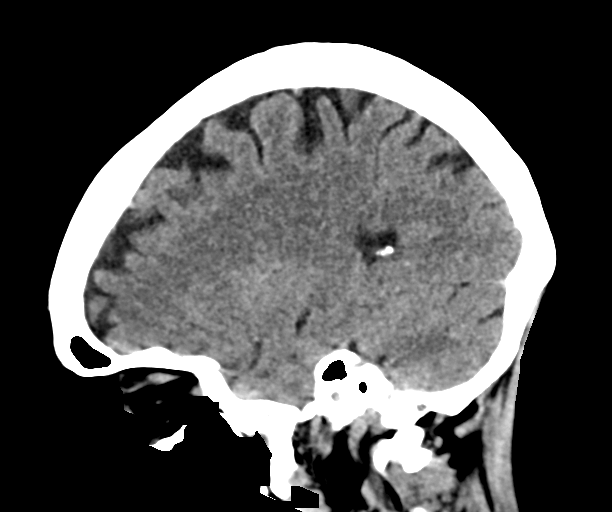
[im 27/53  brain]
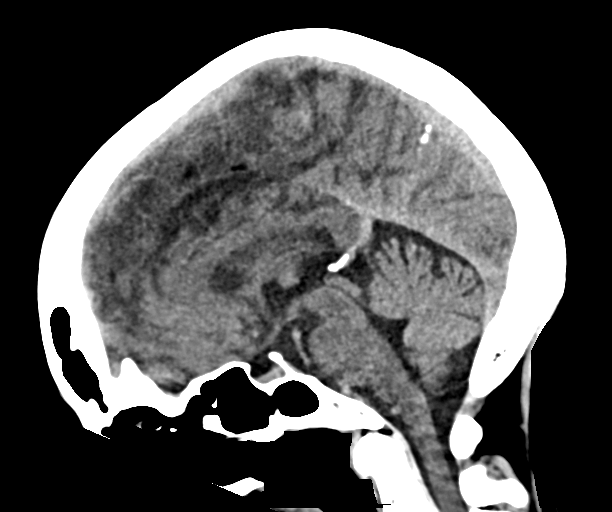
[im 34/53  brain]
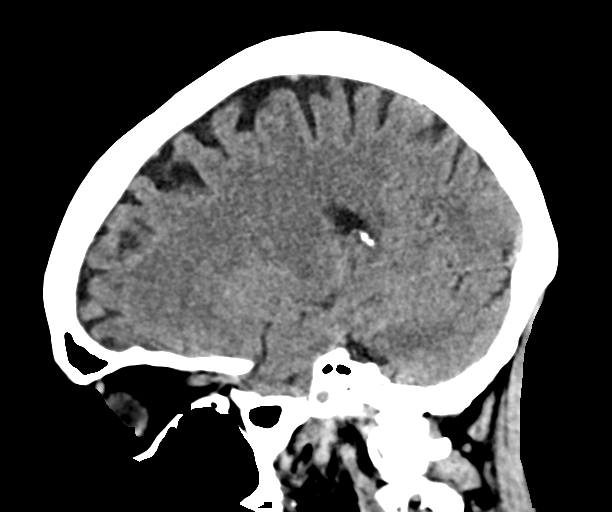

[Series 8: head wo true ax · axial · 0.33mm/px · z∈[-198,-148]mm · 2 of 34 slices shown]
[im 12/34  brain]
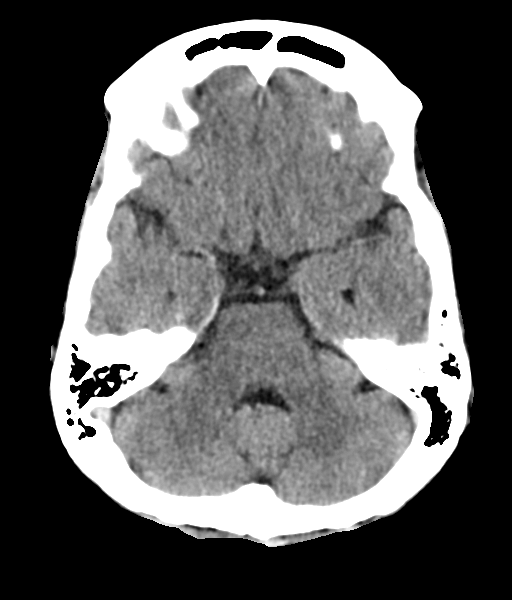
[im 23/34  brain]
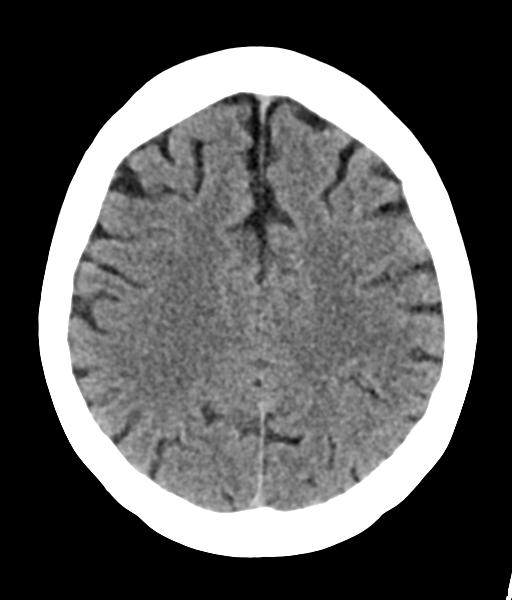

[17 of 47 positions shown; findings below may reference images not displayed]

FINDINGS: Brain: No acute infarct or hemorrhage. Lateral ventricles and
midline structures are unremarkable. No acute extra-axial fluid
collections. No mass effect.

Vascular: No hyperdense vessel or unexpected calcification.

Skull: Normal. Negative for fracture or focal lesion.

Sinuses/Orbits: Postsurgical changes left orbit. Left ocular
prosthesis. Paranasal sinuses are unremarkable.

Other: None.
IMPRESSION: 1. No acute intracranial process.

## 2022-06-01 ENCOUNTER — Other Ambulatory Visit: Payer: Self-pay

## 2022-06-01 ENCOUNTER — Encounter: Payer: Self-pay | Admitting: Student

## 2022-06-01 ENCOUNTER — Ambulatory Visit: Payer: Medicaid Other | Admitting: Student

## 2022-06-01 VITALS — BP 114/75 | HR 75 | Temp 98.2°F | Ht 64.0 in | Wt 119.0 lb

## 2022-06-01 DIAGNOSIS — G629 Polyneuropathy, unspecified: Secondary | ICD-10-CM | POA: Diagnosis not present

## 2022-06-01 DIAGNOSIS — R634 Abnormal weight loss: Secondary | ICD-10-CM | POA: Diagnosis not present

## 2022-06-01 DIAGNOSIS — Z23 Encounter for immunization: Secondary | ICD-10-CM | POA: Diagnosis not present

## 2022-06-01 DIAGNOSIS — E876 Hypokalemia: Secondary | ICD-10-CM

## 2022-06-01 DIAGNOSIS — G609 Hereditary and idiopathic neuropathy, unspecified: Secondary | ICD-10-CM

## 2022-06-01 DIAGNOSIS — E559 Vitamin D deficiency, unspecified: Secondary | ICD-10-CM

## 2022-06-01 DIAGNOSIS — Z0000 Encounter for general adult medical examination without abnormal findings: Secondary | ICD-10-CM

## 2022-06-01 LAB — GLUCOSE, CAPILLARY: Glucose-Capillary: 111 mg/dL — ABNORMAL HIGH (ref 70–99)

## 2022-06-01 LAB — POCT GLYCOSYLATED HEMOGLOBIN (HGB A1C): Hemoglobin A1C: 5.3 % (ref 4.0–5.6)

## 2022-06-01 MED ORDER — PREGABALIN 100 MG PO CAPS
100.0000 mg | ORAL_CAPSULE | Freq: Three times a day (TID) | ORAL | 2 refills | Status: DC | PRN
Start: 2022-06-01 — End: 2022-09-14

## 2022-06-01 NOTE — Progress Notes (Signed)
Internal Medicine Clinic Attending  Case discussed with Dr. Nguyen  At the time of the visit.  We reviewed the resident's history and exam and pertinent patient test results.  I agree with the assessment, diagnosis, and plan of care documented in the resident's note. 

## 2022-06-01 NOTE — Assessment & Plan Note (Addendum)
Patient has chronic distal polyneuropathy that was present since 2022.  She saw a neurologist in April 2023 who performed an extensive workup that was unremarkable except for low B6 and mildly elevated lead level.  She also had an EMG performed in June which I could not see the result.  Her neuropathy was thought due to vitamin B6 deficiency.   Patient endorses persistent numbness and tingling sensation of bilateral fingertips without palmar and dorsal involvement.  She denies any change in her strength of bilateral upper extremities.  No triggering factors.  Pregabalin helps reduce her symptoms without sedating side effects.  She denies any cervical radiculopathy symptoms, joint pains or trauma.  She also reports similar sensation of bilateral toes and the arches of her feet.  She has intermittent unbalanced sensation without any falls.  She does not have any sciatica symptoms.  She has no history of cancer or radiation.  She states that her symptoms are actually getting worse in the last few months.  Physical exam showed diminished sensation of bilateral symmetrical fingertips.  Tapping on the dorsal wrists explicit tingling sensation of 2nd-4th fingertips without thumb involvement.  She has intact strength and range of motion of bilateral upper extremities.  Normal bilateral bicep deep tendon reflex. Spurling test is negative bilaterally.  She also has diminished sensation of symmetrical bilateral toes and the arches at the palmar side.  No dorsal side involvement.  Intact patellar deep tendon reflex bilaterally with intact strength and range of motion of bilateral LE.  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  I am suspecting small fibers involvement.  Patient is not diabetic or has history of radiation.  No obvious medications that can cause this.  She has no history of liver disease or COPD.  HIV, thyroid disease and celiac disease were ruled out.  She was seen in  orthopedic who ruled out carpal tunnel or cubital tunnel syndrome.  Her B6 deficiency could be a contributing factor from her malnutrition.  Patient has mildly elevated lead level which was thought to be not significant per neurology.  -Repeat B6, B12, vitamin D and lead level -Obtain CBC to rule out polycythemia vera -Obtain CMP to look for kidney function, liver function, calcium and electrolytes -Referral to neurology for follow-up -Increase pregabalin to 100 mg every 8 hours as needed.  Counseled patient on sedation side effects

## 2022-06-01 NOTE — Assessment & Plan Note (Signed)
Her weight loss was thought due to decreased appetite from esophageal stricture.  Fortunately her weight is trending upwards steadily at 119 pounds today.  She reports eating more and denies any dysphagia.  -Encourage p.o. intake -Will keep a close monitoring on her weight at every visit.

## 2022-06-01 NOTE — Assessment & Plan Note (Signed)
Tdap vaccine given today.  

## 2022-06-01 NOTE — Patient Instructions (Addendum)
Ms. Goodling,  It was nice seeing you in the clinic today.  Here is a summary what we talked about:  1.  I will check some blood work for your neuropathy.  Please follow-up with the neurologist for further workup.  I also increased your pregabalin to help with the symptoms.  This medication can be sedating so be mindful when you take it.  2.  I am glad your weight is going up.  Please keep up with oral intake.  3.  We gave you a Tdap vaccine today.  Please return in 3 months  Dr. Cyndie Chime

## 2022-06-01 NOTE — Progress Notes (Signed)
CC: Peripheral neuropathy follow-up  HPI:  Stephanie Padilla is a 57 y.o. living with depression, memory dysfunction, malnutrition, who presents to the clinic for peripheral neuropathy evaluation.  Please see problem based charting for detail  Past Medical History:  Diagnosis Date   ALLERGIC RHINITIS 10/10/2009   Qualifier: Diagnosis of  By: Saralyn Pilar DO, Shelly     CANNABIS ABUSE 04/17/2008   Qualifier: Diagnosis of  By: Andrey Campanile  MD, Valerie     Chronic abdominal pain    Chronic Abd distension / bloating. W/U includes CT ABD & pelvis 7/08 negative. EGD 5/09 Dr Evette Cristal :erythematous gastric mucosa and bx c/w chronic gastritis - was neg for H pylori. Duo polyp had path c/w peptic duodenitis   Depression    DEPRESSION 08/05/2006   Qualifier: Diagnosis of  By: Andrey Campanile  MD, Valerie     Depression    GERD 08/05/2006   Qualifier: Diagnosis of  By: Andrey Campanile  MD, Mickeal Skinner, unspecified 10/16/2009   Qualifier: Diagnosis of  By: Saralyn Pilar DO, Shelly     Hemorrhoids 01/18/2012   Hernia of abdominal cavity    INSOMNIA 10/15/2008   Qualifier: Diagnosis of  By: Andrey Campanile  MD, Valerie     Irritable bowel syndrome 08/06/2009   Qualifier: Diagnosis of  By: Lyn Hollingshead     NICOTINE ADDICTION 07/06/2008   Started on Chantix.     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    Appearance: She is not ill-appearing.  Eyes:     Conjunctiva/sclera: Conjunctivae normal.  Cardiovascular:     Rate and Rhythm: Normal rate and regular rhythm.  Pulmonary:     Effort: Pulmonary effort is normal. No respiratory distress.     Breath sounds: Normal breath sounds.  No wheezing.  Musculoskeletal:        General: Normal range of motion.     Cervical back: Normal range of motion.  Skin:    General: Skin is warm.  Neurological:     Mental Status: She is alert.     Comments: -Diminished sensation of bilateral symmetrical fingertips.  Tapping on the dorsal wrist elicits tingling of 2-4th fingertips.  No thumb involvement.  Intact strength and range of motion of bilateral upper extremities.  Intact biceps deep tendon reflex.  She has negative Spurling test bilaterally. -Diminished sensation of symmetrical bilateral toes and also midfoot at the palmar side.  No dorsal side involvement.  Intact patella deep tendon reflex.  Intact strength and range of motion of bilateral lower extremities.  Psychiatric:        Mood and Affect: Mood normal.      Assessment & Plan:   See Encounters Tab for problem based charting.  Idiopathic peripheral neuropathy Patient has chronic distal polyneuropathy that was present since 2022.  She saw a neurologist in April 2023 who performed an extensive workup that was unremarkable except for low B6 and mildly elevated lead level.  She also had an EMG performed in June which I could not see the result.  Her  neuropathy was thought due to vitamin B6 deficiency.   Patient endorses persistent numbness and tingling sensation of bilateral fingertips without palmar and dorsal involvement.  She denies any change in her strength of bilateral upper extremities.  No triggering factors.  Pregabalin helps reduce her symptoms without sedating side effects.  She denies any cervical radiculopathy symptoms, joint pains or trauma.  She also reports similar sensation of bilateral toes and the arches of her feet.  She has intermittent unbalanced sensation without any falls.  She does not have any sciatica symptoms.  She has no history of cancer or radiation.  She states that her symptoms are actually getting worse in the last few months.  Physical exam  showed diminished sensation of bilateral symmetrical fingertips.  Tapping on the dorsal wrists explicit tingling sensation of 2nd-4th fingertips without thumb involvement.  She has intact strength and range of motion of bilateral upper extremities.  Normal bilateral bicep deep tendon reflex. Spurling test is negative bilaterally.  She also has diminished sensation of symmetrical bilateral toes and the arches at the palmar side.  No dorsal side involvement.  Intact patellar deep tendon reflex bilaterally with intact strength and range of motion of bilateral LE.  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  I am suspecting small fibers involvement.  Patient is not diabetic or has history of radiation.  No obvious medications that can cause this.  She has no history of liver disease or COPD.  HIV, thyroid disease and celiac disease were ruled out.  She was seen in orthopedic who ruled out carpal tunnel or cubital tunnel syndrome.  Her B6 deficiency could be a contributing factor from her malnutrition.  Patient has mildly elevated lead level which was thought to be not significant per neurology.  -Repeat B6, B12, vitamin D and lead level -Obtain CBC to rule out polycythemia vera -Obtain CMP to look for kidney function, liver function, calcium and electrolytes -Referral to neurology for follow-up -Increase pregabalin to 100 mg every 8 hours as needed.  Counseled patient on sedation side effects   Weight loss Her weight loss was thought due to decreased appetite from esophageal stricture.  Fortunately her weight is trending upwards steadily at 119 pounds today.  She reports eating more and denies any dysphagia.  -Encourage p.o. intake -Will keep a close monitoring on her weight at every visit.  Healthcare maintenance Tdap vaccine given today   Patient discussed with Dr. Antony Contras

## 2022-06-02 LAB — CMP14 + ANION GAP
ALT: 14 IU/L (ref 0–32)
AST: 24 IU/L (ref 0–40)
Albumin/Globulin Ratio: 1.7 (ref 1.2–2.2)
Albumin: 4.1 g/dL (ref 3.8–4.9)
Alkaline Phosphatase: 88 IU/L (ref 44–121)
Anion Gap: 18 mmol/L (ref 10.0–18.0)
BUN/Creatinine Ratio: 6 — ABNORMAL LOW (ref 9–23)
BUN: 3 mg/dL — ABNORMAL LOW (ref 6–24)
Bilirubin Total: 0.2 mg/dL (ref 0.0–1.2)
CO2: 22 mmol/L (ref 20–29)
Calcium: 9.5 mg/dL (ref 8.7–10.2)
Chloride: 104 mmol/L (ref 96–106)
Creatinine, Ser: 0.52 mg/dL — ABNORMAL LOW (ref 0.57–1.00)
Globulin, Total: 2.4 g/dL (ref 1.5–4.5)
Glucose: 99 mg/dL (ref 70–99)
Potassium: 3.4 mmol/L — ABNORMAL LOW (ref 3.5–5.2)
Sodium: 144 mmol/L (ref 134–144)
Total Protein: 6.5 g/dL (ref 6.0–8.5)
eGFR: 110 mL/min/{1.73_m2} (ref >59)

## 2022-06-02 LAB — CBC
Hematocrit: 45.4 % (ref 34.0–46.6)
Hemoglobin: 15.1 g/dL (ref 11.1–15.9)
MCH: 32.5 pg (ref 26.6–33.0)
MCHC: 33.3 g/dL (ref 31.5–35.7)
MCV: 98 fL — ABNORMAL HIGH (ref 79–97)
Platelets: 273 10*3/uL (ref 150–450)
RBC: 4.64 x10E6/uL (ref 3.77–5.28)
RDW: 13.3 % (ref 11.7–15.4)
WBC: 7 10*3/uL (ref 3.4–10.8)

## 2022-06-02 LAB — VITAMIN B12: Vitamin B-12: 584 pg/mL (ref 232–1245)

## 2022-06-02 LAB — VITAMIN D 25 HYDROXY (VIT D DEFICIENCY, FRACTURES): Vit D, 25-Hydroxy: 9.5 ng/mL — ABNORMAL LOW (ref 30.0–100.0)

## 2022-06-02 MED ORDER — POTASSIUM CHLORIDE CRYS ER 20 MEQ PO TBCR
20.0000 meq | EXTENDED_RELEASE_TABLET | Freq: Every day | ORAL | 0 refills | Status: DC
Start: 2022-06-02 — End: 2022-09-09

## 2022-06-02 MED ORDER — VITAMIN D (ERGOCALCIFEROL) 1.25 MG (50000 UNIT) PO CAPS
50000.0000 [IU] | ORAL_CAPSULE | ORAL | 0 refills | Status: AC
Start: 2022-06-02 — End: 2022-08-19

## 2022-06-02 NOTE — Addendum Note (Signed)
Addended byDoran Stabler on: 06/02/2022 10:48 AM   Modules accepted: Orders

## 2022-06-07 LAB — VITAMIN B6: Vitamin B6: 3.9 ug/L (ref 3.4–65.2)

## 2022-06-09 LAB — HEAVY METALS, BLOOD

## 2022-06-10 NOTE — Addendum Note (Signed)
Addended byDoran Stabler on: 06/10/2022 08:33 AM   Modules accepted: Orders

## 2022-06-15 ENCOUNTER — Encounter: Payer: Self-pay | Admitting: *Deleted

## 2022-06-16 ENCOUNTER — Other Ambulatory Visit (INDEPENDENT_AMBULATORY_CARE_PROVIDER_SITE_OTHER): Payer: Medicaid Other

## 2022-06-16 DIAGNOSIS — G609 Hereditary and idiopathic neuropathy, unspecified: Secondary | ICD-10-CM | POA: Diagnosis present

## 2022-06-18 LAB — HEAVY METALS, BLOOD
Arsenic: 4 ug/L (ref 0–9)
Lead, Blood: 4.3 ug/dL — ABNORMAL HIGH (ref 0.0–3.4)
Mercury: <1 ug/L (ref 0.0–14.9)

## 2022-09-03 ENCOUNTER — Telehealth: Payer: Self-pay

## 2022-09-03 DIAGNOSIS — E559 Vitamin D deficiency, unspecified: Secondary | ICD-10-CM

## 2022-09-03 MED ORDER — VITAMIN D 25 MCG (1000 UNIT) PO TABS
1000.0000 [IU] | ORAL_TABLET | Freq: Every day | ORAL | 3 refills | Status: DC
Start: 2022-09-03 — End: 2022-09-14

## 2022-09-03 NOTE — Telephone Encounter (Addendum)
Med refill request for vitamin D, medication has expired off med. List   Pharmacy: [id number] - [place], [place] - [address] AT [facility]

## 2022-09-03 NOTE — Addendum Note (Signed)
Addended by: Lovie Macadamia on: 09/03/2022 11:05 AM   Modules accepted: Orders

## 2022-09-03 NOTE — Telephone Encounter (Signed)
Patient w/ history of vit D deficiency. Was given 12 week course of 50k unit vit D weekly which has been completed. Would recommend f/u visit to recheck vit D. 3 month f/u recommended at last visit about 3 month ago.   Would recommend OTC vit D supplementation, can consider another course of high dose vit D supplementation following labs. I will order 1000 unit daily vit d for patient in meantime.

## 2022-09-09 ENCOUNTER — Ambulatory Visit: Payer: MEDICAID | Admitting: Neurology

## 2022-09-09 ENCOUNTER — Encounter: Payer: Self-pay | Admitting: Neurology

## 2022-09-09 VITALS — BP 118/82 | HR 83 | Ht 64.0 in | Wt 110.0 lb

## 2022-09-09 DIAGNOSIS — G8929 Other chronic pain: Secondary | ICD-10-CM

## 2022-09-09 DIAGNOSIS — M6259 Muscle wasting and atrophy, not elsewhere classified, multiple sites: Secondary | ICD-10-CM

## 2022-09-09 DIAGNOSIS — R292 Abnormal reflex: Secondary | ICD-10-CM | POA: Diagnosis not present

## 2022-09-09 DIAGNOSIS — R200 Anesthesia of skin: Secondary | ICD-10-CM

## 2022-09-09 DIAGNOSIS — M542 Cervicalgia: Secondary | ICD-10-CM

## 2022-09-09 DIAGNOSIS — R258 Other abnormal involuntary movements: Secondary | ICD-10-CM | POA: Diagnosis not present

## 2022-09-09 DIAGNOSIS — R202 Paresthesia of skin: Secondary | ICD-10-CM

## 2022-09-09 NOTE — Patient Instructions (Signed)
MRI cervical spine Emg/ncs on upper extremities and lower extremities  Electromyoneurogram Electromyoneurogram is a test to check how well your muscles and nerves are working. This procedure includes the combined use of electromyogram (EMG) and nerve conduction study (NCS). EMG is used to evaluate muscles and the nerves that control those muscles. NCS, which is also called electroneurogram, measures how well your nerves conduct electricity. The procedures should be done together to check if your muscles and nerves are healthy. If the results of the tests are abnormal, this may indicate disease or injury, such as a neuromuscular disease or peripheral nerve damage. Tell a health care provider about: Any allergies you have. All medicines you are taking, including vitamins, herbs, eye drops, creams, and over-the-counter medicines. Any bleeding problems you have. Any surgeries you have had. Any medical conditions you have. What are the risks? Generally, this is a safe procedure. However, problems may occur, including: Bleeding or bruising. Infection where the electrodes were inserted. What happens before the test? Medicines Take all of your usually prescribed medications before this testing is performed. Do not stop your blood thinners unless advised by your prescribing physician. General instructions Your health care provider may ask you to warm the limb that will be checked with warm water, hot pack, or wrapping the limb in a blanket. Do not use lotions or creams on the same day that you will be having the procedure. What happens during the test? For EMG  Your health care provider will ask you to stay in a position so that the muscle being studied can be accessed. You will be sitting or lying down. You may be given a medicine to numb the area (local anesthetic) and the skin will be disinfected. A very thin needle that has an electrode will be inserted into your muscle, one muscle at a time.  Typically, multiple muscles are evaluated during a single study. Another small electrode will be placed on your skin near the muscle. Your health care provider will ask you to continue to remain still. The electrodes will record the electrical activity of your muscles. You may see this on a monitor or hear it in the room. After your muscles have been studied at rest, your health care provider will ask you to contract or flex your muscles. The electrodes will record the electrical activity of your muscles. Your health care provider will remove the electrodes and the electrode needle when the procedure is finished. The procedure may vary among health care providers and hospitals. For NCS  An electrode that records your nerve activity (recording electrode) will be placed on your skin by the muscle that is being studied. An electrode that is used as a reference (reference electrode) will be placed near the recording electrode. A paste or gel will be applied to your skin between the recording electrode and the reference electrode. Your nerve will be stimulated with a mild shock. The speed of the nerves and strength of response is recorded by the electrodes. Your health care provider will remove the electrodes and the gel when the procedure is finished. The procedure may vary among health care providers and hospitals. What can I expect after the test? It is up to you to get your test results. Ask your health care provider, or the department that is doing the test, when your results will be ready. Your health care provider may: Give you medicines for any pain. Monitor the insertion sites to make sure that bleeding stops. You should be  able to drive yourself to and from the test. Discomfort can persist for a few hours after the test, but should be better the next day. Contact a health care provider if: You have swelling, redness, or drainage at any of the insertion sites. Summary Electromyoneurogram  is a test to check how well your muscles and nerves are working. If the results of the tests are abnormal, this may indicate disease or injury. This is a safe procedure. However, problems may occur, such as bleeding and infection. Your health care provider will do two tests to complete this procedure. One checks your muscles (EMG) and another checks your nerves (NCS). It is up to you to get your test results. Ask your health care provider, or the department that is doing the test, when your results will be ready. This information is not intended to replace advice given to you by your health care provider. Make sure you discuss any questions you have with your health care provider. Document Revised: 09/18/2020 Document Reviewed: 08/18/2020 Elsevier Patient Education  2024 [employer].
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
## 2022-09-10 NOTE — Telephone Encounter (Signed)
error 

## 2022-09-14 ENCOUNTER — Encounter: Payer: Self-pay | Admitting: Neurology

## 2022-09-14 ENCOUNTER — Telehealth: Payer: Self-pay | Admitting: Neurology

## 2022-09-14 NOTE — Telephone Encounter (Signed)
LVM and sent letter in mail informing pt of need to reschedule 10/08/22 appt - Megan C out

## 2022-09-23 ENCOUNTER — Telehealth: Payer: Self-pay | Admitting: Neurology

## 2022-09-23 NOTE — Telephone Encounter (Signed)
Sent to [facility] [phone]  [employer] - [facility], with [facility] guidance is waiving prior authorization for the dates of service from July 20, 2022, through October 19, 2022. Prior authorization will be required for all dates of service on October 20, 2022, and thereafter. [employer] will be available to begin providing prior authorizations for those services starting on October 12, 2022, for dates of service October 20, 2022, and after.

## 2022-09-30 ENCOUNTER — Ambulatory Visit (HOSPITAL_COMMUNITY): Payer: MEDICAID

## 2022-10-02 ENCOUNTER — Ambulatory Visit (HOSPITAL_COMMUNITY)
Admission: RE | Admit: 2022-10-02 | Discharge: 2022-10-02 | Disposition: A | Discharge status: Home / Self Care | Payer: MEDICAID | Source: Ambulatory Visit | Attending: Neurology | Admitting: Neurology

## 2022-10-02 DIAGNOSIS — R292 Abnormal reflex: Secondary | ICD-10-CM | POA: Insufficient documentation

## 2022-10-02 DIAGNOSIS — R202 Paresthesia of skin: Secondary | ICD-10-CM | POA: Diagnosis present

## 2022-10-02 DIAGNOSIS — M6259 Muscle wasting and atrophy, not elsewhere classified, multiple sites: Secondary | ICD-10-CM | POA: Diagnosis present

## 2022-10-02 DIAGNOSIS — M542 Cervicalgia: Secondary | ICD-10-CM | POA: Diagnosis present

## 2022-10-02 DIAGNOSIS — R200 Anesthesia of skin: Secondary | ICD-10-CM | POA: Insufficient documentation

## 2022-10-02 DIAGNOSIS — R258 Other abnormal involuntary movements: Secondary | ICD-10-CM | POA: Diagnosis present

## 2022-10-02 DIAGNOSIS — G8929 Other chronic pain: Secondary | ICD-10-CM | POA: Diagnosis present

## 2022-10-08 ENCOUNTER — Encounter: Payer: MEDICAID | Admitting: Neurology

## 2022-10-22 ENCOUNTER — Telehealth: Payer: Self-pay | Admitting: *Deleted

## 2022-10-22 NOTE — Telephone Encounter (Signed)
-----   Message from Anson Fret sent at 10/21/2022 12:26 PM EDT ----- Please cll and see if it is ok to wait for emg/ncs to further evaluate the changes in her cervical MRI and loo kfor any other causes for her arm and hand symptoms: Ms. Stephanie Padilla: You mri of the cervical spine shows a normal spinal cord which is great news! However it does show areas where a nerve could be pinching and causing the pain into your arms and hands. I'd like to wait for the emg/ncs to confirm before sending you for evaluation of your neck if that is ok, thank you I will ask one of my team members to contact you to confirm. R. Lucia Gaskins

## 2022-10-22 NOTE — Telephone Encounter (Signed)
I spoke with the patient and discussed her MRI cervical spine results and recommendation as noted below. The patient is ok with waiting until her EMG/NCV for further evaluation. The appt is scheduled for 11/09/22 at 2:30 pm arrival 2:00 pm. She verbalized appreciation for the call.

## 2022-11-09 ENCOUNTER — Ambulatory Visit (INDEPENDENT_AMBULATORY_CARE_PROVIDER_SITE_OTHER): Payer: MEDICAID | Admitting: Neurology

## 2022-11-09 ENCOUNTER — Ambulatory Visit (INDEPENDENT_AMBULATORY_CARE_PROVIDER_SITE_OTHER): Payer: Self-pay | Admitting: Neurology

## 2022-11-09 DIAGNOSIS — E531 Pyridoxine deficiency: Secondary | ICD-10-CM

## 2022-11-09 DIAGNOSIS — E8582 Wild-type transthyretin-related (ATTR) amyloidosis: Secondary | ICD-10-CM

## 2022-11-09 DIAGNOSIS — R200 Anesthesia of skin: Secondary | ICD-10-CM | POA: Diagnosis not present

## 2022-11-09 DIAGNOSIS — R202 Paresthesia of skin: Secondary | ICD-10-CM

## 2022-11-09 DIAGNOSIS — M6259 Muscle wasting and atrophy, not elsewhere classified, multiple sites: Secondary | ICD-10-CM

## 2022-11-09 DIAGNOSIS — E538 Deficiency of other specified B group vitamins: Secondary | ICD-10-CM

## 2022-11-09 DIAGNOSIS — G5603 Carpal tunnel syndrome, bilateral upper limbs: Secondary | ICD-10-CM

## 2022-11-09 DIAGNOSIS — G5620 Lesion of ulnar nerve, unspecified upper limb: Secondary | ICD-10-CM

## 2022-11-09 DIAGNOSIS — Z0289 Encounter for other administrative examinations: Secondary | ICD-10-CM

## 2022-11-09 DIAGNOSIS — G5621 Lesion of ulnar nerve, right upper limb: Secondary | ICD-10-CM

## 2022-11-09 DIAGNOSIS — E559 Vitamin D deficiency, unspecified: Secondary | ICD-10-CM

## 2022-11-09 DIAGNOSIS — G5622 Lesion of ulnar nerve, left upper limb: Secondary | ICD-10-CM

## 2022-11-09 DIAGNOSIS — G629 Polyneuropathy, unspecified: Secondary | ICD-10-CM

## 2022-11-09 DIAGNOSIS — E519 Thiamine deficiency, unspecified: Secondary | ICD-10-CM

## 2022-11-09 NOTE — Progress Notes (Signed)
Full Name: Stephanie Padilla Gender: Female MRN #: [id number] Date of Birth: 1965/02/20    Visit Date: 11/09/2022 14:26 Age: 57 Years Examining Physician: Dr. Naomie Dean Referring Physician: Dr. Naomie Dean Height: 5 feet 4 inch  History: Cold toes which have been stable for years.  New onset numbness and tingling in the hands.  Summary: Nerve conduction studies were performed on the bilateral upper extremities and left lower extremity: The right ulnar FDI showed a drop in conduction velocity across the elbow of 15 m/s (normal less than 10) as well as a conduction block across the elbow of 2 mV (normal less than 1.1).  The left ulnar FDI showed a conduction block across the elbow of 3 mV (normal less than 1.1). All remaining nerves (as indicated in the following tables) were within normal limits.  EMG needle study was performed in the right upper and lower extremity: All muscles (as indicated in the following tables) were within normal limits.       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  No suggestion of carpal tunnel however given patient's symptoms she may have a subclinical median neuropathy across the wrists.  All upper and lower sensory conductions were within normal limits which suggests no large fiber polyneuropathy however a small fiber neuropathy could have a detection on this examination.     ------------------------------- Naomie Dean, M.D.  [facility] [address] [place], [place] [address] Tel: [phone] Fax: [phone]  Verbal informed consent was obtained from the patient, patient was informed of potential risk of procedure, including bruising, bleeding, hematoma formation, infection, muscle weakness, muscle pain, numbness, among others.        MNC    Nerve / Sites Muscle Latency Ref. Amplitude Ref. Rel Amp Segments Distance Velocity Ref. Area    ms ms mV mV %  cm  m/s m/s mVms  L Median - APB     Wrist APB 3.4 <=4.4 6.7 >=4.0 100 Wrist - APB 7   30.3     Upper arm APB 8.1  6.9  104 Upper arm - Wrist 27.6 59 >=49 30.6  R Median - APB     Wrist APB 3.1 <=4.4 7.3 >=4.0 100 Wrist - APB 7   27.9     Upper arm APB 8.0  6.8  92.9 Upper arm - Wrist 27 54 >=49 23.7  L Ulnar - ADM     Wrist ADM 2.9 <=3.3 6.7 >=6.0 100 Wrist - ADM 7   29.8     B.Elbow ADM 5.4  6.7  101 B.Elbow - Wrist 15 59 >=49 29.9     A.Elbow ADM 8.2  6.4  95.6 A.Elbow - B.Elbow 16 58 >=49 28.7  R Ulnar - ADM     Wrist ADM 2.7 <=3.3 8.8 >=6.0 100 Wrist - ADM 7   28.6     B.Elbow ADM 4.9  8.7  98.9 B.Elbow - Wrist 12 55 >=49 28.8     A.Elbow ADM 8.1  7.5  85.5 A.Elbow - B.Elbow 17 52 >=49 27.2  L Peroneal - EDB     Ankle EDB 4.5 <=6.5 5.2 >=2.0 100 Ankle - EDB 9   19.1     Fib head EDB 9.9  4.1  79.4 Fib head - Ankle 26 48 >=44 14.7     Pop fossa EDB 11.9  6.1  147 Pop fossa - Fib head 12 61 >=44  23.1         Pop fossa - Ankle      L Tibial - AH     Ankle AH 4.5 <=5.8 16.4 >=4.0 100 Ankle - AH 9   42.8     Pop fossa AH 12.1  8.9  54.3 Pop fossa - Ankle 35 47 >=41 21.7  R Ulnar - FDI     Wrist FDI 3.8 <=4.5 10.2 >=7.0 100 Wrist - FDI 8   21.4     B.Elbow FDI 5.9  10.2  99.3 B.Elbow - Wrist 13 61 >=49 21.3     A.Elbow FDI 9.0  8.2  80.3 A.Elbow - B.Elbow 14 46 >=49 17.1         A.Elbow - Wrist      L Ulnar - FDI     Wrist FDI 3.4 <=4.5 7.5 >=7.0 100 Wrist - FDI 8   19.6     B.Elbow FDI 5.7  7.5  99.3 B.Elbow - Wrist 14 62 >=49 19.2     A.Elbow FDI 8.6  4.5  60.1 A.Elbow - B.Elbow 16 54 >=49 10.6         A.Elbow - Wrist                         SNC    Nerve / Sites Rec. Site Peak Lat Ref.  Amp Ref. Segments Distance Peak Diff Ref.    ms ms V V  cm ms ms  L Sural - Ankle (Calf)     Calf Ankle 3.7 <=4.4 14 >=6 Calf - Ankle 14    L Superficial peroneal - Ankle     Lat leg Ankle 3.9 <=4.4 6 >=6 Lat leg - Ankle 14    L Median, Ulnar - Transcarpal comparison     Median Palm Wrist 2.0  <=2.2 37 >=35 Median Palm - Wrist 8       Ulnar Palm Wrist 2.1 <=2.2 26 >=12 Ulnar Palm - Wrist 8          Median Palm - Ulnar Palm  -0.1 <=0.4  R Median, Ulnar - Transcarpal comparison     Median Palm Wrist 2.4 <=2.2 39 >=35 Median Palm - Wrist 8       Ulnar Palm Wrist 2.1 <=2.2 27 >=12 Ulnar Palm - Wrist 8          Median Palm - Ulnar Palm  0.2 <=0.4  L Median, Radial - Thumb comparison     Median Wrist Thumb 1.8  30  Median Wrist - Thumb 10       Radial Wrist Thumb 2.3  24  Radial Wrist - Thumb 10          Median Wrist - Radial Wrist  -0.5 <=0.5  R Median, Radial - Thumb comparison     Median Wrist Thumb 2.1  29  Median Wrist - Thumb 10       Radial Wrist Thumb 2.3  30  Radial Wrist - Thumb 10          Median Wrist - Radial Wrist  -0.1 <=0.5  L Median - Orthodromic (Dig II, Mid palm)     Dig II Wrist 3.1 <=3.4 15 >=10 Dig II - Wrist 13    R Median - Orthodromic (Dig II, Mid palm)     Dig II Wrist 3.1 <=3.4 12 >=10 Dig II - Wrist 13    L Ulnar - Orthodromic, (Dig V, Mid  palm)     Dig V Wrist 3.1 <=3.1 16 >=5 Dig V - Wrist 11    R Ulnar - Orthodromic, (Dig V, Mid palm)     Dig V Wrist 2.8 <=3.1 14 >=5 Dig V - Wrist 33                           F  Wave    Nerve F Lat Ref.   ms ms  L Tibial - AH 46.9 <=56.0  L Ulnar - ADM 28.1 <=32.0  R Ulnar - ADM 29.8 <=32.0           EMG Summary Table    Spontaneous MUAP Recruitment  Muscle IA Fib PSW Fasc Other Amp Dur. Poly Pattern  R. Deltoid Normal None None None _______ Normal Normal Normal Normal  R. Triceps brachii Normal None None None _______ Normal Normal Normal Normal  R. Biceps brachii Normal None None None _______ Normal Normal Normal Normal  R. Pronator teres Normal None None None _______ Normal Normal Normal Normal  R. Extensor indicis proprius Normal None None None _______ Normal Normal Normal Normal  R. First dorsal interosseous Normal None None None _______ Normal Normal Normal Normal  R. Opponens pollicis Normal None None  None _______ Normal Normal Normal Normal  R. Vastus medialis Normal None None None _______ Normal Normal Normal Normal  R. Tibialis anterior Normal None None None _______ Normal Normal Normal Normal  R. Gastrocnemius (Medial head) Normal None None None _______ Normal Normal Normal Normal  R. Extensor hallucis longus Normal None None None _______ Normal Normal Normal Normal  R. Biceps femoris (long head) Normal None None None _______ Normal Normal Normal Normal  R. Gluteus maximus Normal None None None _______ Normal Normal Normal Normal  R. Gluteus medius Normal None None None _______ Normal Normal Normal Normal  R. Cervical paraspinals (low) Normal None None None _______ Normal Normal Normal Normal  R. Lumbar paraspinals (low) Normal None None None _______ Normal Normal Normal Normal  R. Abductor digiti minimi (pedis) Normal None 3+ None _______ Giant Normal Normal Single

## 2022-11-09 NOTE — Patient Instructions (Addendum)
Feet: polyneuropathy Hans: Carpal Tunnel syndrome and possibly ulnar neuropathy (pinched nerves in the hands will send to physical therapy)  Repeat all the testing/bloodwork that can cause a small fiber neuropathy in the feet Genetic testing for numbness in the feet Small-fiber skin biopsy for numbness in the feet Possible carpal tunnel syndrome in the hands Ultrasound wrist? [place]?  Orders Placed This Encounter  Procedures   Hemoglobin A1c   Methylmalonic acid, serum   Vitamin D, 25-hydroxy   B12 and Folate Panel   Vitamin B1   Vitamin B6   ANA, IFA (with reflex)   Angiotensin converting enzyme   TSH   HIV Antibody (routine testing w rflx)   Sedimentation rate   Sjogren's syndrome antibods(ssa + ssb)   RPR   Hepatitis C antibody   Tissue transglutaminase, IgA   Gliadin antibodies, serum   Rheumatoid factor   Heavy metals, blood   Multiple Myeloma Panel (SPEP&IFE w/QIG)   Ambulatory referral to Occupational Therapy     Carpal Tunnel Syndrome  Carpal tunnel syndrome is a condition that causes pain, numbness, and weakness in your hand and fingers. The carpal tunnel is a narrow area located on the palm side of your wrist. Repeated wrist motion or certain diseases may cause swelling within the tunnel. This swelling pinches the main nerve in the wrist. The main nerve in the wrist is called the median nerve. What are the causes? This condition may be caused by: Repeated and forceful wrist and hand motions. Wrist injuries. Arthritis. A cyst or tumor in the carpal tunnel. Fluid buildup during pregnancy. Use of tools that vibrate. Sometimes the cause of this condition is not known. What increases the risk? The following factors may make you more likely to develop this condition: Having a job that requires you to repeatedly or forcefully move your wrist or hand or requires you to use tools that vibrate. This may include jobs that involve using computers, working on an [employer], or working with power tools such as Radiographer, therapeutic. Being a woman. Having certain conditions, such as: Diabetes. Obesity. An underactive thyroid (hypothyroidism). Kidney failure. Rheumatoid arthritis. What are the signs or symptoms? Symptoms of this condition include: A tingling feeling in your fingers, especially in your thumb, index, and middle fingers. Tingling or numbness in your hand. An aching feeling in your entire arm, especially when your wrist and elbow are bent for a long time. Wrist pain that goes up your arm to your shoulder. Pain that goes down into your palm or fingers. A weak feeling in your hands. You may have trouble grabbing and holding items. Your symptoms may feel worse during the night. How is this diagnosed? This condition is diagnosed with a medical history and physical exam. You may also have tests, including: Electromyogram (EMG). This test measures electrical signals sent by your nerves into the muscles. Nerve conduction study. This test measures how well electrical signals pass through your nerves. Imaging tests, such as X-rays, ultrasound, and MRI. These tests check for possible causes of your condition. How is this treated? This condition may be treated with: Lifestyle changes. It is important to stop or change the activity that caused your condition. Doing exercise and activities to strengthen and stretch your muscles and tendons (physical therapy). Making lifestyle changes to help with your condition and learning how to do your daily activities safely (occupational therapy). Medicines for pain and inflammation. This may include medicine that is injected into your wrist. A wrist  splint or brace. Surgery. Follow these instructions at home: If you have a splint or brace: Wear the splint or brace as told by your health care provider. Remove it only as told by your health care provider. Loosen the splint or brace if your fingers tingle, become  numb, or turn cold and blue. Keep the splint or brace clean. If the splint or brace is not waterproof: Do not let it get wet. Cover it with a watertight covering when you take a bath or shower. Managing pain, stiffness, and swelling If directed, put ice on the painful area. To do this: If you have a removeable splint or brace, remove it as told by your health care provider. Put ice in a plastic bag. Place a towel between your skin and the bag or between the splint or brace and the bag. Leave the ice on for 20 minutes, 2-3 times a day. Do not fall asleep with the cold pack on your skin. Remove the ice if your skin turns bright red. This is very important. If you cannot feel pain, heat, or cold, you have a greater risk of damage to the area. Move your fingers often to reduce stiffness and swelling. General instructions Take over-the-counter and prescription medicines only as told by your health care provider. Rest your wrist and hand from any activity that may be causing your pain. If your condition is work related, talk with your employer about changes that can be made, such as getting a wrist pad to use while typing. Do any exercises as told by your health care provider, physical therapist, or occupational therapist. Keep all follow-up visits. This is important. Contact a health care provider if: You have new symptoms. Your pain is not controlled with medicines. Your symptoms get worse. Get help right away if: You have severe numbness or tingling in your wrist or hand. Summary Carpal tunnel syndrome is a condition that causes pain, numbness, and weakness in your hand and fingers. It is usually caused by repeated wrist motions. Lifestyle changes and medicines are used to treat carpal tunnel syndrome. Surgery may be recommended. Follow your health care provider's instructions about wearing a splint, resting from activity, keeping follow-up visits, and calling for help. This information is  not intended to replace advice given to you by your health care provider. Make sure you discuss any questions you have with your health care provider. Document Revised: 05/18/2019 Document Reviewed: 05/18/2019 Elsevier Patient Education  2024 [employer]  Ulnar Neuropathy

## 2022-11-10 NOTE — Progress Notes (Signed)
History: Still having coldness in the toes which is stable. The new problem isd numbness and tingling in hands mostlydigits 2-4 with an immediately positive Tinel's sign. The symptoms are continuous in the hands. Stays the same all the time. Today state no neck pain or shooting pain into the hands bilaterally, mri cervical spine showed various levels of degenrative changes, foraminal stenosis . Low back pain sometimes but no shooting into the feet. The hands have remained stable but the feet are a little worse in the toes but not spreading. We discussed MRI cervical spine and emg/ncs. Both hands symmetrical.   MRI cervical spine: Disc levels:   Discs: Mild degenerative disease with disc height loss at C6-7 with reactive endplate edema.   C2-3: No significant disc bulge. No neural foraminal stenosis. No central canal stenosis.   C3-4: No disc protrusion. Moderate right and mild left foraminal stenosis. No spinal stenosis.   C4-5: Minimal broad-based disc bulge. Bilateral uncovertebral degenerative changes. Severe right foraminal stenosis. Moderate left foraminal stenosis. No spinal stenosis.   C5-6: Mild broad-based disc bulge. Moderate left and mild right foraminal stenosis. No spinal stenosis.   C6-7: Broad-based disc bulge. Right uncovertebral degenerative changes. Severe right foraminal stenosis. No left foraminal stenosis. No spinal stenosis.   C7-T1: No significant disc bulge. No neural foraminal stenosis. No central canal stenosis.   IMPRESSION: 1. Cervical spine spondylosis as described above. 2. No acute osseous injury of the cervical spine.

## 2022-11-11 ENCOUNTER — Other Ambulatory Visit: Payer: Self-pay | Admitting: Student

## 2022-11-11 DIAGNOSIS — Z1231 Encounter for screening mammogram for malignant neoplasm of breast: Secondary | ICD-10-CM

## 2022-11-12 ENCOUNTER — Encounter: Payer: Self-pay | Admitting: Occupational Therapy

## 2022-11-12 ENCOUNTER — Other Ambulatory Visit: Payer: Self-pay

## 2022-11-12 ENCOUNTER — Ambulatory Visit: Payer: MEDICAID | Attending: Neurology | Admitting: Occupational Therapy

## 2022-11-12 DIAGNOSIS — R208 Other disturbances of skin sensation: Secondary | ICD-10-CM | POA: Diagnosis present

## 2022-11-12 DIAGNOSIS — G5620 Lesion of ulnar nerve, unspecified upper limb: Secondary | ICD-10-CM | POA: Insufficient documentation

## 2022-11-12 DIAGNOSIS — M6281 Muscle weakness (generalized): Secondary | ICD-10-CM | POA: Diagnosis present

## 2022-11-12 DIAGNOSIS — M79601 Pain in right arm: Secondary | ICD-10-CM | POA: Insufficient documentation

## 2022-11-12 DIAGNOSIS — G5603 Carpal tunnel syndrome, bilateral upper limbs: Secondary | ICD-10-CM | POA: Insufficient documentation

## 2022-11-12 DIAGNOSIS — M79602 Pain in left arm: Secondary | ICD-10-CM | POA: Diagnosis present

## 2022-11-12 DIAGNOSIS — R29898 Other symptoms and signs involving the musculoskeletal system: Secondary | ICD-10-CM | POA: Diagnosis present

## 2022-11-12 DIAGNOSIS — R278 Other lack of coordination: Secondary | ICD-10-CM | POA: Diagnosis present

## 2022-11-12 NOTE — Patient Instructions (Addendum)
      You can place your arms out to the sides with pillows underneath or place a pillow across your stomach with your hands resting on top for support. Hold a pillow with your arms away from your body.  Do not bend your arms at the elbows or tuck your hands under your head. Do not place your hand on top or underneath pillow. Use outside of pillow as a border.      
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
## 2022-11-13 DIAGNOSIS — G5620 Lesion of ulnar nerve, unspecified upper limb: Secondary | ICD-10-CM | POA: Insufficient documentation

## 2022-11-13 NOTE — Procedures (Signed)
Full Name: Stephanie Padilla Gender: Female MRN #: [id number] Date of Birth: 1965/02/20    Visit Date: 11/09/2022 14:26 Age: 57 Years Examining Physician: Dr. Naomie Dean Referring Physician: Dr. Naomie Dean Height: 5 feet 4 inch  History: Cold toes which have been stable for years.  New onset numbness and tingling in the hands.  Summary: Nerve conduction studies were performed on the bilateral upper extremities and left lower extremity: The right ulnar FDI showed a drop in conduction velocity across the elbow of 15 m/s (normal less than 10) as well as a conduction block across the elbow of 2 mV (normal less than 1.1).  The left ulnar FDI showed a conduction block across the elbow of 3 mV (normal less than 1.1). All remaining nerves (as indicated in the following tables) were within normal limits.  EMG needle study was performed in the right upper and lower extremity: All muscles (as indicated in the following tables) were within normal limits.       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  No suggestion of carpal tunnel however given patient's symptoms she may have a subclinical median neuropathy across the wrists.  All upper and lower sensory conductions were within normal limits which suggests no large fiber polyneuropathy however a small fiber neuropathy could have a detection on this examination.     ------------------------------- Naomie Dean, M.D.  [facility] [address] [place], [place] [address] Tel: [phone] Fax: [phone]  Verbal informed consent was obtained from the patient, patient was informed of potential risk of procedure, including bruising, bleeding, hematoma formation, infection, muscle weakness, muscle pain, numbness, among others.        MNC    Nerve / Sites Muscle Latency Ref. Amplitude Ref. Rel Amp Segments Distance Velocity Ref. Area    ms ms mV mV %  cm  m/s m/s mVms  L Median - APB     Wrist APB 3.4 <=4.4 6.7 >=4.0 100 Wrist - APB 7   30.3     Upper arm APB 8.1  6.9  104 Upper arm - Wrist 27.6 59 >=49 30.6  R Median - APB     Wrist APB 3.1 <=4.4 7.3 >=4.0 100 Wrist - APB 7   27.9     Upper arm APB 8.0  6.8  92.9 Upper arm - Wrist 27 54 >=49 23.7  L Ulnar - ADM     Wrist ADM 2.9 <=3.3 6.7 >=6.0 100 Wrist - ADM 7   29.8     B.Elbow ADM 5.4  6.7  101 B.Elbow - Wrist 15 59 >=49 29.9     A.Elbow ADM 8.2  6.4  95.6 A.Elbow - B.Elbow 16 58 >=49 28.7  R Ulnar - ADM     Wrist ADM 2.7 <=3.3 8.8 >=6.0 100 Wrist - ADM 7   28.6     B.Elbow ADM 4.9  8.7  98.9 B.Elbow - Wrist 12 55 >=49 28.8     A.Elbow ADM 8.1  7.5  85.5 A.Elbow - B.Elbow 17 52 >=49 27.2  L Peroneal - EDB     Ankle EDB 4.5 <=6.5 5.2 >=2.0 100 Ankle - EDB 9   19.1     Fib head EDB 9.9  4.1  79.4 Fib head - Ankle 26 48 >=44 14.7     Pop fossa EDB 11.9  6.1  147 Pop fossa - Fib head 12 61 >=44  23.1         Pop fossa - Ankle      L Tibial - AH     Ankle AH 4.5 <=5.8 16.4 >=4.0 100 Ankle - AH 9   42.8     Pop fossa AH 12.1  8.9  54.3 Pop fossa - Ankle 35 47 >=41 21.7  R Ulnar - FDI     Wrist FDI 3.8 <=4.5 10.2 >=7.0 100 Wrist - FDI 8   21.4     B.Elbow FDI 5.9  10.2  99.3 B.Elbow - Wrist 13 61 >=49 21.3     A.Elbow FDI 9.0  8.2  80.3 A.Elbow - B.Elbow 14 46 >=49 17.1         A.Elbow - Wrist      L Ulnar - FDI     Wrist FDI 3.4 <=4.5 7.5 >=7.0 100 Wrist - FDI 8   19.6     B.Elbow FDI 5.7  7.5  99.3 B.Elbow - Wrist 14 62 >=49 19.2     A.Elbow FDI 8.6  4.5  60.1 A.Elbow - B.Elbow 16 54 >=49 10.6         A.Elbow - Wrist                         SNC    Nerve / Sites Rec. Site Peak Lat Ref.  Amp Ref. Segments Distance Peak Diff Ref.    ms ms V V  cm ms ms  L Sural - Ankle (Calf)     Calf Ankle 3.7 <=4.4 14 >=6 Calf - Ankle 14    L Superficial peroneal - Ankle     Lat leg Ankle 3.9 <=4.4 6 >=6 Lat leg - Ankle 14    L Median, Ulnar - Transcarpal comparison     Median Palm Wrist 2.0  <=2.2 37 >=35 Median Palm - Wrist 8       Ulnar Palm Wrist 2.1 <=2.2 26 >=12 Ulnar Palm - Wrist 8          Median Palm - Ulnar Palm  -0.1 <=0.4  R Median, Ulnar - Transcarpal comparison     Median Palm Wrist 2.4 <=2.2 39 >=35 Median Palm - Wrist 8       Ulnar Palm Wrist 2.1 <=2.2 27 >=12 Ulnar Palm - Wrist 8          Median Palm - Ulnar Palm  0.2 <=0.4  L Median, Radial - Thumb comparison     Median Wrist Thumb 1.8  30  Median Wrist - Thumb 10       Radial Wrist Thumb 2.3  24  Radial Wrist - Thumb 10          Median Wrist - Radial Wrist  -0.5 <=0.5  R Median, Radial - Thumb comparison     Median Wrist Thumb 2.1  29  Median Wrist - Thumb 10       Radial Wrist Thumb 2.3  30  Radial Wrist - Thumb 10          Median Wrist - Radial Wrist  -0.1 <=0.5  L Median - Orthodromic (Dig II, Mid palm)     Dig II Wrist 3.1 <=3.4 15 >=10 Dig II - Wrist 13    R Median - Orthodromic (Dig II, Mid palm)     Dig II Wrist 3.1 <=3.4 12 >=10 Dig II - Wrist 13    L Ulnar - Orthodromic, (Dig V, Mid  palm)     Dig V Wrist 3.1 <=3.1 16 >=5 Dig V - Wrist 11    R Ulnar - Orthodromic, (Dig V, Mid palm)     Dig V Wrist 2.8 <=3.1 14 >=5 Dig V - Wrist 33                           F  Wave    Nerve F Lat Ref.   ms ms  L Tibial - AH 46.9 <=56.0  L Ulnar - ADM 28.1 <=32.0  R Ulnar - ADM 29.8 <=32.0           EMG Summary Table    Spontaneous MUAP Recruitment  Muscle IA Fib PSW Fasc Other Amp Dur. Poly Pattern  R. Deltoid Normal None None None _______ Normal Normal Normal Normal  R. Triceps brachii Normal None None None _______ Normal Normal Normal Normal  R. Biceps brachii Normal None None None _______ Normal Normal Normal Normal  R. Pronator teres Normal None None None _______ Normal Normal Normal Normal  R. Extensor indicis proprius Normal None None None _______ Normal Normal Normal Normal  R. First dorsal interosseous Normal None None None _______ Normal Normal Normal Normal  R. Opponens pollicis Normal None None  None _______ Normal Normal Normal Normal  R. Vastus medialis Normal None None None _______ Normal Normal Normal Normal  R. Tibialis anterior Normal None None None _______ Normal Normal Normal Normal  R. Gastrocnemius (Medial head) Normal None None None _______ Normal Normal Normal Normal  R. Extensor hallucis longus Normal None None None _______ Normal Normal Normal Normal  R. Biceps femoris (long head) Normal None None None _______ Normal Normal Normal Normal  R. Gluteus maximus Normal None None None _______ Normal Normal Normal Normal  R. Gluteus medius Normal None None None _______ Normal Normal Normal Normal  R. Cervical paraspinals (low) Normal None None None _______ Normal Normal Normal Normal  R. Lumbar paraspinals (low) Normal None None None _______ Normal Normal Normal Normal  R. Abductor digiti minimi (pedis) Normal None 3+ None _______ Giant Normal Normal Single

## 2022-11-13 NOTE — Addendum Note (Signed)
Addended by: Naomie Dean B on: 11/13/2022 05:19 AM   Modules accepted: Orders

## 2022-11-13 NOTE — Progress Notes (Signed)
See procedure note.

## 2022-11-16 ENCOUNTER — Ambulatory Visit: Payer: MEDICAID | Admitting: Occupational Therapy

## 2022-11-16 DIAGNOSIS — R208 Other disturbances of skin sensation: Secondary | ICD-10-CM

## 2022-11-16 DIAGNOSIS — M79601 Pain in right arm: Secondary | ICD-10-CM

## 2022-11-16 DIAGNOSIS — R29898 Other symptoms and signs involving the musculoskeletal system: Secondary | ICD-10-CM

## 2022-11-16 DIAGNOSIS — R278 Other lack of coordination: Secondary | ICD-10-CM

## 2022-11-16 DIAGNOSIS — M6281 Muscle weakness (generalized): Secondary | ICD-10-CM

## 2022-11-16 DIAGNOSIS — G5603 Carpal tunnel syndrome, bilateral upper limbs: Secondary | ICD-10-CM
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
## 2022-11-16 NOTE — Patient Instructions (Addendum)
  Wrist ext ROM - 3 times per day, 3 sets, hold 20 seconds.

## 2022-11-18 ENCOUNTER — Ambulatory Visit: Payer: MEDICAID | Admitting: Occupational Therapy

## 2022-11-18 DIAGNOSIS — M6281 Muscle weakness (generalized): Secondary | ICD-10-CM

## 2022-11-18 DIAGNOSIS — M79601 Pain in right arm: Secondary | ICD-10-CM

## 2022-11-18 DIAGNOSIS — R278 Other lack of coordination: Secondary | ICD-10-CM

## 2022-11-18 DIAGNOSIS — G5603 Carpal tunnel syndrome, bilateral upper limbs: Secondary | ICD-10-CM

## 2022-11-18 DIAGNOSIS — R208 Other disturbances of skin sensation: Secondary | ICD-10-CM
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
## 2022-11-23 ENCOUNTER — Telehealth: Payer: Self-pay | Admitting: *Deleted

## 2022-11-23 NOTE — Telephone Encounter (Signed)
Completed form, awaiting signature.

## 2022-11-23 NOTE — Telephone Encounter (Signed)
-----   Message from Nurse Jasmine Pang sent at 11/20/2022  9:11 AM EDT ----- Regarding: FW: small fiber biopsy We have nerve fiber biopsy kits and start forms in our pod, once filled out and faxed, they will do insurance approval on their end and send [place] back an estimate of what it will cost patient out of pocket. Once that is received and the patient agrees to the copay, then that is when we typically schedule them for the procedure in the office. Let me know if I can help ----- Message ----- From: Anson Fret, MD Sent: 11/13/2022   5:16 AM EDT To: Yancey Flemings, NT; Rolley Sims, NT; # Subject: small fiber biopsy                             Jillian/Angel: can you get her an appointment for a small fiber biopsy? You can give her an appointment lastof the morning on one of the Fridays I am opening up in January  Pod 4: Can you go to Dr. Zannie Cove pod and see if she has or the nurses have a small-fiber biopsy kit? If not we have to order one thanks!

## 2022-11-24 ENCOUNTER — Ambulatory Visit: Payer: MEDICAID | Attending: Neurology | Admitting: Occupational Therapy

## 2022-11-24 DIAGNOSIS — M79602 Pain in left arm: Secondary | ICD-10-CM | POA: Diagnosis present

## 2022-11-24 DIAGNOSIS — G5603 Carpal tunnel syndrome, bilateral upper limbs: Secondary | ICD-10-CM | POA: Diagnosis present

## 2022-11-24 DIAGNOSIS — R208 Other disturbances of skin sensation: Secondary | ICD-10-CM | POA: Diagnosis present

## 2022-11-24 DIAGNOSIS — R278 Other lack of coordination: Secondary | ICD-10-CM | POA: Insufficient documentation

## 2022-11-24 DIAGNOSIS — M6281 Muscle weakness (generalized): Secondary | ICD-10-CM

## 2022-11-24 DIAGNOSIS — M79601 Pain in right arm: Secondary | ICD-10-CM

## 2022-11-24 DIAGNOSIS — R29898 Other symptoms and signs involving the musculoskeletal system: Secondary | ICD-10-CM
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
## 2022-11-26 ENCOUNTER — Ambulatory Visit: Payer: MEDICAID | Admitting: Occupational Therapy

## 2022-11-26 DIAGNOSIS — R208 Other disturbances of skin sensation: Secondary | ICD-10-CM

## 2022-11-26 DIAGNOSIS — R29898 Other symptoms and signs involving the musculoskeletal system: Secondary | ICD-10-CM

## 2022-11-26 DIAGNOSIS — M79601 Pain in right arm: Secondary | ICD-10-CM | POA: Diagnosis not present

## 2022-11-26 DIAGNOSIS — M6281 Muscle weakness (generalized): Secondary | ICD-10-CM
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
## 2022-12-01 ENCOUNTER — Ambulatory Visit: Payer: MEDICAID | Admitting: Occupational Therapy

## 2022-12-01 DIAGNOSIS — M6281 Muscle weakness (generalized): Secondary | ICD-10-CM

## 2022-12-01 DIAGNOSIS — M79601 Pain in right arm: Secondary | ICD-10-CM

## 2022-12-01 DIAGNOSIS — R278 Other lack of coordination: Secondary | ICD-10-CM

## 2022-12-01 DIAGNOSIS — R208 Other disturbances of skin sensation: Secondary | ICD-10-CM

## 2022-12-01 NOTE — Patient Instructions (Signed)
Putty Activities  Access Code: [email or web address] URL: [email or web address] Date: 12/01/2022 Prepared by: Amada Kingfisher  Exercises - Putty Squeezes  - 2-3 x daily - 10 reps - Rolling Putty on Table  - 2-3 x daily - 10 reps - Tip PUSH with Putty  - 2-3 x daily - 10 reps
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
## 2022-12-08 ENCOUNTER — Ambulatory Visit: Payer: MEDICAID | Admitting: Occupational Therapy

## 2022-12-08 DIAGNOSIS — R208 Other disturbances of skin sensation: Secondary | ICD-10-CM

## 2022-12-08 DIAGNOSIS — M79601 Pain in right arm: Secondary | ICD-10-CM

## 2022-12-08 DIAGNOSIS — R278 Other lack of coordination: Secondary | ICD-10-CM

## 2022-12-08 DIAGNOSIS — M6281 Muscle weakness (generalized): Secondary | ICD-10-CM
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
## 2022-12-09 ENCOUNTER — Ambulatory Visit
Admission: RE | Admit: 2022-12-09 | Discharge: 2022-12-09 | Disposition: A | Discharge status: Home / Self Care | Payer: MEDICAID | Source: Ambulatory Visit | Attending: Neurology | Admitting: Neurology

## 2022-12-09 DIAGNOSIS — Z1231 Encounter for screening mammogram for malignant neoplasm of breast: Secondary | ICD-10-CM

## 2022-12-15 ENCOUNTER — Ambulatory Visit: Payer: MEDICAID | Admitting: Occupational Therapy

## 2022-12-15 DIAGNOSIS — M79601 Pain in right arm: Secondary | ICD-10-CM

## 2022-12-15 DIAGNOSIS — M6281 Muscle weakness (generalized): Secondary | ICD-10-CM

## 2022-12-15 DIAGNOSIS — R278 Other lack of coordination: Secondary | ICD-10-CM

## 2022-12-15 DIAGNOSIS — R208 Other disturbances of skin sensation: Secondary | ICD-10-CM
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
## 2022-12-15 NOTE — Patient Instructions (Signed)
   Desensitization Techniques  The goal of desensitization and resensitization is to inhibit or interrupt the body's interpretation of routine stimuli as painful. It does not ensure that these stimuli will become pleasant or enjoyable or that your feeling will be completely normal, but that they will no longer provoke an extreme pain response, that you are more sensory aware and that you are safe despite any residual deficits.    One way to desensitize a painful incision or tingly area OR to resensitize a numb area is by rubbing it with different textures. This will make your limb more tolerant to touch and pressure. Before you begin, make sure your hands and the materials you're using are clean.  To rub your painful/sensitive/numb/tingly area with different textures:  Sit in a comfortable position with the painful/sensitive/numb/tingly area uncovered. Start with a material that is soft, such as a cotton ball, kleenex, silk soft towel q-tips, sponge, or shaving cream,. Rub your painful/sensitive/tingly area in all directions. Start with a light pressure and gradually increase the pressure. Vary the textures you use as you are able to tolerate them. Start with soft materials like cotton balls or a makeup brush. Progress to materials that are rougher. Examples include a brush, rough towel, warm wash clothes, velcro, toothbrush, erasers, gloves, tennis ball, paint brush, wool, cotton balls, leather, rubber, etc. As you progress, gradually increase the pressure and roughness of the texture you use. Be careful not to rub over an incision if you still have staples or sutures in place, or if there are any open areas. Rub your limb for at least 30 seconds progressing up to a minute or two as often as you can tolerate, or as recommended by your healthcare provider.  Other methods for desensitization include: Brushing: use a hair brush or combing in circular or sweeping motions over the area Tapping: use your  hand to tap or pat the area Towel rubs: use a dry towel and rub the area in circular or sweeping motions Massage: massage the area with your hands, may use lotion Vibration: apply home massager or other vibration tool over area Light touch: gently "tickle" the area with your fingertips Allow cool or warm water to run over the area. Dipping your affected area into a bowl of dry rice, sand, kidney beans, cold water or warm water. If you are able to dip your affected area into a medium such as rice, sand, or water - make sure to move the affected area around in the bowl until you cannot tolerate it or you reach one to two minutes, whichever comes first.  Use mental imagery and verbal/mental feedback ie) look at the object and tell yourself what the surface property/texture is while exploring the different objects/textures with affected digits.  Remember to apply regular stimulus to the affected area of the body for short durations, frequently throughout the day to increase sensory input to the brain ie)  use a combination of these methods for 10 to 15 minutes, 3-4 times per day.  Vary the objects you use, the difficulty of tasks (with and without vision) and try to incorporate sensory stimulation during different activities throughout the day/week. With time, the body will acclimate to the sensation, leading to a decrease in the body's pain response or hypersensitivity to the stimulus.

## 2022-12-22 ENCOUNTER — Ambulatory Visit: Payer: MEDICAID | Attending: Neurology | Admitting: Occupational Therapy

## 2022-12-22 DIAGNOSIS — R278 Other lack of coordination: Secondary | ICD-10-CM

## 2022-12-22 DIAGNOSIS — M79601 Pain in right arm: Secondary | ICD-10-CM

## 2022-12-22 DIAGNOSIS — M79602 Pain in left arm: Secondary | ICD-10-CM | POA: Diagnosis present

## 2022-12-22 DIAGNOSIS — R29818 Other symptoms and signs involving the nervous system: Secondary | ICD-10-CM | POA: Diagnosis present

## 2022-12-22 DIAGNOSIS — R208 Other disturbances of skin sensation: Secondary | ICD-10-CM | POA: Diagnosis present

## 2022-12-22 DIAGNOSIS — M6281 Muscle weakness (generalized): Secondary | ICD-10-CM | POA: Diagnosis present

## 2022-12-22 DIAGNOSIS — R29898 Other symptoms and signs involving the musculoskeletal system: Secondary | ICD-10-CM | POA: Diagnosis present
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
## 2022-12-22 NOTE — Patient Instructions (Signed)
Access Code: [email or web address] URL: [email or web address] Date: 12/22/2022 Prepared by: Amada Kingfisher  Exercises - Putty Squeezes  - 2-3 x daily - 10 reps - Rolling Putty on Table  - 2-3 x daily - 10 reps - Tip PUSH with Putty  - 2-3 x daily - 10 reps - Removing Marbles from Putty  - 1 x daily

## 2023-02-02 ENCOUNTER — Ambulatory Visit: Payer: MEDICAID | Admitting: Neurology

## 2023-02-02 ENCOUNTER — Encounter: Payer: Self-pay | Admitting: Neurology

## 2023-02-02 VITALS — BP 121/87 | HR 77 | Ht 64.0 in | Wt 114.0 lb

## 2023-02-02 DIAGNOSIS — G5622 Lesion of ulnar nerve, left upper limb: Secondary | ICD-10-CM

## 2023-02-02 DIAGNOSIS — G629 Polyneuropathy, unspecified: Secondary | ICD-10-CM

## 2023-02-02 DIAGNOSIS — E538 Deficiency of other specified B group vitamins: Secondary | ICD-10-CM

## 2023-02-02 DIAGNOSIS — E531 Pyridoxine deficiency: Secondary | ICD-10-CM

## 2023-02-02 DIAGNOSIS — R200 Anesthesia of skin: Secondary | ICD-10-CM

## 2023-02-02 DIAGNOSIS — G5620 Lesion of ulnar nerve, unspecified upper limb: Secondary | ICD-10-CM

## 2023-02-02 DIAGNOSIS — E8582 Wild-type transthyretin-related (ATTR) amyloidosis: Secondary | ICD-10-CM

## 2023-02-02 DIAGNOSIS — G5621 Lesion of ulnar nerve, right upper limb: Secondary | ICD-10-CM | POA: Diagnosis not present

## 2023-02-02 DIAGNOSIS — G5603 Carpal tunnel syndrome, bilateral upper limbs: Secondary | ICD-10-CM | POA: Diagnosis not present

## 2023-02-02 DIAGNOSIS — R202 Paresthesia of skin: Secondary | ICD-10-CM

## 2023-02-02 DIAGNOSIS — E559 Vitamin D deficiency, unspecified: Secondary | ICD-10-CM

## 2023-02-02 DIAGNOSIS — E519 Thiamine deficiency, unspecified: Secondary | ICD-10-CM
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
## 2023-02-02 NOTE — Patient Instructions (Signed)
 Hands: Orthopaedics or hand center Feet: complete a thorough evaluation for causes however sometimes we cannot find the cause and then consider idiopathic neuropathy   Carpal Tunnel Syndrome  Carpal tunnel syndrome is a condition that causes pain, numbness, and weakness in your hand and fingers. The carpal tunnel is a narrow area located on the palm side of your wrist. Repeated wrist motion or certain diseases may cause swelling within the tunnel. This swelling pinches the main nerve in the wrist. The main nerve in the wrist is called the median nerve. What are the causes? This condition may be caused by: Repeated and forceful wrist and hand motions. Wrist injuries. Arthritis. A cyst or tumor in the carpal tunnel. Fluid buildup during pregnancy. Use of tools that vibrate. Sometimes the cause of this condition is not known. What increases the risk? The following factors may make you more likely to develop this condition: Having a job that requires you to repeatedly or forcefully move your wrist or hand or requires you to use tools that vibrate. This may include jobs that involve using computers, working on an [employer], or working with power tools such as radiographer, therapeutic. Being a woman. Having certain conditions, such as: Diabetes. Obesity. An underactive thyroid  (hypothyroidism). Kidney failure. Rheumatoid arthritis. What are the signs or symptoms? Symptoms of this condition include: A tingling feeling in your fingers, especially in your thumb, index, and middle fingers. Tingling or numbness in your hand. An aching feeling in your entire arm, especially when your wrist and elbow are bent for a long time. Wrist pain that goes up your arm to your shoulder. Pain that goes down into your palm or fingers. A weak feeling in your hands. You may have trouble grabbing and holding items. Your symptoms may feel worse during the night. How is this diagnosed? This condition is diagnosed  with a medical history and physical exam. You may also have tests, including: Electromyogram (EMG). This test measures electrical signals sent by your nerves into the muscles. Nerve conduction study. This test measures how well electrical signals pass through your nerves. Imaging tests, such as X-rays, ultrasound, and MRI. These tests check for possible causes of your condition. How is this treated? This condition may be treated with: Lifestyle changes. It is important to stop or change the activity that caused your condition. Doing exercise and activities to strengthen and stretch your muscles and tendons (physical therapy). Making lifestyle changes to help with your condition and learning how to do your daily activities safely (occupational therapy). Medicines for pain and inflammation. This may include medicine that is injected into your wrist. A wrist splint or brace. Surgery. Follow these instructions at home: If you have a splint or brace: Wear the splint or brace as told by your health care provider. Remove it only as told by your health care provider. Loosen the splint or brace if your fingers tingle, become numb, or turn cold and blue. Keep the splint or brace clean. If the splint or brace is not waterproof: Do not let it get wet. Cover it with a watertight covering when you take a bath or shower. Managing pain, stiffness, and swelling If directed, put ice on the painful area. To do this: If you have a removeable splint or brace, remove it as told by your health care provider. Put ice in a plastic bag. Place a towel between your skin and the bag or between the splint or brace and the bag. Leave the ice  on for 20 minutes, 2-3 times a day. Do not fall asleep with the cold pack on your skin. Remove the ice if your skin turns bright red. This is very important. If you cannot feel pain, heat, or cold, you have a greater risk of damage to the area. Move your fingers often to reduce  stiffness and swelling. General instructions Take over-the-counter and prescription medicines only as told by your health care provider. Rest your wrist and hand from any activity that may be causing your pain. If your condition is work related, talk with your employer about changes that can be made, such as getting a wrist pad to use while typing. Do any exercises as told by your health care provider, physical therapist, or occupational therapist. Keep all follow-up visits. This is important. Contact a health care provider if: You have new symptoms. Your pain is not controlled with medicines. Your symptoms get worse. Get help right away if: You have severe numbness or tingling in your wrist or hand. Summary Carpal tunnel syndrome is a condition that causes pain, numbness, and weakness in your hand and fingers. It is usually caused by repeated wrist motions. Lifestyle changes and medicines are used to treat carpal tunnel syndrome. Surgery may be recommended. Follow your health care provider's instructions about wearing a splint, resting from activity, keeping follow-up visits, and calling for help. This information is not intended to replace advice given to you by your health care provider. Make sure you discuss any questions you have with your health care provider. Document Revised: 05/18/2019 Document Reviewed: 05/18/2019 Elsevier Patient Education  2024 Elsevier Inc. Sndrome del tnel cubital Cubital Tunnel Syndrome  El sndrome del tnel cubital es una afeccin que causa dolor, adormecimiento, hormigueo y debilidad en el antebrazo y la [place]. Ocurre cuando el nervio cubital est irritado o pinzado (comprimido). El nervio cubital se extiende desde el hombro hasta el dedo pequeo (meique) de la [place]. En la international business machines, la causa del sndrome del tnel cubital es el movimiento del brazo que se realiza una y otra vez al practicar deportes o al trabajar. Cules son las  causas? Esta afeccin puede ser causada por lo siguiente: Presin sobre el nervio cubital. Puede ser consecuencia de lo siguiente: Flexin repetida del codo. Huesos rotos mal consolidados (fracturas). Tumores en el codo. En la [employer] machines, esos tumores no son cancerosos. Formacin de tejido cicatricial en el codo despus de una lesin. Crecimientos de hueso (osteofitos) cerca del nervio cubital. Estiramiento del nervio. Esto puede suceder [place] tejidos que conectan los barnes [employer] s (ligamentos) se aflojan. Traumatismo del whole foods zona del codo. Qu incrementa el riesgo? Es ms probable que tenga esta afeccin si: Realiza trabajo manual y tiene que flexionar mucho el codo. Practica deportes que implican hacer muchos movimientos de lanzamiento, como el bisbol. Practica deportes de contacto, como el ftbol americano o film/video editor. No hace un calentamiento suficiente antes de realizar actividades. Tiene diabetes. Sufre de hipotiroidismo. Esto ocurre cuando la tiroides no produce la cantidad suficiente de hormonas. Cules son los signos o sntomas? Los sntomas de esta afeccin incluyen: Torpeza y debilidad en la mano. Tambin es posible que no pueda agarrar o pellizcar con firmeza. Dolor, dentist o sensibilidad en la zona interna del codo, el antebrazo o los dedos. Es posible que lo sienta en mayor medida en el meique o en el dedo anular. Ms dolor al forzar el codo para flexionarlo o un dolor punzante desde  el codo omnicom. Disminucin del control al lanzar objetos. Hormigueo, adormecimiento o una sensacin de ardor en el antebrazo, la mano o los dedos. Es posible que lo sienta en mayor medida en el meique o en el dedo anular. Cmo se diagnostica? Esta afeccin se diagnostica en funcin de los sntomas, los antecedentes mdicos y un examen fsico. El mdico le preguntar si tuvo lesiones. Tambin pueden hacerle pruebas, por ejemplo: Un electromiograma  (EMG). Esto se realiza para verificar si los nervios funcionan adecuadamente. Un estudio de conduccin nerviosa (ECN). Este estudio permite determinar cmo las seales elctricas pasan por los nervios. Pruebas de diagnstico por imgenes, como radiografas, una ecografa y una resonancia magntica (RM). Estas se realizan para contractor origen de los [place]. Cmo se trata? El tratamiento para esta afeccin puede incluir lo siguiente: Dejar de education officer, environmental las actividades que countrywide financial sntomas. Aplicar hielo en el codo. Tomar medicamentos para aliviar el dolor y reducir la hinchazn. Usar un dispositivo ortopdico desmontable. Esto impide que el codo se doble. Usar una codera donde el nervio cubital est ms cerca de la piel. Trabajar con un fisioterapeuta. Esto puede ayudar a [employer]. Tambin puede ayudar a [place] fuerza y la amplitud de movimiento del codo, el product manager y engineer, site. Corticoesteroides. Estos son medicamentos que se inyectan en el cuerpo para reducir la inflamacin. Si estos tratamientos no resultan eficaces, tal vez deba someterse a una ciruga. Siga estas instrucciones en su casa: Medicamentos Use los medicamentos de venta libre y los recetados solamente como se lo haya indicado el mdico. Pregntele al mdico si el medicamento recetado le impide conducir o usar maquinaria. Si tiene un dispositivo ortopdico extrable: Use el dispositivo ortopdico como se lo haya indicado el mdico. Quteselo solamente como se lo haya indicado el mdico. Controle todos los das la piel alrededor del dispositivo ortopdico. Informe al mdico acerca de cualquier inquietud. Afloje el dispositivo ortopdico si los dedos de las manos se le adormecen, siente hormigueos o se le enfran y se tornan de research officer, trade union. Mantenga limpio el dispositivo ortopdico. Si el dispositivo ortopdico no es impermeable: No deje que se moje. Cbralo con un envoltorio hermtico cuando  tome un bao de inmersin o una ducha. Control del dolor, la rigidez y la hinchazn  Aplique hielo sobre la zona lesionada si se lo indican. Si tiene un dispositivo ortopdico desmontable, quteselo como se lo haya indicado el mdico. Ponga el hielo en una bolsa plstica. Coloque una toalla entre la piel y la bolsa. Aplique el hielo durante 20 minutos, 2 a 3 veces por da. Si la piel se le pone de color rojo brillante, retire el hielo de inmediato para evitar daos en la piel. El [place] de dao es mayor si no puede sentir dolor, airline pilot o fro. Mueva los dedos de la mano con frecuencia para reducir la rigidez y la hinchazn. Cuando est sentado o acostado, levante (eleve) la zona lesionada por encima del nivel del corazn. Instrucciones generales [address] [employer] ejercicios o la fisioterapia como se lo haya indicado el mdico. Si le indicaron que use una codera o un dispositivo ortopdico, selo como se lo haya indicado el mdico. Concurra a todas las visitas de seguimiento. El mdico controlar si los sntomas mejoran. Tambin puede modificar el tratamiento si es necesario. Comunquese con un mdico si: Sus sntomas empeoran. Los sntomas no mejoran con scientist, research (medical). Siente un dolor nuevo. Siente fro o adormecimiento en la mano del lado lesionado. Esta informacin  no tiene theme park manager el consejo del mdico. Asegrese de hacerle al mdico cualquier pregunta que tenga. Document Revised: 01/05/2022 Document Reviewed: 01/05/2022 Elsevier Patient Education  2024 Elsevier Inc.   Peripheral Neuropathy Peripheral neuropathy is a type of nerve damage. It affects nerves that carry signals between the spinal cord and the arms, legs, and the rest of the body (peripheral nerves). It does not affect nerves in the spinal cord or brain. In peripheral neuropathy, one nerve or a group of nerves may be damaged. Peripheral neuropathy is a broad category that includes many specific nerve disorders, like  diabetic neuropathy, hereditary neuropathy, and carpal tunnel syndrome. What are the causes? This condition may be caused by: Certain diseases, such as: Diabetes. This is the most common cause of peripheral neuropathy. Autoimmune diseases, such as rheumatoid arthritis and systemic lupus erythematosus. Nerve diseases that are passed from parent to child (inherited). Kidney disease. Thyroid  disease. Other causes may include: Nerve injury. Pressure or stress on a nerve that lasts a long time. Lack (deficiency) of B vitamins. This can result from alcoholism, poor diet, or a restricted diet. Infections. Some medicines, such as cancer medicines (chemotherapy). Poisonous (toxic) substances, such as lead and mercury. Too little blood flowing to the legs. In some cases, the cause of this condition is not known. What are the signs or symptoms? Symptoms of this condition depend on which of your nerves is damaged. Symptoms in the legs, hands, and arms can include: Loss of feeling (numbness) in the feet, hands, or both. Tingling in the feet, hands, or both. Burning pain. Very sensitive skin. Weakness. Not being able to move a part of the body (paralysis). Clumsiness or poor coordination. Muscle twitching. Loss of balance. Symptoms in other parts of the body can include: Not being able to control your bladder. Feeling dizzy. Sexual problems. How is this diagnosed? Diagnosing and finding the cause of peripheral neuropathy can be difficult. Your health care provider will take your medical history and do a physical exam. A neurological exam will also be done. This involves checking things that are affected by your brain, spinal cord, and nerves (nervous system). For example, your health care provider will check your reflexes, how you move, and what you can feel. You may have other tests, such as: Blood tests. Electromyogram (EMG) and nerve conduction tests. These tests check nerve function and  how well the nerves are controlling the muscles. Imaging tests, such as a CT scan or MRI, to rule out other causes of your symptoms. Removing a small piece of nerve to be examined in a lab (nerve biopsy). Removing and examining a small amount of the fluid that surrounds the brain and spinal cord (lumbar puncture). How is this treated? Treatment for this condition may involve: Treating the underlying cause of the neuropathy, such as diabetes, kidney disease, or vitamin deficiencies. Stopping medicines that can cause neuropathy, such as chemotherapy. Medicine to help relieve pain. Medicines may include: Prescription or over-the-counter pain medicine. Anti-seizure medicine. Antidepressants. Pain-relieving patches that are applied to painful areas of skin. Surgery to relieve pressure on a nerve or to destroy a nerve that is causing pain. Physical therapy to help improve movement and balance. Devices to help you move around (assistive devices). Follow these instructions at home: Medicines Take over-the-counter and prescription medicines only as told by your health care provider. Do not take any other medicines without first asking your health care provider. Ask your health care provider if the medicine prescribed to you  requires you to avoid driving or using machinery. Lifestyle  Do not use any products that contain nicotine  or tobacco. These products include cigarettes, chewing tobacco, and vaping devices, such as e-cigarettes. Smoking keeps blood from reaching damaged nerves. If you need help quitting, ask your health care provider. Avoid or limit alcohol. Too much alcohol can cause a vitamin B deficiency, and vitamin B is needed for healthy nerves. Eat a healthy diet. This includes: Eating foods that are high in fiber, such as beans, whole grains, and fresh fruits and vegetables. Limiting foods that are high in fat and processed sugars, such as fried or sweet foods. General  instructions  If you have diabetes, work closely with your health care provider to keep your blood sugar under control. If you have numbness in your feet: Check every day for signs of injury or infection. Watch for redness, warmth, and swelling. Wear padded socks and comfortable shoes. These help protect your feet. Develop a good support system. Living with peripheral neuropathy can be stressful. Consider talking with a mental health specialist or joining a support group. Use assistive devices and attend physical therapy as told by your health care provider. This may include using a walker or a cane. Keep all follow-up visits. This is important. Where to find more information [employer] of Neurological Disorders: toledoautomobile.co.uk Contact a health care provider if: You have new signs or symptoms of peripheral neuropathy. You are struggling emotionally from dealing with peripheral neuropathy. Your pain is not well controlled. Get help right away if: You have an injury or infection that is not healing normally. You develop new weakness in an arm or leg. You have fallen or do so frequently. Summary Peripheral neuropathy is when the nerves in the arms or legs are damaged, resulting in numbness, weakness, or pain. There are many causes of peripheral neuropathy, including diabetes, pinched nerves, vitamin deficiencies, autoimmune disease, and hereditary conditions. Diagnosing and finding the cause of peripheral neuropathy can be difficult. Your health care provider will take your medical history, do a physical exam, and do tests, including blood tests and nerve function tests. Treatment involves treating the underlying cause of the neuropathy and taking medicines to help control pain. Physical therapy and assistive devices may also help. This information is not intended to replace advice given to you by your health care provider. Make sure you discuss any questions you have with your health  care provider. Document Revised: 09/10/2020 Document Reviewed: 09/10/2020 Elsevier Patient Education  2024 [employer].

## 2023-02-04 NOTE — Addendum Note (Signed)
Addended by: Naomie Dean B on: 02/04/2023 10:25 PM   Modules accepted: Level of Service

## 2023-02-08 ENCOUNTER — Telehealth: Payer: Self-pay | Admitting: Neurology

## 2023-02-08 ENCOUNTER — Encounter: Payer: Self-pay | Admitting: Neurology

## 2023-02-08 NOTE — Telephone Encounter (Signed)
Referral for hand surgery fax to [facility] [place]. Phone: [phone], Fax: [phone].

## 2023-02-09 ENCOUNTER — Telehealth: Payer: Self-pay | Admitting: *Deleted

## 2023-02-09 NOTE — Telephone Encounter (Signed)
-----   Message from Anson Fret sent at 02/08/2023  2:14 PM EST ----- Pod 4: can you call her please, I think this patient would necessitate a call and to answer any questions she may have. She folate deficiency which can be a cause of neuropathy in the hands and feet. She has significantly abnormal Vitamin D (8.3). she should buy Over the Counter medications 1000-2000 International units of vitamin D daily. Also can take of vitamin B1 daily. Still pending a few labs and will contact her if abnormal.(FYI Dr. Rosaura Carpenter).

## 2023-02-09 NOTE — Telephone Encounter (Signed)
I called the pt and discussed her lab results as noted below by Dr Lucia Gaskins. She verbalized understanding, wrote down and read back to me the correct recommended supplements (Vit D & B1) and their dosages back to me. Her questions were answered and she verbalized appreciation for the call. I have asked her to follow-up with her primary care as well.

## 2023-02-12 LAB — MULTIPLE MYELOMA PANEL, SERUM
Albumin SerPl Elph-Mcnc: 4.1 g/dL (ref 2.9–4.4)
Albumin/Glob SerPl: 1.4 (ref 0.7–1.7)
Alpha 1: 0.3 g/dL (ref 0.0–0.4)
Alpha2 Glob SerPl Elph-Mcnc: 0.7 g/dL (ref 0.4–1.0)
B-Globulin SerPl Elph-Mcnc: 1.1 g/dL (ref 0.7–1.3)
Gamma Glob SerPl Elph-Mcnc: 0.9 g/dL (ref 0.4–1.8)
Globulin, Total: 3 g/dL (ref 2.2–3.9)
IgA/Immunoglobulin A, Serum: 51 mg/dL — ABNORMAL LOW (ref 87–352)
IgG (Immunoglobin G), Serum: 927 mg/dL (ref 586–1602)
IgM (Immunoglobulin M), Srm: 83 mg/dL (ref 26–217)
Total Protein: 7.1 g/dL (ref 6.0–8.5)

## 2023-02-12 LAB — B12 AND FOLATE PANEL
Folate: 2 ng/mL — ABNORMAL LOW (ref >3.0)
Vitamin B-12: 535 pg/mL (ref 232–1245)

## 2023-02-12 LAB — GENESEQ PLUS, TTR

## 2023-02-12 LAB — SEDIMENTATION RATE: Sed Rate: 4 mm/h (ref 0–40)

## 2023-02-12 LAB — HIV ANTIBODY (ROUTINE TESTING W REFLEX): HIV Screen 4th Generation wRfx: NONREACTIVE

## 2023-02-12 LAB — HEAVY METALS, BLOOD
Arsenic: 2 ug/L (ref 0–9)
Lead, Blood: 4 ug/dL — ABNORMAL HIGH (ref 0.0–3.4)
Mercury: <1 ug/L (ref 0.0–14.9)

## 2023-02-12 LAB — ANGIOTENSIN CONVERTING ENZYME: Angio Convert Enzyme: 32 U/L (ref 14–82)

## 2023-02-12 LAB — GLIADIN ANTIBODIES, SERUM
Antigliadin Abs, IgA: 4 U (ref 0–19)
Gliadin IgG: 3 U (ref 0–19)

## 2023-02-12 LAB — SJOGREN'S SYNDROME ANTIBODS(SSA + SSB)
ENA SSA (RO) Ab: <0.2 AI (ref 0.0–0.9)
ENA SSB (LA) Ab: <0.2 AI (ref 0.0–0.9)

## 2023-02-12 LAB — RHEUMATOID FACTOR: Rheumatoid fact SerPl-aCnc: 11.4 [IU]/mL (ref <14.0)

## 2023-02-12 LAB — HEMOGLOBIN A1C
Est. average glucose Bld gHb Est-mCnc: 105 mg/dL
Hgb A1c MFr Bld: 5.3 % (ref 4.8–5.6)

## 2023-02-12 LAB — VITAMIN D 25 HYDROXY (VIT D DEFICIENCY, FRACTURES): Vit D, 25-Hydroxy: 8.3 ng/mL — ABNORMAL LOW (ref 30.0–100.0)

## 2023-02-12 LAB — ANTINUCLEAR ANTIBODIES, IFA: ANA Titer 1: NEGATIVE

## 2023-02-12 LAB — VITAMIN B1: Thiamine: 90 nmol/L (ref 66.5–200.0)

## 2023-02-12 LAB — TISSUE TRANSGLUTAMINASE, IGA: Transglutaminase IgA: <2 U/mL (ref 0–3)

## 2023-02-12 LAB — METHYLMALONIC ACID, SERUM: Methylmalonic Acid: 152 nmol/L (ref 0–378)

## 2023-02-12 LAB — RPR: RPR Ser Ql: NONREACTIVE

## 2023-02-12 LAB — HEPATITIS C ANTIBODY: Hep C Virus Ab: NONREACTIVE

## 2023-02-12 LAB — TSH: TSH: 1.12 u[IU]/mL (ref 0.450–4.500)

## 2023-02-12 LAB — VITAMIN B6: Vitamin B6: 3.6 ug/L (ref 3.4–65.2)

## 2023-07-05 ENCOUNTER — Encounter: Payer: Self-pay | Admitting: *Deleted

## 2023-10-06 ENCOUNTER — Encounter: Payer: Self-pay | Admitting: Student

## 2023-10-07 ENCOUNTER — Ambulatory Visit: Payer: MEDICAID | Admitting: Student

## 2023-10-07 VITALS — BP 119/89 | HR 83 | Temp 98.4°F | Ht 64.0 in | Wt 99.6 lb

## 2023-10-07 DIAGNOSIS — R911 Solitary pulmonary nodule: Secondary | ICD-10-CM

## 2023-10-07 DIAGNOSIS — F17211 Nicotine dependence, cigarettes, in remission: Secondary | ICD-10-CM

## 2023-10-07 DIAGNOSIS — K219 Gastro-esophageal reflux disease without esophagitis: Secondary | ICD-10-CM | POA: Diagnosis not present

## 2023-10-07 DIAGNOSIS — R630 Anorexia: Secondary | ICD-10-CM

## 2023-10-07 DIAGNOSIS — E538 Deficiency of other specified B group vitamins: Secondary | ICD-10-CM

## 2023-10-07 DIAGNOSIS — E559 Vitamin D deficiency, unspecified: Secondary | ICD-10-CM

## 2023-10-07 DIAGNOSIS — F32A Depression, unspecified: Secondary | ICD-10-CM | POA: Diagnosis not present

## 2023-10-07 DIAGNOSIS — R634 Abnormal weight loss: Secondary | ICD-10-CM

## 2023-10-07 DIAGNOSIS — Z87891 Personal history of nicotine dependence: Secondary | ICD-10-CM | POA: Diagnosis not present

## 2023-10-07 DIAGNOSIS — Z79899 Other long term (current) drug therapy: Secondary | ICD-10-CM

## 2023-10-07 DIAGNOSIS — D7589 Other specified diseases of blood and blood-forming organs: Secondary | ICD-10-CM

## 2023-10-07 DIAGNOSIS — K222 Esophageal obstruction: Secondary | ICD-10-CM | POA: Diagnosis not present

## 2023-10-07 DIAGNOSIS — R1310 Dysphagia, unspecified: Secondary | ICD-10-CM

## 2023-10-07 DIAGNOSIS — Z13220 Encounter for screening for lipoid disorders: Secondary | ICD-10-CM

## 2023-10-07 DIAGNOSIS — F323 Major depressive disorder, single episode, severe with psychotic features: Secondary | ICD-10-CM

## 2023-10-07 MED ORDER — PANTOPRAZOLE SODIUM 20 MG PO TBEC: 20.0000 mg | DELAYED_RELEASE_TABLET | Freq: Every day | ORAL | 30 refills | Status: DC

## 2023-10-07 NOTE — Progress Notes (Signed)
 Subjective:  CC: Follow up  HPI:  [name] is a 58 y.o. female with a past medical history stated below and presents today for weight loss, lung cancer screening, dysphagia. Please see problem based assessment and plan for additional details.  Past Medical History:  Diagnosis Date   ALLERGIC RHINITIS 10/10/2009   Qualifier: Diagnosis of  By: Stephanie Padilla, Shelly     CANNABIS ABUSE 04/17/2008   Qualifier: Diagnosis of  By: Tanda  Padilla, Stephanie Padilla     Chronic abdominal pain    Chronic Abd distension / bloating. W/U includes CT ABD & pelvis 7/08 negative. EGD 5/09 Dr Stephanie Padilla :erythematous gastric mucosa and bx c/w chronic gastritis - was neg for H pylori. Duo polyp had path c/w peptic duodenitis   Depression    DEPRESSION 08/05/2006   Qualifier: Diagnosis of  By: Tanda  Padilla, Stephanie Padilla     Depression    GERD 08/05/2006   Qualifier: Diagnosis of  By: Tanda  Padilla, Stephanie Padilla, unspecified 10/16/2009   Qualifier: Diagnosis of  By: Stephanie Padilla, Shelly     Hemorrhoids 01/18/2012   Hernia of abdominal cavity    INSOMNIA 10/15/2008   Qualifier: Diagnosis of  By: Tanda  Padilla, Stephanie Padilla     Irritable bowel syndrome 08/06/2009   Qualifier: Diagnosis of  By: Stephanie Padilla     NICOTINE  ADDICTION 07/06/2008   Started on Chantix .     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     HYDROcodone -acetaminophen  (NORCO/VICODIN) 5-325 MG tablet Take 1 tablet by mouth 4 (four) times daily.     mirtazapine  (REMERON ) 7.5 MG tablet Take 7.5 mg by mouth at bedtime.     OLANZapine  (ZYPREXA ) 20 MG tablet Take 20 mg by mouth at bedtime.     No current facility-administered medications on file prior to visit.    Family History  Problem Relation Age of Onset   Cancer  Mother 26       presumed breast ca   Colon cancer Maternal Grandmother    Cancer Paternal Grandmother        colon cancer   Cancer - Colon Other        Uncle    Esophageal cancer Neg Hx    Stomach cancer Neg Hx    Rectal cancer Neg Hx    Neuropathy Neg Hx     Social History   Socioeconomic History   Marital status: Single    Spouse name: Not on file   Number of children: Not on file   Years of education: Not on file   Highest education level: Not on file  Occupational History   Not on file  Tobacco Use   Smoking status: Former    Current packs/day: 0.00    Types: Cigarettes    Quit date: 10/28/2019    Years since quitting: 3.9   Smokeless tobacco: Never   Tobacco comments:    6 cigs/day  Vaping Use   Vaping status: Never Used  Substance and Sexual Activity   Alcohol use: No    Alcohol/week: 0.0 standard drinks of alcohol   Drug use: Not Currently    Comment: Marijuana.; former   Sexual activity: Not Currently  Birth control/protection: None  Other Topics Concern   Not on file  Social History Narrative   Lives with son. 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 Has medicaid. Got GED. Smoked cigs 10-15 yrs 1PPD> Restarted 3 months in 2011 but quit after 3 months.      Update 02/02/2023   Lives alone    No longer employed   Social Drivers of Health   Financial Resource Strain: Not on file  Food Insecurity: Not on file  Transportation Needs: Not on file  Physical Activity: Not on file  Stress: Not on file  Social Connections: Not on file  Intimate Partner Violence: Not on file    Review of Systems: ROS negative except for what is noted on the assessment and plan.  Objective:   Vitals:   10/07/23 0810  BP: 119/89  Pulse: 83  Temp: 98.4 F (36.9 C)  TempSrc: Oral  SpO2: 100%  Weight: 99 lb 9.6 oz (45.2 kg)  Height: 5' 4 (1.626 m)    Physical Exam: Constitutional: well-appearing thin woman sitting in chair, in no acute distress HENT:  normocephalic atraumatic, mucous membranes moist Eyes: conjunctiva non-erythematous Neck: supple Cardiovascular: regular rate and rhythm, no m/r/g Pulmonary/Chest: normal work of breathing on room air, lungs clear to auscultation bilaterally Abdominal: soft, non-tender, non-distended MSK: decreased bulk , no lower extremity edema Neurological: alert & oriented x 3, normal gait Skin: warm and dry Psych: Pleasant mood and affect     Assessment & Plan:   History of tobacco abuse In early remission for about 4 months. (10 cigarettes per day). The smell irriated her and she just decided to stop. No current cravings. Low dose CT scan ordered.  Depression Managed by [facility]. Last Televisit 2 days ago.   Esophageal stricture Patient has had recurrence and slow worsening of dysphagia with solids just as when  she was first diagnosed with esophageal strictures. Has lost ~14 pounds since January. Has had some GERD symptoms, not currently on PPI. - Urgent referral to GI - Restart PPI  Weight loss Ongoing. Suspect secondary to esophageal strictures. Will continue with age and risk factor appropriate cancer screenings. Will screen for vitamin deficiencies during this visit    Return in about 3 months (around 01/06/2024) for 3 months to follow up for pap smear, weight loss follow up.  Patient discussed with Dr. Shawn Hadassah Kristy Rosario, Padilla [facility]

## 2023-10-07 NOTE — Assessment & Plan Note (Signed)
 Managed by [employer]. Last Televisit 2 days ago.

## 2023-10-07 NOTE — Patient Instructions (Signed)
 Thank you, [name] for allowing us  to provide your care today. Today we discussed  Your weight loss Your screening for lung cancer because of your history of smoking. CONGRATULATIONS on stopping smoking!     Referrals: - Stomach doctor so that we can find out more if you need the dilation for your stricture - CT chest to follow up on your lung nodule  New medications: - restart your pantropazole - acid reflux   I have ordered the following labs for you:   Lab Orders         Vitamin D  (25 hydroxy)         Lipid Profile         Comprehensive metabolic panel with GFR         CBC with Diff         B12 and Folate Panel      I will call if any are abnormal. All of your labs can be accessed through My Chart.   My Chart Access: [email or web address]  Please follow-up in: 3 months to follow up for pap smear, weight loss follow up    We look forward to seeing you next time. Please call our clinic at [phone] if you have any questions or concerns. The best time to call is Monday-Friday from 9am-4pm, but there is someone available 24/7. If after hours or the weekend, call the main hospital number and ask for the Internal Medicine Resident On-Call. If you need medication refills, please notify your pharmacy one week in advance and they will send us  a request.   Thank you for letting us  take part in your care. Wishing you the best!  Elnora Ip, MD 10/07/2023, 8:55 AM [facility]

## 2023-10-07 NOTE — Assessment & Plan Note (Addendum)
 In early remission for about 4 months. (10 cigarettes per day). The smell irriated her and she just decided to stop. No current cravings. Low dose CT scan ordered.

## 2023-10-08 LAB — CBC WITH DIFFERENTIAL/PLATELET
Basophils Absolute: 0 x10E3/uL (ref 0.0–0.2)
Basos: 0 %
EOS (ABSOLUTE): 0.2 x10E3/uL (ref 0.0–0.4)
Eos: 2 %
Hematocrit: 44.4 % (ref 34.0–46.6)
Hemoglobin: 14.7 g/dL (ref 11.1–15.9)
Immature Grans (Abs): 0 x10E3/uL (ref 0.0–0.1)
Immature Granulocytes: 0 %
Lymphocytes Absolute: 1.5 x10E3/uL (ref 0.7–3.1)
Lymphs: 23 %
MCH: 34.4 pg — ABNORMAL HIGH (ref 26.6–33.0)
MCHC: 33.1 g/dL (ref 31.5–35.7)
MCV: 104 fL — ABNORMAL HIGH (ref 79–97)
Monocytes Absolute: 0.4 x10E3/uL (ref 0.1–0.9)
Monocytes: 5 %
Neutrophils Absolute: 4.7 x10E3/uL (ref 1.4–7.0)
Neutrophils: 70 %
Platelets: 423 x10E3/uL (ref 150–450)
RBC: 4.27 x10E6/uL (ref 3.77–5.28)
RDW: 15.7 % — ABNORMAL HIGH (ref 11.7–15.4)
WBC: 6.8 x10E3/uL (ref 3.4–10.8)

## 2023-10-08 LAB — LIPID PANEL
Chol/HDL Ratio: 2.4 ratio (ref 0.0–4.4)
Cholesterol, Total: 213 mg/dL — ABNORMAL HIGH (ref 100–199)
HDL: 90 mg/dL (ref >39)
LDL Chol Calc (NIH): 98 mg/dL (ref 0–99)
Triglycerides: 146 mg/dL (ref 0–149)
VLDL Cholesterol Cal: 25 mg/dL (ref 5–40)

## 2023-10-08 LAB — VITAMIN D 25 HYDROXY (VIT D DEFICIENCY, FRACTURES): Vit D, 25-Hydroxy: 10.7 ng/mL — ABNORMAL LOW (ref 30.0–100.0)

## 2023-10-08 LAB — COMPREHENSIVE METABOLIC PANEL WITH GFR
ALT: 6 IU/L (ref 0–32)
AST: 14 IU/L (ref 0–40)
Albumin: 4.1 g/dL (ref 3.8–4.9)
Alkaline Phosphatase: 113 IU/L (ref 49–135)
BUN/Creatinine Ratio: 5 — ABNORMAL LOW (ref 9–23)
BUN: 3 mg/dL — ABNORMAL LOW (ref 6–24)
Bilirubin Total: 0.4 mg/dL (ref 0.0–1.2)
CO2: 23 mmol/L (ref 20–29)
Calcium: 9.6 mg/dL (ref 8.7–10.2)
Chloride: 101 mmol/L (ref 96–106)
Creatinine, Ser: 0.57 mg/dL (ref 0.57–1.00)
Globulin, Total: 2.5 g/dL (ref 1.5–4.5)
Glucose: 89 mg/dL (ref 70–99)
Potassium: 3.4 mmol/L — ABNORMAL LOW (ref 3.5–5.2)
Sodium: 142 mmol/L (ref 134–144)
Total Protein: 6.6 g/dL (ref 6.0–8.5)
eGFR: 107 mL/min/1.73 (ref >59)

## 2023-10-08 LAB — B12 AND FOLATE PANEL
Folate: <2 ng/mL — ABNORMAL LOW (ref >3.0)
Vitamin B-12: 513 pg/mL (ref 232–1245)

## 2023-10-09 ENCOUNTER — Encounter: Payer: Self-pay | Admitting: Student

## 2023-10-09 MED ORDER — VITAMIN D (ERGOCALCIFEROL) 1.25 MG (50000 UNIT) PO CAPS: 50000.0000 [IU] | ORAL_CAPSULE | ORAL | 0 refills | Status: AC

## 2023-10-09 MED ORDER — FOLIC ACID 1 MG PO TABS: 1.0000 mg | ORAL_TABLET | Freq: Every day | ORAL | 3 refills | Status: AC

## 2023-10-09 NOTE — Assessment & Plan Note (Signed)
 Ongoing. Suspect secondary to esophageal strictures. Will continue with age and risk factor appropriate cancer screenings. Will screen for vitamin deficiencies during this visit

## 2023-10-09 NOTE — Assessment & Plan Note (Signed)
 Patient has had recurrence and slow worsening of dysphagia with solids just as when  she was first diagnosed with esophageal strictures. Has lost ~14 pounds since January. Has had some GERD symptoms, not currently on PPI. - Urgent referral to GI - Restart PPI

## 2023-10-11 ENCOUNTER — Ambulatory Visit: Payer: Self-pay | Admitting: Student

## 2023-10-11 NOTE — Progress Notes (Signed)
 Discussed lab results with the patient. Advised eating K rich foods and to pick up prescriptions for Vit D and Folate. Will need to have follow up labs in ~ 2 months.

## 2023-10-18 NOTE — Progress Notes (Signed)
 Internal Medicine Clinic Attending  Case discussed with the resident at the time of the visit.  We reviewed the resident's history and exam and pertinent patient test results.  I agree with the assessment, diagnosis, and plan of care documented in the resident's note.

## 2023-11-03 ENCOUNTER — Ambulatory Visit: Payer: MEDICAID | Admitting: Gastroenterology

## 2023-11-17 ENCOUNTER — Other Ambulatory Visit: Payer: Self-pay

## 2023-11-17 DIAGNOSIS — Z1231 Encounter for screening mammogram for malignant neoplasm of breast: Secondary | ICD-10-CM

## 2023-11-24 ENCOUNTER — Ambulatory Visit (HOSPITAL_COMMUNITY)
Admission: RE | Admit: 2023-11-24 | Discharge: 2023-11-24 | Disposition: A | Discharge status: Home / Self Care | Payer: MEDICAID | Source: Ambulatory Visit | Attending: Family Medicine | Admitting: Family Medicine

## 2023-11-24 DIAGNOSIS — R911 Solitary pulmonary nodule: Secondary | ICD-10-CM | POA: Insufficient documentation

## 2023-11-24 DIAGNOSIS — Z87891 Personal history of nicotine dependence: Secondary | ICD-10-CM | POA: Diagnosis present

## 2023-11-24 DIAGNOSIS — R634 Abnormal weight loss: Secondary | ICD-10-CM | POA: Insufficient documentation

## 2023-12-13 ENCOUNTER — Ambulatory Visit
Admission: RE | Admit: 2023-12-13 | Discharge: 2023-12-13 | Disposition: A | Payer: MEDICAID | Source: Ambulatory Visit | Attending: Internal Medicine | Admitting: Internal Medicine

## 2023-12-13 DIAGNOSIS — Z1231 Encounter for screening mammogram for malignant neoplasm of breast: Secondary | ICD-10-CM

## 2023-12-21 ENCOUNTER — Encounter: Payer: Self-pay | Admitting: Gastroenterology

## 2023-12-21 ENCOUNTER — Ambulatory Visit: Payer: MEDICAID | Admitting: Gastroenterology

## 2023-12-21 VITALS — BP 110/58 | HR 98 | Ht 64.0 in | Wt 104.5 lb

## 2023-12-21 DIAGNOSIS — J439 Emphysema, unspecified: Secondary | ICD-10-CM

## 2023-12-21 DIAGNOSIS — K219 Gastro-esophageal reflux disease without esophagitis: Secondary | ICD-10-CM | POA: Diagnosis not present

## 2023-12-21 DIAGNOSIS — K222 Esophageal obstruction: Secondary | ICD-10-CM | POA: Diagnosis not present

## 2023-12-21 DIAGNOSIS — K648 Other hemorrhoids: Secondary | ICD-10-CM

## 2023-12-21 DIAGNOSIS — R630 Anorexia: Secondary | ICD-10-CM

## 2023-12-21 DIAGNOSIS — K5909 Other constipation: Secondary | ICD-10-CM

## 2023-12-21 DIAGNOSIS — E538 Deficiency of other specified B group vitamins: Secondary | ICD-10-CM

## 2023-12-21 DIAGNOSIS — R634 Abnormal weight loss: Secondary | ICD-10-CM | POA: Diagnosis not present

## 2023-12-21 DIAGNOSIS — R1310 Dysphagia, unspecified: Secondary | ICD-10-CM | POA: Diagnosis not present

## 2023-12-21 DIAGNOSIS — E559 Vitamin D deficiency, unspecified: Secondary | ICD-10-CM

## 2023-12-21 MED ORDER — HYDROCORTISONE ACETATE 25 MG RE SUPP: 25.0000 mg | Freq: Every evening | RECTAL | 1 refills | Status: AC

## 2023-12-21 NOTE — Patient Instructions (Addendum)
 GERD Pick up Pantoprazole  20mg  at [facility] GERD diet  Constipation High fiber diet Pick up otc Miralax po daily , take daily titrate as needed.  Hemorrhoids Suppository 1 at bedtime   Weight loss Dysphagia precautions  Recommend protein shake once to twice daily   Folate deficiency Folic acid - at pharmacy  Vitamin D  deficiency  Vitamin D - picked up  Patient with some wheezing and cough- recommend following up with PCP.  You have been scheduled for an Endoscopy. Please follow written instructions given to you at your visit today.  If you use inhalers (even only as needed), please bring them with you on the day of your procedure.  If you take any of the following medications, they will need to be adjusted prior to your procedure:   DO NOT TAKE 7 DAYS PRIOR TO TEST- Trulicity (dulaglutide) Ozempic, Wegovy (semaglutide) Mounjaro (tirzepatide) Bydureon Bcise (exanatide extended release)  DO NOT TAKE 1 DAY PRIOR TO YOUR TEST Rybelsus (semaglutide) Adlyxin (lixisenatide) Victoza (liraglutide) Byetta (exanatide) ___________________________________________________________________________  Please follow up sooner if symptoms increase or worsen  Due to recent changes in healthcare laws, you may see the results of your imaging and laboratory studies on MyChart before your provider has had a chance to review them.  We understand that in some cases there may be results that are confusing or concerning to you. Not all laboratory results come back in the same time frame and the provider may be waiting for multiple results in order to interpret others.  Please give us  48 hours in order for your provider to thoroughly review all the results before contacting the office for clarification of your results.   Thank you for trusting me with your gastrointestinal care!  _______________________________________________________  If your blood pressure at your visit was 140/90 or greater,  please contact your primary care physician to follow up on this.  _______________________________________________________  If you are age 32 or older, your body mass index should be between 23-30. Your Body mass index is 17.94 kg/m. If this is out of the aforementioned range listed, please consider follow up with your Primary Care Provider.  If you are age 34 or younger, your body mass index should be between 19-25. Your Body mass index is 17.94 kg/m. If this is out of the aformentioned range listed, please consider follow up with your Primary Care Provider.   ________________________________________________________  The [facility] providers would like to encourage you to use MYCHART to communicate with providers for non-urgent requests or questions.  Due to long hold times on the telephone, sending your provider a message by [facility] may be a faster and more efficient way to get a response.  Please allow 48 business hours for a response.  Please remember that this is for non-urgent requests.  _______________________________________________________
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
## 2023-12-29 ENCOUNTER — Encounter: Payer: Self-pay | Admitting: Gastroenterology

## 2023-12-29 ENCOUNTER — Ambulatory Visit: Payer: MEDICAID | Admitting: Gastroenterology

## 2023-12-29 VITALS — BP 129/85 | HR 81 | Temp 97.2°F | Resp 18 | Ht 64.0 in | Wt 104.0 lb

## 2023-12-29 DIAGNOSIS — K2100 Gastro-esophageal reflux disease with esophagitis, without bleeding: Secondary | ICD-10-CM | POA: Diagnosis not present

## 2023-12-29 DIAGNOSIS — R1319 Other dysphagia: Secondary | ICD-10-CM

## 2023-12-29 DIAGNOSIS — K222 Esophageal obstruction: Secondary | ICD-10-CM

## 2023-12-29 MED ORDER — SODIUM CHLORIDE 0.9 % IV SOLN: 500.0000 mL | Freq: Once | INTRAVENOUS | Status: DC

## 2023-12-29 NOTE — Progress Notes (Signed)
 Agree with the assessment and plan as outlined by [facility], FNP-C.  Carlitos Bottino, DO, [facility]

## 2023-12-29 NOTE — Patient Instructions (Signed)
 Please read handouts provided. Continue present medications. Soft diet today, advance as tolerated tomorrow. Use Protonix  ( pantoprazole  ) 20 mg twice daily for 4 weeks. If reflux symptoms controlled, can reduce back to 20 mg daily. Repeat upper endoscopy as needed for treatment.  YOU HAD AN ENDOSCOPIC PROCEDURE TODAY AT THE [facility]:   Refer to the procedure report that was given to you for any specific questions about what was found during the examination.  If the procedure report does not answer your questions, please call your gastroenterologist to clarify.  If you requested that your care partner not be given the details of your procedure findings, then the procedure report has been included in a sealed envelope for you to review at your convenience later.  YOU SHOULD EXPECT: Some feelings of bloating in the abdomen. Passage of more gas than usual.  Walking can help get rid of the air that was put into your GI tract during the procedure and reduce the bloating. If you had a lower endoscopy (such as a colonoscopy or flexible sigmoidoscopy) you may notice spotting of blood in your stool or on the toilet paper. If you underwent a bowel prep for your procedure, you may not have a normal bowel movement for a few days.  Please Note:  You might notice some irritation and congestion in your nose or some drainage.  This is from the oxygen used during your procedure.  There is no need for concern and it should clear up in a day or so.  SYMPTOMS TO REPORT IMMEDIATELY:  Following upper endoscopy (EGD)  Vomiting of blood or coffee ground material  New chest pain or pain under the shoulder blades  Painful or persistently difficult swallowing  New shortness of breath  Fever of 100F or higher  Black, tarry-looking stools  For urgent or emergent issues, a gastroenterologist can be reached at any hour by calling [phone]. Do not use MyChart messaging for urgent concerns.    DIET:   We do recommend a small meal at first, but then you may proceed to your regular diet.  Drink plenty of fluids but you should avoid alcoholic beverages for 24 hours.  ACTIVITY:  You should plan to take it easy for the rest of today and you should NOT DRIVE or use heavy machinery until tomorrow (because of the sedation medicines used during the test).    FOLLOW UP: Our staff will call the number listed on your records the next business day following your procedure.  We will call around 7:15- 8:00 am to check on you and address any questions or concerns that you may have regarding the information given to you following your procedure. If we do not reach you, we will leave a message.     If any biopsies were taken you will be contacted by phone or by letter within the next 1-3 weeks.  Please call us  at [phone] if you have not heard about the biopsies in 3 weeks.    SIGNATURES/CONFIDENTIALITY: You and/or your care partner have signed paperwork which will be entered into your electronic medical record.  These signatures attest to the fact that that the information above on your After Visit Summary has been reviewed and is understood.  Full responsibility of the confidentiality of this discharge information lies with you and/or your care-partner.

## 2023-12-29 NOTE — Op Note (Signed)
 [facility] Patient Name: Stephanie Padilla Procedure Date: 12/29/2023 9:48 AM MRN: [id number] Endoscopist: Sandor Flatter , MD, [id number] Age: 58 Referring MD:  Date of Birth: 1966/01/14 Gender: Female Account #: [id number] Procedure:                Upper GI endoscopy Indications:              Dysphagia, Esophageal reflux, Weight loss                           Last EGD was 12/2020 and notable for stricture at                            16 cm from the incisors dilated with 16 mm Savary                            dilator, benign gastric inlet patch, nonobstructing                            Schatzki's ring in the lower esophagus dilated with                            16 mm Savary dilator then fractured with forceps,                            mild gastritis (path benign). Did well with last                            endoscopy with dilation, but slow recurrence of                            solid food dysphagia over the last year or so. Medicines:                Monitored Anesthesia Care Procedure:                Pre-Anesthesia Assessment:                           - Prior to the procedure, a History and Physical                            was performed, and patient medications and                            allergies were reviewed. The patient's tolerance of                            previous anesthesia was also reviewed. The risks                            and benefits of the procedure and the sedation                            options and risks were discussed with the patient.  All questions were answered, and informed consent                            was obtained. Prior Anticoagulants: The patient has                            taken no anticoagulant or antiplatelet agents. ASA                            Grade Assessment: II - A patient with mild systemic                            disease. After reviewing the risks and benefits,                             the patient was deemed in satisfactory condition to                            undergo the procedure.                           After obtaining informed consent, the endoscope was                            passed under direct vision. Throughout the                            procedure, the patient's blood pressure, pulse, and                            oxygen saturations were monitored continuously. The                            Endoscope was introduced through the mouth, and                            advanced to the second part of duodenum. The upper                            GI endoscopy was accomplished without difficulty.                            The patient tolerated the procedure well. Scope In: Scope Out: Findings:                 Two benign-appearing, intrinsic mild stenoses were                            found 16 cm from the incisors. The narrowest                            stenosis measured 1 cm (in length). The stenoses  were traversed. A guidewire was placed and the                            scope was withdrawn. Dilation was performed with a                            Savary dilator with no resistance at 15 mm and mild                            resistance at 17 mm. The dilation site was examined                            following endoscope reinsertion and showed mild                            mucosal disruption and moderate improvement in                            luminal narrowing. Estimated blood loss was minimal.                           LA Grade A (one or more mucosal breaks less than 5                            mm, not extending between tops of 2 mucosal folds)                            esophagitis with no bleeding was found 36 cm from                            the incisors.                           The entire examined stomach was normal.                           The examined duodenum was  normal. Complications:            No immediate complications. Estimated Blood Loss:     Estimated blood loss was minimal. Impression:               - Benign-appearing esophageal stenoses. Dilated                            with 15 mm Savary then 17 mm Savary dilator with                            mucosal disruption x 2 consistent with successful                            dilation.                           - LA Grade A reflux esophagitis with no bleeding.                           -  Normal stomach.                           - Normal examined duodenum.                           - No specimens collected. Recommendation:           - Patient has a contact number available for                            emergencies. The signs and symptoms of potential                            delayed complications were discussed with the                            patient. Return to normal activities tomorrow.                            Written discharge instructions were provided to the                            patient.                           - Soft diet today then advance slowly as tolerated                            tomorrow per postdilation protocol.                           - Use Protonix  (pantoprazole ) 20 mg PO BID for 4                            weeks to promote mucosal healing of esophagitis and                            treatment of reflux. If reflux symptoms                            well-controlled, can reduce to 20 mg daily for                            ongoing control of GERD.                           - Repeat upper endoscopy PRN for retreatment. Sandor Flatter, MD 12/29/2023 10:15:49 AM

## 2023-12-29 NOTE — Progress Notes (Signed)
 Vss nad trans to pacu

## 2023-12-29 NOTE — Progress Notes (Signed)
 Pt's states no medical or surgical changes since previsit or office visit.

## 2023-12-29 NOTE — Progress Notes (Signed)
 Called to room to assist during endoscopic procedure.  Patient ID and intended procedure confirmed with present staff. Received instructions for my participation in the procedure from the performing physician.
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
## 2023-12-30 ENCOUNTER — Telehealth: Payer: Self-pay | Admitting: *Deleted

## 2023-12-30 ENCOUNTER — Other Ambulatory Visit: Payer: Self-pay | Admitting: *Deleted

## 2023-12-30 DIAGNOSIS — K219 Gastro-esophageal reflux disease without esophagitis: Secondary | ICD-10-CM

## 2023-12-30 MED ORDER — PANTOPRAZOLE SODIUM 20 MG PO TBEC: DELAYED_RELEASE_TABLET | ORAL | 1 refills | Status: AC

## 2023-12-30 NOTE — Telephone Encounter (Signed)
°  Follow up Call-     12/29/2023    9:13 AM  Call back number  Post procedure Call Back phone  # [phone]  Permission to leave phone message Yes     Patient questions:  Do you have a fever, pain , or abdominal swelling? No. Pain Score  0 *  Have you tolerated food without any problems? Yes.    Have you been able to return to your normal activities? Yes.    Do you have any questions about your discharge instructions: Diet   No. Medications  No. Follow up visit  No.  Do you have questions or concerns about your Care? New prescription for Protonix  was never sent in to pharmacy.  I have sent prescription to preferred pharmacy and patient is aware.   Actions: * If pain score is 4 or above: No action needed, pain <4.

## 2024-01-08 ENCOUNTER — Ambulatory Visit: Payer: Self-pay
